# Patient Record
Sex: Female | Born: 1950 | Race: White | Hispanic: No | Marital: Married | Smoking: Never smoker
Health system: Southern US, Community
[De-identification: ages and names within clinical notes are randomized; demographics above are authoritative.]

## PROBLEM LIST (undated history)

## (undated) DIAGNOSIS — G4730 Sleep apnea, unspecified: Secondary | ICD-10-CM

## (undated) DIAGNOSIS — M1990 Unspecified osteoarthritis, unspecified site: Secondary | ICD-10-CM

## (undated) DIAGNOSIS — R51 Headache: Secondary | ICD-10-CM

## (undated) DIAGNOSIS — I10 Essential (primary) hypertension: Secondary | ICD-10-CM

## (undated) DIAGNOSIS — R519 Headache, unspecified: Secondary | ICD-10-CM

## (undated) DIAGNOSIS — Z8619 Personal history of other infectious and parasitic diseases: Secondary | ICD-10-CM

## (undated) HISTORY — PX: ESOPHAGOGASTRODUODENOSCOPY: SHX1529

## (undated) HISTORY — PX: OTHER SURGICAL HISTORY: SHX169

## (undated) HISTORY — PX: COLONOSCOPY: SHX174

---

## 2005-02-21 ENCOUNTER — Ambulatory Visit: Payer: Self-pay | Admitting: General Practice

## 2005-12-04 ENCOUNTER — Ambulatory Visit: Payer: Self-pay

## 2006-01-10 ENCOUNTER — Ambulatory Visit: Payer: Self-pay | Admitting: Unknown Physician Specialty

## 2006-05-07 ENCOUNTER — Ambulatory Visit: Payer: Self-pay | Admitting: Internal Medicine

## 2006-12-08 ENCOUNTER — Ambulatory Visit: Payer: Self-pay | Admitting: Internal Medicine

## 2006-12-22 ENCOUNTER — Ambulatory Visit: Payer: Self-pay | Admitting: Internal Medicine

## 2007-08-11 ENCOUNTER — Ambulatory Visit: Payer: Self-pay | Admitting: Internal Medicine

## 2007-08-18 ENCOUNTER — Ambulatory Visit: Payer: Self-pay | Admitting: Internal Medicine

## 2007-09-16 ENCOUNTER — Encounter: Payer: Self-pay | Admitting: Neurosurgery

## 2007-10-02 ENCOUNTER — Ambulatory Visit: Payer: Self-pay | Admitting: Unknown Physician Specialty

## 2007-10-11 ENCOUNTER — Encounter: Payer: Self-pay | Admitting: Neurosurgery

## 2007-12-24 ENCOUNTER — Ambulatory Visit: Payer: Self-pay | Admitting: Internal Medicine

## 2008-12-27 ENCOUNTER — Ambulatory Visit: Payer: Self-pay | Admitting: Internal Medicine

## 2009-06-27 ENCOUNTER — Ambulatory Visit: Payer: Self-pay | Admitting: Family Medicine

## 2009-11-01 ENCOUNTER — Ambulatory Visit: Payer: Self-pay

## 2009-12-08 ENCOUNTER — Ambulatory Visit: Payer: Self-pay | Admitting: General Practice

## 2009-12-22 ENCOUNTER — Ambulatory Visit: Payer: Self-pay | Admitting: General Practice

## 2010-02-13 ENCOUNTER — Ambulatory Visit: Payer: Self-pay | Admitting: Internal Medicine

## 2011-02-18 ENCOUNTER — Ambulatory Visit: Payer: Self-pay | Admitting: Internal Medicine

## 2012-02-19 ENCOUNTER — Ambulatory Visit: Payer: Self-pay | Admitting: Internal Medicine

## 2013-02-04 ENCOUNTER — Ambulatory Visit: Payer: Self-pay | Admitting: Orthopedic Surgery

## 2013-02-23 ENCOUNTER — Ambulatory Visit: Payer: Self-pay

## 2013-03-10 ENCOUNTER — Ambulatory Visit: Payer: Self-pay | Admitting: General Practice

## 2014-02-24 ENCOUNTER — Ambulatory Visit: Payer: Self-pay | Admitting: Internal Medicine

## 2014-06-21 ENCOUNTER — Ambulatory Visit (INDEPENDENT_AMBULATORY_CARE_PROVIDER_SITE_OTHER): Payer: BC Managed Care – PPO | Admitting: Podiatry

## 2014-06-21 ENCOUNTER — Encounter: Payer: Self-pay | Admitting: Podiatry

## 2014-06-21 VITALS — BP 137/81 | HR 80 | Resp 16 | Ht 63.0 in | Wt 199.0 lb

## 2014-06-21 DIAGNOSIS — L600 Ingrowing nail: Secondary | ICD-10-CM

## 2014-06-21 DIAGNOSIS — B351 Tinea unguium: Secondary | ICD-10-CM

## 2014-06-21 NOTE — Progress Notes (Signed)
   Subjective:    Patient ID: Ashley Mckay, female    DOB: [birth year], 63 y.o.   MRN: [id number]  HPI Comments: Ashley Mckay, 63 year old female, presents the office they with complaints of right great toenail pain on her left foot. She states it is his been present for many years. She states that she's been treated by her primary care physician for onychomycosis for which she is almost finished with her third month of Lamisil. She is no significance while on the Lamisil. She denies any redness or drainage from the nail site. She pain on the inside aspect of the toenail. No other complaints at this time. Denies any recent injury or trauma to the area.     Review of Systems  All other systems reviewed and are negative.      Objective:   Physical Exam AAO x3, NAD DP/PT pulses palpable bilaterally, CRT less than 3 seconds Protective sensation intact with Simms Weinstein monofilament, vibratory sensation intact, Achilles tendon reflex intact. Tenderness to palpation of the medial aspect of the left hallux toenail mostly on the distal aspect of the nail. There is mild incurvation of the medial nail border. There is no surrounding erythema or a sitting cellulitis. There is no drainage or purulence identified. There is no clinical signs of infection. Nails appear to be slightly hypertrophic, brittle and yellow discoloration along the distal aspect of the nail however there is clearing on the proximal aspect. No open lesions or pre-ulcerative lesions. No calf pain, swelling, warmth. MMT 5/5, ROM WNL       Assessment & Plan:  63 year old female with left medial border onychocryptosis and onychomycosis. -Treatment options discussed including alternatives, risks, complications. -At this time the patient elects to proceed with a slant back procedure to debride the ingrowing nail. The nail was then debrided to patient comfort without complications. Discussed the patient if this becomes symptomatic again  or if she notices any redness, drainage or other clinical signs of infection to call the office. We discussed doing a partial nail avulsion with or without chemical matricectomy. She we'll consider this at the symptoms continue. Monitor for any clinical signs or symptoms of infection and directed to call the office immediately if any are to occur or go directly to the emergency room. -Followup as needed. Call the office with any questions, concerns, change in symptoms.

## 2014-06-21 NOTE — Patient Instructions (Signed)
Ingrown Toenail An ingrown toenail occurs when the sharp edge of your toenail grows into the skin. Causes of ingrown toenails include toenails clipped too far back or poorly fitting shoes. Activities involving sudden stops (basketball, tennis) causing "toe jamming" may lead to an ingrown nail. HOME CARE INSTRUCTIONS   Soak the whole foot in warm soapy water for 20 minutes, 3 times per day.  You may lift the edge of the nail away from the sore skin by wedging a small piece of cotton under the corner of the nail. Be careful not to dig (traumatize) and cause more injury to the area.  Wear shoes that fit well. While the ingrown nail is causing problems, sandals may be beneficial.  Trim your toenails regularly and carefully. Cut your toenails straight across, not in a curve. This will prevent injury to the skin at the corners of the toenail.  Keep your feet clean and dry.  Crutches may be helpful early in treatment if walking is painful.  Antibiotics, if prescribed, should be taken as directed.  Return for a wound check in 2 days or as directed.  Only take over-the-counter or prescription medicines for pain, discomfort, or fever as directed by your caregiver. SEEK IMMEDIATE MEDICAL CARE IF:   You have a fever.  You have increasing pain, redness, swelling, or heat at the wound site.  Your toe is not better in 7 days. If conservative treatment is not successful, surgical removal of a portion or all of the nail may be necessary. MAKE SURE YOU:   Understand these instructions.  Will watch your condition.  Will get help right away if you are not doing well or get worse. Document Released: 08/23/2000 Document Revised: 11/18/2011 Document Reviewed: 08/17/2008 [employer] Patient Information 2015 [employer]. This information is not intended to replace advice given to you by your health care provider. Make sure you discuss any questions you have with your health care provider.  

## 2014-08-25 ENCOUNTER — Encounter: Payer: Self-pay | Admitting: Podiatry

## 2014-08-25 ENCOUNTER — Ambulatory Visit (INDEPENDENT_AMBULATORY_CARE_PROVIDER_SITE_OTHER): Payer: BC Managed Care – PPO | Admitting: Podiatry

## 2014-08-25 VITALS — BP 127/78 | HR 83 | Resp 16

## 2014-08-25 DIAGNOSIS — IMO0002 Reserved for concepts with insufficient information to code with codable children: Secondary | ICD-10-CM | POA: Insufficient documentation

## 2014-08-25 DIAGNOSIS — L03012 Cellulitis of left finger: Secondary | ICD-10-CM

## 2014-08-25 MED ORDER — CLINDAMYCIN HCL 300 MG PO CAPS: 300.0000 mg | ORAL_CAPSULE | Freq: Three times a day (TID) | ORAL | Status: DC

## 2014-08-25 NOTE — Patient Instructions (Signed)

## 2014-08-25 NOTE — Progress Notes (Signed)
Patient ID: Ashley Mckay, female   DOB: 12-28-1950, 63 y.o.   MRN: [id number]  Subjective: 63 year old female returns the office today for complaints of left big toe nail pain. She states that the couple days ago the area became red and she was having pain over the toenail. She denies any drainage from around the nail. She states that she has pain particularly with certain shoes and pressure over the area. The patient previously had finished a course of Lamisil and is inquiring if she should go back on it. She denies any systemic complaints as fevers, chills, nausea, vomiting. No acute changes his last appointment and no other complaints at this time.  Objective: AAO 3, NAD DP/PT pulses palpable bilaterally, CRT less than 3 seconds Protective sensation intact with Simms Weinstein monofilament, vibratory sensation intact, Achilles tendon reflex intact. There is tenderness around the medial aspect left hallux toenail with incurvation on the medial nail border. There is mild edema overlying the nail border. There is no surrounding erythema or ascending cellulitis. There is no drainage or purulence identified however see procedure note below. (Upon the procedure there was some purulence identified). Nail is hypertrophic, dystrophic, elongated, brittle distally however there some evidence of clearing along the proximal nail borders. There is slight discoloration the remaining toenails however there is no surrounding erythema or signs of infection or pain associated with the toenails.  No open lesions or pre-ulcerative lesions.  No areas of pinpoint bony tenderness or pain with vibratory sensation.  No pain with calf compression, swelling, warmth, erythema.   Assessment : 63 year old female with left medial hallux ingrown toenail/paronychia   Plan: -Treatment options were discussed with the patient including alternatives, risks, complications.  -At this time due to the discomfort recommended partial nail  avulsion. Verbal consent was obtained. Under sterile conditions a total of 2 mL of a one-to-one mixture of 2% lidocaine plain and 0.5% Marcaine plain was infiltrated in a hallux block fashion on the left hallux. Once anesthetized the skin was then prepped in sterile fashion. Next the medial border of the left hallux nail was then sharply excised inserts remove the entire offending nail border. There was found to be an extensive amount of ingrowing along the medial nail border. Also during the procedure there was purulence identified. Once the nail was removed and the area was debrided the underlying skin was intact and no further purulence was noted. Due to the pus, we'll hold off on chemical matricectomy at this time. The area was then irrigated. Silvadene was applied followed by dry sterile dressing. After application of the dressing the tourniquet was removed and there is found to be an immediate capillary refill time to the digit. Patient tolerated the procedure well without any complications. Post procedure instructions were discussed the patient for which she verbally understood. Prescribed clindamycin. Monitor for any signs or symptoms of worsening infection and directed to call the office immediately should any occur or go directly to the emergency room. Follow up in 1 week for nail check or sooner if any palms are to arise. In the meantime, call the office with any questions, concerns, change in symptoms.

## 2014-08-26 ENCOUNTER — Telehealth: Payer: Self-pay | Admitting: *Deleted

## 2014-08-26 ENCOUNTER — Encounter: Payer: Self-pay | Admitting: Podiatry

## 2014-08-26 NOTE — Telephone Encounter (Signed)
Pt states she needs a note for today.

## 2014-09-06 ENCOUNTER — Ambulatory Visit (INDEPENDENT_AMBULATORY_CARE_PROVIDER_SITE_OTHER): Payer: BC Managed Care – PPO | Admitting: Podiatry

## 2014-09-06 VITALS — BP 110/71 | HR 76 | Resp 16

## 2014-09-06 DIAGNOSIS — L03012 Cellulitis of left finger: Secondary | ICD-10-CM

## 2014-09-06 DIAGNOSIS — L03039 Cellulitis of unspecified toe: Secondary | ICD-10-CM

## 2014-09-06 NOTE — Progress Notes (Signed)
Patient ID: Ashley Mckay, female   DOB: 11/27/1950, 63 y.o.   MRN: [id number]  Subjective: 63 year old female returns the office status post left medial hallux partial nail avulsion secondary to paronychia. Patient states that the area is doing well and she has no complaints. The patient states that she has continue soaking the foot twice a day Epson salt covering with antibiotic ointment and a Band-Aid except for today. She denies any drainage or purulence. Denies any pain associated with the area. No streaking. Denies any systemic complaints as fevers, chills, nausea, vomiting. No other complaints at this time in no acute changes since last appointment.  Objective: AAO 3, NAD DP/PT pulses palpable, CRT less than 3 seconds Protective sensation intact with Simms Weinstein monofilament, vibratory sensation intact, Achilles tendon reflex intact Status post left medial hallux partial nail avulsion which is healing well for this timeframe. There is granulation tissue within the medial nail border. There is localized erythema directly over the medial nail border however there is no increase in edema, warmth or ascending cellulitis. There is no drainage or purulence identified. There is no tenderness over the site. No other open lesions or pre-ulcerative lesions. No pain with calf compression, swelling, warmth, erythema.  Assessment: 63 year old female status post left medial hallux partial nail avulsion secondary to paronychia, healing  Plan: -Treatment options discussed the patient including alternatives, risks, complications -Recommended continue soaking the foot twice a day Epson salts followed by antibiotic ointment and a Band-Aid. Can leave the area uncovered at night. Recommended he continue this until the area has completely healed. Continue to monitor for any clinical signs or symptoms of worsening infection and directed to call the office immediately should any occur or go to the emergency  room. -Follow-up in 2 weeks if the area has not healed or symptomatic or sooner should there be any change or worsening of symptoms. In the meantime, call the office with any questions, concerns, change in symptoms.

## 2014-09-06 NOTE — Patient Instructions (Signed)
Continue soaking in epsom salts twice a day followed by antibiotic ointment and a band-aid. Continue this until completely healed.  Follow up in 2 weeks if not completely healed, or sooner if needed. Monitor for any signs/symptoms of infection. Call the office immediately if any occur or go directly to the emergency room. Call with any questions/concerns.

## 2014-12-30 NOTE — Op Note (Signed)
PATIENT NAME:  Ashley Mckay, MARES MR#:  [id number] DATE OF BIRTH:  [birth year]  DATE OF PROCEDURE:  03/10/2013  PREOPERATIVE DIAGNOSIS: Internal derangement of the left knee.   POSTOPERATIVE DIAGNOSES: Tear of the posterior horn of medial meniscus, left knee.   PROCEDURE PERFORMED: Left knee arthroscopy and partial medial meniscectomy.   SURGEON: Laurice Record. Holley Bouche., M.D.   ANESTHESIA: General.   ESTIMATED BLOOD LOSS: Minimal.   FLUIDS REPLACED: 900 mL of crystalloid.   DRAINS: None.   INDICATIONS FOR SURGERY: The patient is a 64 year old female who has been seen for complaints of left knee pain. MRI demonstrated findings consistent with meniscal pathology. After discussion of the risks and benefits of surgical intervention, the patient expressed understanding of the risks, benefits and agreed with plans for surgical intervention.   PROCEDURE IN DETAIL: The patient was brought into the operating room and, after adequate general anesthesia was achieved, a tourniquet was placed on the patient's left thigh and the leg was placed in a leg holder. All bony prominences were well padded. The patient's left knee and leg were cleaned and prepped with alcohol and DuraPrep and draped in the usual sterile fashion. A "timeout" was performed as per usual protocol. The anticipated portal sites were injected with 0.25% Marcaine with epinephrine. An anterolateral portal was created, and a cannula was inserted. A small effusion was evacuated. The scope was inserted, and the knee was distended with fluid using the Stryker pump. The scope was advanced down the medial gutter and into the medial compartment of the knee. Under visualization with the scope, an anteromedial portal was created, and hook probe was inserted. Inspection of the medial compartment showed the articular surface to be in good condition. 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 The anterior horn of the medial  meniscus was visualized and probed and felt to be stable. There was a complex tear involving the posterior horn of the medial meniscus. The tear was debrided using meniscal punches and a 4.5 mm shaver. Final contouring was performed using a 50-degree ArthroCare wand. The remaining rim of the meniscus was visualized and probed and felt to be stable. The scope was then advanced into the intracondylar region. The anterior cruciate ligament was visualized and probed and felt to be stable. The scope was removed from the anterolateral portal and reinserted via the anteromedial portal so as to better visualize the lateral compartment. The articular surface was in excellent condition. The lateral meniscus was visualized and probed and felt to be stable. Finally, the scope was positioned so as to visualize the patellofemoral articulation. Good patellar tracking was noted. The articular surface was in good condition.   The knee was irrigated with copious amounts of fluid and then suctioned dry. The anterolateral portal was reapproximated using #3-0 nylon. A combination of 0.25% Marcaine with epinephrine and 4 mg of morphine was injected via the scope. The scope was removed, and the anteromedial portal was reapproximated using #3-0 nylon. A sterile dressing was applied followed by application of an ice wrap.   The patient tolerated the procedure well. She was transported to the recovery room in stable condition.   ____________________________ Laurice Record. Holley Bouche., MD jph:gb D: 03/10/2013 20:33:48 ET T: 03/11/2013 03:12:13 ET JOB#: [id number]  cc: Laurice Record. Holley Bouche., MD, <Dictator> JAMES P Holley Bouche MD ELECTRONICALLY SIGNED 03/12/2013 18:00

## 2015-05-09 ENCOUNTER — Encounter: Payer: Self-pay | Admitting: Podiatry

## 2015-05-09 ENCOUNTER — Ambulatory Visit (INDEPENDENT_AMBULATORY_CARE_PROVIDER_SITE_OTHER): Payer: BLUE CROSS/BLUE SHIELD | Admitting: Podiatry

## 2015-05-09 VITALS — BP 117/73 | HR 75 | Resp 17

## 2015-05-09 DIAGNOSIS — M79676 Pain in unspecified toe(s): Secondary | ICD-10-CM | POA: Diagnosis not present

## 2015-05-09 DIAGNOSIS — L600 Ingrowing nail: Secondary | ICD-10-CM

## 2015-05-09 DIAGNOSIS — B351 Tinea unguium: Secondary | ICD-10-CM | POA: Diagnosis not present

## 2015-05-09 NOTE — Patient Instructions (Signed)
Soak Instructions    THE DAY AFTER THE PROCEDURE  Place 1/4 cup of epsom salts in a quart of warm tap water.  Submerge your foot or feet with outer bandage intact for the initial soak; this will allow the bandage to become moist and wet for easy lift off.  Once you remove your bandage, continue to soak in the solution for 20 minutes.  This soak should be done twice a day.  Next, remove your foot or feet from solution, blot dry the affected area and cover.  You may use a band aid large enough to cover the area or use gauze and tape.  Apply other medications to the area as directed by the doctor such as polysporin neosporin.  IF YOUR SKIN BECOMES IRRITATED WHILE USING THESE INSTRUCTIONS, IT IS OKAY TO SWITCH TO  WHITE VINEGAR AND WATER. Or you may use antibacterial soap and water to keep the toe clean  Monitor for any signs/symptoms of infection. Call the office immediately if any occur or go directly to the emergency room. Call with any questions/concerns.  After one week you can start to leave the area uncovered at night and around the house.

## 2015-05-10 NOTE — Progress Notes (Signed)
Patient ID: Ashley Mckay, female   DOB: [birth year], 64 y.o.   MRN: [id number]  Subjective: 64 year old female presents the office today for pain to the lateral border of the left hallux toenail. She states that over the last couple weeks she has noticed some small amount of swelling and redness tripping on the nail border. Denies any red streaks. She denies any drainage or purulence in the nail border. Chest is states that she is running some recurrence of some pain along the medial nail border although it is mild. Denies any redness or drainage or swelling on the nail border. The right hallux toenail occasionally becomes uncomfortable however this trimmed during pedicures which seems to alleviate his symptoms. Denies any redness or drainage at around that site either. No other complaints at this time in no acute changes since last appointment.  Objective: AAO x3, NAD DP/PT pulses palpable bilaterally, CRT less than 3 seconds Protective sensation intact with Simms Weinstein monofilament, vibratory sensation intact On the left lateral hallux toenail border there is localized edema and erythema. There is evidence of incurvation along the nail border there is tenderness palpation around the area. There is no drainage. There is a small mallet ingrown along the medial nail border with some mild tenderness to the area. There is no drainage or purulence from the nail and there is no ascending cellulitis. No fluctuance or crepitus or malodor. There is mild incurvation along the right medial hallux toenail border with tenderness on the very distal aspect of the nail. There is no surrounding erythema or drainage. No otherareas of tenderness to bilateral lower extremities. MMT 5/5, ROM WNL.  No open lesions or pre-ulcerative lesions.  No overlying edema, erythema, increase in warmth to bilateral lower extremities.  No pain with calf compression, swelling, warmth, erythema bilaterally.   Assessment: 64 year old  female left hallux symptomatic ingrown toenail  Plan: -Treatment options discussed including all alternatives, risks, and complications -At this time, the patient is requesting partial nail removal with chemical matricectomy to the symptomatic portion of the nail on the left hallux. She has significant proceed with both the medial and lateral nail borders. Risks and complications were discussed with the patient for which they understand and  verbally consent to the procedure. Under sterile conditions a total of 3 mL of a mixture of 2% lidocaine plain and 0.5% Marcaine plain was infiltrated in a hallux block fashion. Once anesthetized, the hallux was prepped in sterile fashion. A tourniquet was then applied. Next the medial and lateral aspects of hallux nail border was then sharply excised making sure to remove the entire offending nail border. Once the nails were ensured to be removed area was debrided and the underlying skin was intact. There is no purulence identified in the procedure. Next phenol was then applied under standard conditions and copiously irrigated. Silvadene was applied. A dry sterile dressing was applied. After application of the dressing the tourniquet was removed and there is found to be an immediate capillary refill time to the digit. The patient tolerated the procedure well any complications. Post procedure instructions were discussed the patient for which he verbally understood. Follow-up in one week for nail check or sooner if any problems are to arise. Discussed signs/symptoms of infection and directed to call the office immediately should any occur or go directly to the emergency room. In the meantime, encouraged to call the office with any questions, concerns, changes symptoms. -Right hallux nail was debrided without complication/bleeding. -left hallux nail was sent  for biopsy to evaluate for onychomycosis. The left hallux nails hypertrophic, dystrophic, discolored.  Celesta Gentile, DPM

## 2015-05-23 ENCOUNTER — Encounter: Payer: Self-pay | Admitting: Podiatry

## 2015-05-23 ENCOUNTER — Ambulatory Visit (INDEPENDENT_AMBULATORY_CARE_PROVIDER_SITE_OTHER): Payer: BLUE CROSS/BLUE SHIELD | Admitting: Podiatry

## 2015-05-23 VITALS — BP 119/76 | HR 85 | Resp 16

## 2015-05-23 DIAGNOSIS — Z9889 Other specified postprocedural states: Secondary | ICD-10-CM

## 2015-05-23 DIAGNOSIS — L03031 Cellulitis of right toe: Secondary | ICD-10-CM

## 2015-05-23 DIAGNOSIS — M79673 Pain in unspecified foot: Secondary | ICD-10-CM

## 2015-05-23 DIAGNOSIS — L03012 Cellulitis of left finger: Secondary | ICD-10-CM

## 2015-05-23 NOTE — Progress Notes (Signed)
Patient ID: Ashley Mckay, female   DOB: Dec 12, 1950, 64 y.o.   MRN: [id number]  Subjective: 64 year old female presents the office they for followup evaluation status post left hallux partial nail avulsions with chemical matricectomy. She said overall she is doing well and she has no pain to the area. She is discontinue soaking her feet Epson salts of the areas healed. She does keep antibiotic ointment and a Band-Aid overlying the area. She denies redness any redness streaks. No drainage or purulence. She was also to purchase power steps today to help prevent foot problems. His foot pain particularly however she gets tired feeling to her feet the end of day after standing all day.  Objective: AAO X3, NAD Neurovascular status intact and unchanged. Status post left medial/lateral hallux partial nail avulsion which is healing well. Scabs from the nailbed. There is no tenderness to palpation over the area. There is no ascending erythema, a sitting cellulitis, fluctuance, crepitus, malodor, drainage. There is no area pinpoint bony tenderness or pain the vibratory sensation. There is no overlying edema, erythema, increase in warmth. No open lesions or pre-ulcer lesions otherwise. No pain with calf compression, swelling, warmth, erythema.  Assessment: 64 year old female of healing partial nail avulsion with chemical matricectomy  Plan: -Treatment options discussed including all alternatives, risks, and complications -Recommend continued antibiotic ointment and a Band-Aid over the area while wearing shoes and socks. Can leave uncovered at night. Monitor for any signs or symptoms of infection and directed to call the office should any occur. Monitor for any recurrence. -She purchase power subsidize to help her generalized foot fatigue. He does not seem to alleviate her symptoms to call the office. -Followup as needed. Call the office with any questions, concerns, change in symptoms in the  meantime.  Celesta Gentile, DPM

## 2015-06-01 ENCOUNTER — Ambulatory Visit (INDEPENDENT_AMBULATORY_CARE_PROVIDER_SITE_OTHER): Payer: BLUE CROSS/BLUE SHIELD | Admitting: Podiatry

## 2015-06-01 ENCOUNTER — Encounter: Payer: Self-pay | Admitting: Podiatry

## 2015-06-01 DIAGNOSIS — B351 Tinea unguium: Secondary | ICD-10-CM

## 2015-06-01 MED ORDER — UREA 47 % EX CREA: 1.0000 "application " | TOPICAL_CREAM | Freq: Every day | CUTANEOUS | Status: DC

## 2015-06-01 MED ORDER — TERBINAFINE HCL 250 MG PO TABS: 250.0000 mg | ORAL_TABLET | Freq: Every day | ORAL | Status: DC

## 2015-06-02 NOTE — Progress Notes (Signed)
Patient ID: Thermon Leyland, female   DOB: 1951/04/05, 64 y.o.   MRN: [id number]  Subjective: 64 year old female presents the office today to discuss nail biopsy results. She continues to the pain to the toenails and denies any surrounding redness or drainage. She's had no problems after the partial nail avulsion. She denies a drainage or pus. No other complaints this time in no acute changes.  Objective: AAO 3, NAD Neurovascular status intact and unchanged Nails slightly dystrophic, discolored, and mildly hypertrophic particular the hallux toenail on the left side. There is no tenderness palpation. The procedure site is well-healed. There is no swelling erythema or drainage. No areas of tenderness to bilateral lower chemise. No overlying edema, erythema, increase in warmth. No open lesions or pre-ulcerative lesions. No pain with calf compression, swelling, warmth, erythema.  Assessment: 64 year old female possible onychomycosis.  Nail biopsy results were discussed the patient which revealed chronic microtrauma and possible onychomycosis. She wishes to hold off on nail fungus treatment this time. Discussed there. Cream to the toenail. Should proceed with this. Urea 47% was prescribed for her. Application suction were discussed. Follow-up with the symptoms fail to resolve in the next 3 months or sooner if any problems are to arise. Call any question, concerns, change in symptoms in the meantime.  Celesta Gentile, DPM

## 2015-09-07 ENCOUNTER — Ambulatory Visit (INDEPENDENT_AMBULATORY_CARE_PROVIDER_SITE_OTHER): Payer: BLUE CROSS/BLUE SHIELD | Admitting: Podiatry

## 2015-09-07 ENCOUNTER — Ambulatory Visit (INDEPENDENT_AMBULATORY_CARE_PROVIDER_SITE_OTHER): Payer: BLUE CROSS/BLUE SHIELD

## 2015-09-07 ENCOUNTER — Encounter: Payer: Self-pay | Admitting: Podiatry

## 2015-09-07 VITALS — BP 139/76 | HR 88 | Resp 18

## 2015-09-07 DIAGNOSIS — M722 Plantar fascial fibromatosis: Secondary | ICD-10-CM | POA: Diagnosis not present

## 2015-09-07 MED ORDER — MELOXICAM 7.5 MG PO TABS: 7.5000 mg | ORAL_TABLET | Freq: Every day | ORAL | Status: DC

## 2015-09-07 NOTE — Patient Instructions (Signed)

## 2015-09-11 DIAGNOSIS — M722 Plantar fascial fibromatosis: Secondary | ICD-10-CM | POA: Insufficient documentation

## 2015-09-11 NOTE — Progress Notes (Signed)
Patient ID: Ashley Mckay, female   DOB: January 06, 1951, 65 y.o.   MRN: [id number]  Subjective: 65 year old female presents the office with concerns of right heel pain which has been ongoing for approximately 3 months. She said that she has pain in the morning when she first gets up or after periods of rest. Her pain is relieved with ambulation. She has been icing as well as using ibuprofen which seems to help. She denies any recent injury or trauma. No swelling or redness. No tingling or numbness. The pain does not wake her up at night. Denies any systemic complaints such as fevers, chills, nausea, vomiting. No acute changes since last appointment, and no other complaints at this time.   Objective: AAO x3, NAD DP/PT pulses palpable bilaterally, CRT less than 3 seconds Protective sensation intact with Simms Weinstein monofilament, vibratory sensation intact, Achilles tendon reflex intact Tenderness to palpation along the plantar medial tubercle of the calcaneus at the insertion of plantar fascia on the right foot. There is no pain along the course of the plantar fascia within the arch of the foot. Plantar fascia appears to be intact. There is no pain with lateral compression of the calcaneus or pain with vibratory sensation. There is no pain along the course or insertion of the achilles tendon. No other areas of tenderness to bilateral lower extremities.  MMT 5/5, ROM WNL. No edema, erythema, increase in warmth to bilateral lower extremities.  No open lesions or pre-ulcerative lesions.  No pain with calf compression, swelling, warmth, erythema  Assessment: Right heel pain, plantar fasciitis  Plan: -All treatment options discussed with the patient including all alternatives, risks, complications.  -Patient elects to proceed with steroid injection into the right heel. Under sterile skin preparation, a total of 2.5cc of kenalog 10, 0.5% Marcaine plain, and 2% lidocaine plain were infiltrated into the  symptomatic area without complication. A band-aid was applied. Patient tolerated the injection well without complication. Post-injection care with discussed with the patient. Discussed with the patient to ice the area over the next couple of days to help prevent a steroid flare.  -Plantar fascial brace -Prescribed mobic. Discussed side effects of the medication and directed to stop if any are to occur and call the office.  -Stretching and icing daily -Follow-up in 3 weeks or sooner if any problems arise. In the meantime, encouraged to call the office with any questions, concerns, change in symptoms.   Celesta Gentile, DPM

## 2015-10-10 ENCOUNTER — Ambulatory Visit (INDEPENDENT_AMBULATORY_CARE_PROVIDER_SITE_OTHER): Payer: BLUE CROSS/BLUE SHIELD | Admitting: Podiatry

## 2015-10-10 ENCOUNTER — Encounter: Payer: Self-pay | Admitting: Podiatry

## 2015-10-10 DIAGNOSIS — M722 Plantar fascial fibromatosis: Secondary | ICD-10-CM | POA: Diagnosis not present

## 2015-10-10 NOTE — Progress Notes (Signed)
Patient ID: Ashley Mckay, female   DOB: Mar 15, 1951, 65 y.o.   MRN: [id number]  Subjective: Ashley Mckay presents to the office today for follow-up evaluation of right heel pain. She states that she has noticed improvement in her symptoms although she does continue gets some pain to the right heel mostly after standing and working all day. She says the pain is localized to the bottom of the heel. She has been wearing  Supportive sandals around the house which seems to help. She is also change her shoes which helps. She is asking about various sneakers today. No recent injury or trauma.  They have been icing, stretching, try to wear supportive shoe as much as possible.  No other complaints at this time. No acute changes since last appointment. They deny any systemic complaints such as fevers, chills, nausea, vomiting.  Objective: General: AAO x3, NAD  Dermatological: Skin is warm, dry and supple bilateral. Nails x 10 are well manicured; remaining integument appears unremarkable at this time. There are no open sores, no preulcerative lesions, no rash or signs of infection present.  Vascular: Dorsalis Pedis artery and Posterior Tibial artery pedal pulses are 2/4 bilateral with immedate capillary fill time. Pedal hair growth present. There is no pain with calf compression, swelling, warmth, erythema.   Neruologic: Grossly intact via light touch bilateral. Vibratory intact via tuning fork bilateral. Protective threshold with Semmes Wienstein monofilament intact to all pedal sites bilateral.   Musculoskeletal: There is mild but continued tenderness palpation along the plantar medial tubercle of the calcaneus at the insertion of the plantar fascia on the right foot. There is no pain along the course of the plantar fascia within the arch of the foot. Plantar fascia appears to be intact bilaterally. There is no pain with lateral compression of the calcaneus and there is no pain with vibratory sensation. There is  no pain along the course or insertion of the Achilles tendon. There are no other areas of tenderness to bilateral lower extremities. No gross boney pedal deformities bilateral. No pain, crepitus, or limitation noted with foot and ankle range of motion bilateral. Muscular strength 5/5 in all groups tested bilateral. Equinus is present.   Gait: Unassisted, Nonantalgic.   Assessment: Presents for follow-up evaluation for heel pain, likely plantar fasciitis   Plan: -Treatment options discussed including all alternatives, risks, and complications -Discussed steroid injection however she wishes to hold off.  -Continue anti-inflammatories as needed  -Discussed shoe brand changes. She has tried multiple orthotics and the seem to hurt her foot more and she wishes to hold off on custom.  -Dispensed night splint.  -Continue plantar fascial brace.  -Ice and stretching exercises on a daily basis. -Follow-up in 4 weeks or sooner if any problems arise. In the meantime, encouraged to call the office with any questions, concerns, change in symptoms.   Celesta Gentile, DPM

## 2015-11-07 ENCOUNTER — Ambulatory Visit: Payer: BLUE CROSS/BLUE SHIELD | Admitting: Podiatry

## 2016-01-30 ENCOUNTER — Encounter: Payer: Self-pay | Admitting: *Deleted

## 2016-01-31 ENCOUNTER — Encounter: Payer: Self-pay | Admitting: *Deleted

## 2016-01-31 ENCOUNTER — Encounter
Admission: RE | Disposition: A | Discharge status: Home / Self Care | Payer: Self-pay | Source: Ambulatory Visit | Attending: Unknown Physician Specialty

## 2016-01-31 ENCOUNTER — Ambulatory Visit
Admission: RE | Admit: 2016-01-31 | Discharge: 2016-01-31 | Disposition: A | Discharge status: Home / Self Care | Payer: Commercial Managed Care - HMO | Source: Ambulatory Visit | Attending: Unknown Physician Specialty | Admitting: Unknown Physician Specialty

## 2016-01-31 ENCOUNTER — Ambulatory Visit: Payer: Commercial Managed Care - HMO | Admitting: Certified Registered Nurse Anesthetist

## 2016-01-31 DIAGNOSIS — K640 First degree hemorrhoids: Secondary | ICD-10-CM | POA: Diagnosis not present

## 2016-01-31 DIAGNOSIS — Z1211 Encounter for screening for malignant neoplasm of colon: Secondary | ICD-10-CM | POA: Diagnosis not present

## 2016-01-31 DIAGNOSIS — Z79899 Other long term (current) drug therapy: Secondary | ICD-10-CM | POA: Diagnosis not present

## 2016-01-31 DIAGNOSIS — Z9989 Dependence on other enabling machines and devices: Secondary | ICD-10-CM | POA: Insufficient documentation

## 2016-01-31 DIAGNOSIS — I10 Essential (primary) hypertension: Secondary | ICD-10-CM | POA: Insufficient documentation

## 2016-01-31 DIAGNOSIS — M1990 Unspecified osteoarthritis, unspecified site: Secondary | ICD-10-CM | POA: Insufficient documentation

## 2016-01-31 DIAGNOSIS — G4730 Sleep apnea, unspecified: Secondary | ICD-10-CM | POA: Insufficient documentation

## 2016-01-31 DIAGNOSIS — Z791 Long term (current) use of non-steroidal anti-inflammatories (NSAID): Secondary | ICD-10-CM | POA: Insufficient documentation

## 2016-01-31 DIAGNOSIS — K5730 Diverticulosis of large intestine without perforation or abscess without bleeding: Secondary | ICD-10-CM | POA: Diagnosis not present

## 2016-01-31 HISTORY — DX: Personal history of other infectious and parasitic diseases: Z86.19

## 2016-01-31 HISTORY — DX: Headache, unspecified: R51.9

## 2016-01-31 HISTORY — DX: Unspecified osteoarthritis, unspecified site: M19.90

## 2016-01-31 HISTORY — DX: Headache: R51

## 2016-01-31 HISTORY — PX: COLONOSCOPY WITH PROPOFOL: SHX5780

## 2016-01-31 HISTORY — DX: Essential (primary) hypertension: I10

## 2016-01-31 HISTORY — DX: Sleep apnea, unspecified: G47.30

## 2016-01-31 SURGERY — COLONOSCOPY WITH PROPOFOL
Anesthesia: General

## 2016-01-31 MED ORDER — PROPOFOL 10 MG/ML IV BOLUS
INTRAVENOUS | Status: DC | PRN
  Administered 2016-01-31: 20 mg via INTRAVENOUS
  Administered 2016-01-31: 70 mg via INTRAVENOUS
  Administered 2016-01-31: 10 mg via INTRAVENOUS

## 2016-01-31 MED ORDER — LIDOCAINE HCL (PF) 1 % IJ SOLN
INTRAMUSCULAR | Status: AC
  Administered 2016-01-31: 09:00:00
  Filled 2016-01-31: qty 2

## 2016-01-31 MED ORDER — PROPOFOL 500 MG/50ML IV EMUL
INTRAVENOUS | Status: DC | PRN
  Administered 2016-01-31: 140 ug/kg/min via INTRAVENOUS

## 2016-01-31 MED ORDER — SODIUM CHLORIDE 0.9 % IV SOLN: INTRAVENOUS | Status: DC

## 2016-01-31 MED ORDER — SODIUM CHLORIDE 0.9 % IV SOLN
INTRAVENOUS | Status: DC
  Administered 2016-01-31: 09:00:00 via INTRAVENOUS

## 2016-01-31 MED ORDER — LIDOCAINE HCL (CARDIAC) 20 MG/ML IV SOLN
INTRAVENOUS | Status: DC | PRN
  Administered 2016-01-31: 40 mg via INTRAVENOUS

## 2016-01-31 NOTE — Transfer of Care (Signed)
Immediate Anesthesia Transfer of Care Note  Patient: Ashley Mckay  Procedure(s) Performed: Procedure(s): COLONOSCOPY WITH PROPOFOL (N/A)  Patient Location: PACU  Anesthesia Type:General  Level of Consciousness: awake, alert  and oriented  Airway & Oxygen Therapy: Patient Spontanous Breathing and Patient connected to nasal cannula oxygen  Post-op Assessment: Report given to RN and Post -op Vital signs reviewed and stable  Post vital signs: Reviewed and stable  Last Vitals:  Filed Vitals:   01/31/16 0830 01/31/16 1020  BP: 145/91   Pulse: 87 78  Temp: 36.1 C 36.3 C  Resp: 18 16    Last Pain: There were no vitals filed for this visit.       Complications: No apparent anesthesia complications

## 2016-01-31 NOTE — Anesthesia Postprocedure Evaluation (Signed)
Anesthesia Post Note  Patient: Ashley Mckay  Procedure(s) Performed: Procedure(s) (LRB): COLONOSCOPY WITH PROPOFOL (N/A)  Patient location during evaluation: Endoscopy Anesthesia Type: General Level of consciousness: awake and alert Pain management: pain level controlled Vital Signs Assessment: post-procedure vital signs reviewed and stable Respiratory status: spontaneous breathing, nonlabored ventilation, respiratory function stable and patient connected to nasal cannula oxygen Cardiovascular status: blood pressure returned to baseline and stable Postop Assessment: no signs of nausea or vomiting Anesthetic complications: no    Last Vitals:  Filed Vitals:   01/31/16 1040 01/31/16 1050  BP: 106/69 106/61  Pulse: 66 70  Temp:    Resp: 15 17    Last Pain: There were no vitals filed for this visit.               Zanovia Rotz S

## 2016-01-31 NOTE — Op Note (Signed)
[facility] Patient Name: Ashley Mckay Procedure Date: 01/31/2016 9:56 AM MRN: [id number] Account #: [id number] Date of Birth: 09-Oct-1950 Admit Type: Outpatient Age: 65 Room: [facility] ENDO ROOM 4 Gender: Female Note Status: Finalized Procedure:            Colonoscopy Indications:          Screening for colorectal malignant neoplasm Providers:            Manya Silvas, MD Referring MD:         Lavera Guise, MD (Referring MD) Medicines:            Propofol per Anesthesia Complications:        No immediate complications. Procedure:            Pre-Anesthesia Assessment:                       - After reviewing the risks and benefits, the patient                        was deemed in satisfactory condition to undergo the                        procedure.                       After obtaining informed consent, the colonoscope was                        passed under direct vision. Throughout the procedure,                        the patient's blood pressure, pulse, and oxygen                        saturations were monitored continuously. The                        Colonoscope was introduced through the anus and                        advanced to the the cecum, identified by appendiceal                        orifice and ileocecal valve. The colonoscopy was                        performed without difficulty. The patient tolerated the                        procedure well. The quality of the bowel preparation                        was excellent. Findings:      A few small-mouthed diverticula were found in the sigmoid colon.      Internal hemorrhoids were found during endoscopy. The hemorrhoids were       small and Grade I (internal hemorrhoids that do not prolapse).      The exam was otherwise without abnormality. Impression:           - Diverticulosis in the sigmoid colon.                       -  Internal hemorrhoids.                       - The  examination was otherwise normal.                       - No specimens collected. Recommendation:       - Repeat colonoscopy in 10 years for screening purposes. Manya Silvas, MD 01/31/2016 10:16:50 AM This report has been signed electronically. Number of Addenda: 0 Note Initiated On: 01/31/2016 9:56 AM Scope Withdrawal Time: 0 hours 7 minutes 53 seconds  Total Procedure Duration: 0 hours 13 minutes 18 seconds       [facility]

## 2016-01-31 NOTE — Anesthesia Procedure Notes (Signed)
Performed by: Amandine Covino Pre-anesthesia Checklist: Patient identified, Emergency Drugs available, Suction available, Patient being monitored and Timeout performed Patient Re-evaluated:Patient Re-evaluated prior to inductionOxygen Delivery Method: Nasal cannula Intubation Type: IV induction       

## 2016-01-31 NOTE — Anesthesia Preprocedure Evaluation (Addendum)
Anesthesia Evaluation  Patient identified by MRN, date of birth, ID band Patient awake    Reviewed: Allergy & Precautions, NPO status , Patient's Chart, lab work & pertinent test results, reviewed documented beta blocker date and time   Airway Mallampati: III  TM Distance: >3 FB     Dental  (+) Chipped   Pulmonary sleep apnea and Continuous Positive Airway Pressure Ventilation ,           Cardiovascular hypertension, Pt. on medications and Pt. on home beta blockers      Neuro/Psych  Headaches,    GI/Hepatic   Endo/Other    Renal/GU      Musculoskeletal  (+) Arthritis ,   Abdominal   Peds  Hematology   Anesthesia Other Findings Obese. Will use CPAP tonite.  Reproductive/Obstetrics                            Anesthesia Physical Anesthesia Plan  ASA: III  Anesthesia Plan: General   Post-op Pain Management:    Induction: Intravenous  Airway Management Planned: Nasal Cannula  Additional Equipment:   Intra-op Plan:   Post-operative Plan:   Informed Consent: I have reviewed the patients History and Physical, chart, labs and discussed the procedure including the risks, benefits and alternatives for the proposed anesthesia with the patient or authorized representative who has indicated his/her understanding and acceptance.     Plan Discussed with: CRNA  Anesthesia Plan Comments:         Anesthesia Quick Evaluation

## 2016-01-31 NOTE — H&P (Signed)
   Primary Care Physician:  Lavera Guise, MD Primary Gastroenterologist:  Dr. Vira Agar  Pre-Procedure History & Physical: HPI:  Ashley Mckay is a 65 y.o. female is here for an colonoscopy.   Past Medical History  Diagnosis Date  . Sleep apnea   . History of chickenpox   . Hypertension   . Headache   . Arthritis     Past Surgical History  Procedure Laterality Date  . Esophagogastroduodenoscopy    . Colonoscopy      Prior to Admission medications   Medication Sig Start Date End Date Taking? Authorizing Provider  Calcium Carb-Cholecalciferol (CALCIUM 600 + D PO) Take by mouth daily.   Yes Historical Provider, MD  ezetimibe-simvastatin (VYTORIN) 10-40 MG per tablet Take 1 tablet by mouth daily.   Yes Historical Provider, MD  ibuprofen (ADVIL,MOTRIN) 800 MG tablet  06/14/14  Yes Historical Provider, MD  levocetirizine (XYZAL) 5 MG tablet  04/23/14  Yes Historical Provider, MD  meloxicam (MOBIC) 7.5 MG tablet Take 1 tablet (7.5 mg total) by mouth daily. 09/07/15  Yes Trula Slade, DPM  Multiple Vitamin (MULTIVITAMIN) tablet Take 1 tablet by mouth daily.   Yes Historical Provider, MD  Multiple Vitamins-Minerals (VISION FORMULA PO) Take by mouth daily.   Yes Historical Provider, MD  propranolol ER (INDERAL LA) 80 MG 24 hr capsule  04/23/14  Yes Historical Provider, MD  PRESCRIPTION MEDICATION videlle dot patch .0375 once weekly    Historical Provider, MD  Urea 47 % CREA Apply 1 application topically daily. 06/01/15   Trula Slade, DPM    Allergies as of 01/22/2016 - Review Complete 10/10/2015  Allergen Reaction Noted  . Codeine Nausea And Vomiting and Nausea Only 06/21/2014  . Tetanus toxoid adsorbed Swelling 05/09/2015  . Tetanus toxoids Swelling 06/21/2014  . Amoxicillin Other (See Comments) and Rash 06/21/2014    History reviewed. No pertinent family history.  Social History   Social History  . Marital Status: Married    Spouse Name: N/A  . Number of Children: N/A   . Years of Education: N/A   Occupational History  . Not on file.   Social History Main Topics  . Smoking status: Never Smoker   . Smokeless tobacco: Never Used  . Alcohol Use: No  . Drug Use: No  . Sexual Activity: Not on file   Other Topics Concern  . Not on file   Social History Narrative    Review of Systems: See HPI sleep apnea present, otherwise negative ROS  Physical Exam: BP 145/91 mmHg  Pulse 87  Temp(Src) 97 F (36.1 C) (Tympanic)  Resp 18  Ht 5\' 3"  (1.6 m)  Wt 88.905 kg (196 lb)  BMI 34.73 kg/m2  SpO2 97%  LMP  (LMP Unknown) General:   Alert,  pleasant and cooperative in NAD Head:  Normocephalic and atraumatic. Neck:  Supple; no masses or thyromegaly. Lungs:  Clear throughout to auscultation.    Heart:  Regular rate and rhythm. Abdomen:  Soft, nontender and nondistended. Normal bowel sounds, without guarding, and without rebound.   Neurologic:  Alert and  oriented x4;  grossly normal neurologically.  Impression/Plan: Ashley Mckay is here for an colonoscopy to be performed for screening  Risks, benefits, limitations, and alternatives regarding  colonoscopy have been reviewed with the patient.  Questions have been answered.  All parties agreeable.   Gaylyn Cheers, MD  01/31/2016, 9:54 AM

## 2016-02-02 ENCOUNTER — Encounter: Payer: Self-pay | Admitting: Unknown Physician Specialty

## 2016-12-24 ENCOUNTER — Other Ambulatory Visit: Payer: Self-pay | Admitting: Nurse Practitioner

## 2016-12-24 DIAGNOSIS — Z1231 Encounter for screening mammogram for malignant neoplasm of breast: Secondary | ICD-10-CM

## 2017-01-10 ENCOUNTER — Ambulatory Visit: Payer: 59

## 2017-03-05 ENCOUNTER — Ambulatory Visit
Admission: RE | Admit: 2017-03-05 | Discharge: 2017-03-05 | Disposition: A | Discharge status: Home / Self Care | Payer: 59 | Source: Ambulatory Visit | Attending: Nurse Practitioner | Admitting: Nurse Practitioner

## 2017-03-05 DIAGNOSIS — Z1231 Encounter for screening mammogram for malignant neoplasm of breast: Secondary | ICD-10-CM | POA: Diagnosis not present

## 2017-10-14 ENCOUNTER — Other Ambulatory Visit: Payer: Self-pay | Admitting: Nurse Practitioner

## 2017-10-14 ENCOUNTER — Other Ambulatory Visit: Payer: Self-pay

## 2017-10-14 DIAGNOSIS — N390 Urinary tract infection, site not specified: Secondary | ICD-10-CM

## 2017-10-14 MED ORDER — CIPROFLOXACIN HCL 500 MG PO TABS: 500.0000 mg | ORAL_TABLET | Freq: Two times a day (BID) | ORAL | 0 refills | Status: DC

## 2017-10-14 MED ORDER — LEVOFLOXACIN 500 MG PO TABS: 500.0000 mg | ORAL_TABLET | Freq: Every day | ORAL | 0 refills | Status: DC

## 2017-10-14 MED ORDER — PHENAZOPYRIDINE HCL 200 MG PO TABS: 200.0000 mg | ORAL_TABLET | Freq: Three times a day (TID) | ORAL | 0 refills | Status: DC | PRN

## 2017-10-14 NOTE — Progress Notes (Signed)
Sent in ciprofloxacin 500mg  bid for 10 days and pyridium 200mg  which can be taken 3 times daily when needed for bladder pain/spasms. Both sent to [facility]

## 2017-10-15 ENCOUNTER — Other Ambulatory Visit: Payer: Self-pay

## 2017-10-15 DIAGNOSIS — N390 Urinary tract infection, site not specified: Secondary | ICD-10-CM

## 2017-10-16 ENCOUNTER — Other Ambulatory Visit: Payer: Self-pay

## 2017-11-12 ENCOUNTER — Ambulatory Visit: Payer: Self-pay

## 2017-12-10 ENCOUNTER — Ambulatory Visit: Payer: BLUE CROSS/BLUE SHIELD

## 2017-12-10 DIAGNOSIS — G4733 Obstructive sleep apnea (adult) (pediatric): Secondary | ICD-10-CM

## 2017-12-10 NOTE — Progress Notes (Signed)
95 percentile pressure 6 cwp   95th percentile leak 12.8   apnea index 3.0 /hr  apnea-hypopnea index  4.2 /hr   total days used  >4 hr 95 days  total days used <4 hr 63 days  Total compliance 53 percent  Patient doing fine. She got sick in Jan. And could not wear mask. She is back using and doing fine.

## 2017-12-29 ENCOUNTER — Ambulatory Visit (INDEPENDENT_AMBULATORY_CARE_PROVIDER_SITE_OTHER): Payer: BLUE CROSS/BLUE SHIELD | Admitting: Nurse Practitioner

## 2017-12-29 ENCOUNTER — Encounter: Payer: Self-pay | Admitting: Nurse Practitioner

## 2017-12-29 VITALS — BP 137/79 | HR 76 | Resp 16 | Ht 63.0 in | Wt 211.2 lb

## 2017-12-29 DIAGNOSIS — J301 Allergic rhinitis due to pollen: Secondary | ICD-10-CM | POA: Diagnosis not present

## 2017-12-29 DIAGNOSIS — I10 Essential (primary) hypertension: Secondary | ICD-10-CM | POA: Diagnosis not present

## 2017-12-29 DIAGNOSIS — R300 Dysuria: Secondary | ICD-10-CM | POA: Diagnosis not present

## 2017-12-29 DIAGNOSIS — D485 Neoplasm of uncertain behavior of skin: Secondary | ICD-10-CM | POA: Diagnosis not present

## 2017-12-29 DIAGNOSIS — Z0001 Encounter for general adult medical examination with abnormal findings: Secondary | ICD-10-CM | POA: Insufficient documentation

## 2017-12-29 DIAGNOSIS — Z1239 Encounter for other screening for malignant neoplasm of breast: Secondary | ICD-10-CM

## 2017-12-29 DIAGNOSIS — Z1231 Encounter for screening mammogram for malignant neoplasm of breast: Secondary | ICD-10-CM

## 2017-12-29 DIAGNOSIS — N390 Urinary tract infection, site not specified: Secondary | ICD-10-CM | POA: Diagnosis not present

## 2017-12-29 DIAGNOSIS — N959 Unspecified menopausal and perimenopausal disorder: Secondary | ICD-10-CM | POA: Insufficient documentation

## 2017-12-29 DIAGNOSIS — E782 Mixed hyperlipidemia: Secondary | ICD-10-CM | POA: Insufficient documentation

## 2017-12-29 DIAGNOSIS — R319 Hematuria, unspecified: Secondary | ICD-10-CM

## 2017-12-29 LAB — POCT URINALYSIS DIPSTICK
Glucose, UA: NEGATIVE
KETONES UA: NEGATIVE
Leukocytes, UA: NEGATIVE
NITRITE UA: NEGATIVE
PH UA: 6 (ref 5.0–8.0)
PROTEIN UA: NEGATIVE
Spec Grav, UA: 1.015 (ref 1.010–1.025)
UROBILINOGEN UA: 0.2 U/dL

## 2017-12-29 MED ORDER — LEVOCETIRIZINE DIHYDROCHLORIDE 5 MG PO TABS: 5.0000 mg | ORAL_TABLET | Freq: Every evening | ORAL | 4 refills | Status: DC

## 2017-12-29 MED ORDER — ROSUVASTATIN CALCIUM 10 MG PO TABS: 10.0000 mg | ORAL_TABLET | Freq: Every day | ORAL | 4 refills | Status: DC

## 2017-12-29 MED ORDER — ESTROGENS, CONJUGATED 0.625 MG/GM VA CREA
TOPICAL_CREAM | VAGINAL | 11 refills | Status: DC
Start: 2017-12-29 — End: 2018-04-13

## 2017-12-29 MED ORDER — PROPRANOLOL HCL ER 80 MG PO CP24: 80.0000 mg | ORAL_CAPSULE | Freq: Every day | ORAL | 4 refills | Status: DC

## 2017-12-29 NOTE — Progress Notes (Signed)
[facility] [place], [place] [address]  Internal MEDICINE  Office Visit Note  Patient Name: Ashley Mckay  [id number]  [id number]  Date of Service: 12/29/2017  Chief Complaint  Patient presents with  . Urinary Tract Infection  . Annual Exam     Urinary Tract Infection   This is a recurrent problem. The current episode started in the past 7 days. The problem occurs intermittently. The problem has been unchanged. The quality of the pain is described as burning. There has been no fever. She is sexually active. There is no history of pyelonephritis. Associated symptoms include urgency. Pertinent negatives include no chills, frequency, nausea or vomiting. She has tried antibiotics for the symptoms. The treatment provided mild relief. Her past medical history is significant for recurrent UTIs.   Pt is here for routine health maintenance examination  Current Medication: Outpatient Encounter Medications as of 12/29/2017  Medication Sig Note  . ibuprofen (ADVIL,MOTRIN) 800 MG tablet  06/21/2014: Received from: External Pharmacy  . levocetirizine (XYZAL) 5 MG tablet Take 1 tablet (5 mg total) by mouth every evening.   . meloxicam (MOBIC) 7.5 MG tablet Take 1 tablet (7.5 mg total) by mouth daily.   . Multiple Vitamin (MULTIVITAMIN) tablet Take 1 tablet by mouth daily.   . Omega-3 Fatty Acids (FISH OIL) 500 MG CAPS Take by mouth.   . propranolol ER (INDERAL LA) 80 MG 24 hr capsule Take 1 capsule (80 mg total) by mouth daily.   . rosuvastatin (CRESTOR) 10 MG tablet Take 1 tablet (10 mg total) by mouth daily.   . [DISCONTINUED] levocetirizine (XYZAL) 5 MG tablet  06/21/2014: Received from: External Pharmacy  . [DISCONTINUED] PRESCRIPTION MEDICATION videlle dot patch .0375 once weekly   . [DISCONTINUED] propranolol ER (INDERAL LA) 80 MG 24 hr capsule  06/21/2014: Received from: External Pharmacy  . [DISCONTINUED] rosuvastatin (CRESTOR) 10 MG tablet Take 10 mg by mouth  daily.   Marland Kitchen conjugated estrogens (PREMARIN) vaginal cream Use I applicatorful externally twice weekly   . [DISCONTINUED] Calcium Carb-Cholecalciferol (CALCIUM 600 + D PO) Take by mouth daily.   . [DISCONTINUED] ezetimibe-simvastatin (VYTORIN) 10-40 MG per tablet Take 1 tablet by mouth daily.   . [DISCONTINUED] levofloxacin (LEVAQUIN) 500 MG tablet Take 1 tablet (500 mg total) by mouth daily.   . [DISCONTINUED] Multiple Vitamins-Minerals (VISION FORMULA PO) Take by mouth daily.   . [DISCONTINUED] phenazopyridine (PYRIDIUM) 200 MG tablet Take 1 tablet (200 mg total) by mouth 3 (three) times daily as needed for pain.   . [DISCONTINUED] Urea 47 % CREA Apply 1 application topically daily.    No facility-administered encounter medications on file as of 12/29/2017.     Surgical History: Past Surgical History:  Procedure Laterality Date  . COLONOSCOPY    . COLONOSCOPY WITH PROPOFOL N/A 01/31/2016   Procedure: COLONOSCOPY WITH PROPOFOL;  Surgeon: Manya Silvas, MD;  Location: [facility] ENDOSCOPY;  Service: Endoscopy;  Laterality: N/A;  . ESOPHAGOGASTRODUODENOSCOPY      Medical History: Past Medical History:  Diagnosis Date  . Arthritis   . Headache   . History of chickenpox   . Hypertension   . Sleep apnea     Family History: Family History  Problem Relation Age of Onset  . Breast cancer Mother 99      Review of Systems  Constitutional: Negative for activity change, chills, fatigue and unexpected weight change.  HENT: Negative for congestion, postnasal drip, rhinorrhea, sneezing, sore throat and voice change.  Eyes: Negative.  Negative for redness.  Respiratory: Positive for apnea. Negative for cough, chest tightness, shortness of breath and wheezing.   Cardiovascular: Negative for chest pain and palpitations.  Gastrointestinal: Negative for abdominal pain, constipation, diarrhea, nausea and vomiting.  Endocrine: Negative for cold intolerance, heat intolerance, polydipsia, polyphagia  and polyuria.  Genitourinary: Positive for urgency. Negative for dysuria and frequency.  Musculoskeletal: Negative for arthralgias, back pain, joint swelling and neck pain.  Skin: Negative for rash.       Skin tag on inner left thigh which is getting larger.   Allergic/Immunologic: Positive for environmental allergies.  Neurological: Negative for dizziness, tremors, numbness and headaches.  Hematological: Negative for adenopathy. Does not bruise/bleed easily.  Psychiatric/Behavioral: Negative for behavioral problems (Depression), sleep disturbance and suicidal ideas. The patient is not nervous/anxious.      Today's Vitals   12/29/17 0837  BP: 137/79  Pulse: 76  Resp: 16  SpO2: 95%  Weight: 211 lb 3.2 oz (95.8 kg)  Height: 5\' 3"  (1.6 m)    Physical Exam  Constitutional: She is oriented to person, place, and time. She appears well-developed and well-nourished. No distress.  HENT:  Head: Normocephalic and atraumatic.  Mouth/Throat: Oropharynx is clear and moist. No oropharyngeal exudate.  Eyes: Pupils are equal, round, and reactive to light. EOM are normal.  Neck: Normal range of motion. Neck supple. No JVD present. Carotid bruit is not present. No tracheal deviation present. No thyromegaly present.  Cardiovascular: Normal rate, regular rhythm and normal heart sounds. Exam reveals no gallop and no friction rub.  No murmur heard. Pulmonary/Chest: Effort normal. No respiratory distress. She has no wheezes. She has no rales. She exhibits no tenderness.  Abdominal: Soft. Bowel sounds are normal.  Genitourinary:  Genitourinary Comments: Trace hematuria   Musculoskeletal: Normal range of motion.  Lymphadenopathy:    She has no cervical adenopathy.  Neurological: She is alert and oriented to person, place, and time. No cranial nerve deficit.  Skin: Skin is warm and dry. She is not diaphoretic.  Psychiatric: She has a normal mood and affect. Her behavior is normal. Judgment and thought  content normal.  Nursing note and vitals reviewed.   Assessment/Plan: 1. Encounter for general adult medical examination with abnormal findings Annual wellness visit today.   2. Essential hypertension - propranolol ER (INDERAL LA) 80 MG 24 hr capsule; Take 1 capsule (80 mg total) by mouth daily.  Dispense: 90 capsule; Refill: 4  3. Mixed hyperlipidemia - rosuvastatin (CRESTOR) 10 MG tablet; Take 1 tablet (10 mg total) by mouth daily.  Dispense: 90 tablet; Refill: 4  4. Screening for breast cancer - MM DIGITAL SCREENING BILATERAL; Future  5. Dysuria - POCT Urinalysis Dipstick - trace blood only.   6. Non-seasonal allergic rhinitis due to pollen - levocetirizine (XYZAL) 5 MG tablet; Take 1 tablet (5 mg total) by mouth every evening.  Dispense: 90 tablet; Refill: 4  7. Unspecified menopausal and perimenopausal disorder - conjugated estrogens (PREMARIN) vaginal cream; Use I applicatorful externally twice weekly  Dispense: 30 g; Refill: 11  8. Urinary tract infection with hematuria, site unspecified U/a positive for trace blood only. Will send for culture and sensitivity for further evaluation. Will treat with antibiotics a indicated.  - CULTURE, URINE COMPREHENSIVE  9. 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 I have discussed any further diagnostic  evaluation that may be needed or ordered today. We also reviewed her medications today. she has been encouraged to call the office with any questions or concerns that should arise related to todays visit.   This patient was seen by Leretha Pol, FNP- C in Collaboration with Dr Lavera Guise as a part of collaborative care agreement  Orders Placed This Encounter  Procedures  . CULTURE, URINE COMPREHENSIVE  . MM DIGITAL SCREENING BILATERAL  . Ambulatory referral to  Dermatology  . POCT Urinalysis Dipstick    Meds ordered this encounter  Medications  . conjugated estrogens (PREMARIN) vaginal cream    Sig: Use I applicatorful externally twice weekly    Dispense:  30 g    Refill:  11    Order Specific Question:   Supervising Provider    Answer:   Lavera Guise [[id number]]  . levocetirizine (XYZAL) 5 MG tablet    Sig: Take 1 tablet (5 mg total) by mouth every evening.    Dispense:  90 tablet    Refill:  4    Order Specific Question:   Supervising Provider    Answer:   Lavera Guise [[id number]]  . propranolol ER (INDERAL LA) 80 MG 24 hr capsule    Sig: Take 1 capsule (80 mg total) by mouth daily.    Dispense:  90 capsule    Refill:  4    Order Specific Question:   Supervising Provider    Answer:   Lavera Guise [[id number]]  . rosuvastatin (CRESTOR) 10 MG tablet    Sig: Take 1 tablet (10 mg total) by mouth daily.    Dispense:  90 tablet    Refill:  4    Order Specific Question:   Supervising Provider    Answer:   Lavera Guise [[id number]]    Time spent: Elba, MD  Internal Medicine

## 2018-01-01 LAB — CULTURE, URINE COMPREHENSIVE

## 2018-01-05 ENCOUNTER — Other Ambulatory Visit: Payer: Self-pay | Admitting: Nurse Practitioner

## 2018-01-05 ENCOUNTER — Telehealth: Payer: Self-pay | Admitting: Nurse Practitioner

## 2018-01-05 DIAGNOSIS — R319 Hematuria, unspecified: Principal | ICD-10-CM

## 2018-01-05 DIAGNOSIS — N390 Urinary tract infection, site not specified: Secondary | ICD-10-CM

## 2018-01-05 MED ORDER — SULFAMETHOXAZOLE-TRIMETHOPRIM 800-160 MG PO TABS: 1.0000 | ORAL_TABLET | Freq: Two times a day (BID) | ORAL | 0 refills | Status: DC

## 2018-01-05 NOTE — Progress Notes (Signed)
started bactrim DS bid for 10 days due to resence of uti on urine sample taken at her physical. Sent to [facility].

## 2018-01-05 NOTE — Telephone Encounter (Signed)
-----   Message from Ronnell Freshwater, NP sent at 01/05/2018  8:09 AM EDT ----- Please let the patient know I started bactrim DS bid for 10 days due to resence of uti on urine sample taken at her physical. Sent to [facility].

## 2018-01-05 NOTE — Progress Notes (Signed)
Please let the patient know I started bactrim DS bid for 10 days due to resence of uti on urine sample taken at her physical. Sent to [facility].

## 2018-01-05 NOTE — Telephone Encounter (Signed)
Pt was advised on results and medication at [facility]

## 2018-01-05 NOTE — Progress Notes (Signed)
Patient was informed of results and advised of bactrim at the pharmacy

## 2018-01-08 ENCOUNTER — Ambulatory Visit: Payer: BLUE CROSS/BLUE SHIELD | Admitting: Internal Medicine

## 2018-01-08 ENCOUNTER — Encounter: Payer: Self-pay | Admitting: Internal Medicine

## 2018-01-08 VITALS — BP 124/72 | HR 74 | Resp 16 | Ht 63.0 in | Wt 210.8 lb

## 2018-01-08 DIAGNOSIS — J449 Chronic obstructive pulmonary disease, unspecified: Secondary | ICD-10-CM | POA: Diagnosis not present

## 2018-01-08 DIAGNOSIS — Z9989 Dependence on other enabling machines and devices: Secondary | ICD-10-CM

## 2018-01-08 DIAGNOSIS — G4733 Obstructive sleep apnea (adult) (pediatric): Secondary | ICD-10-CM

## 2018-01-08 NOTE — Progress Notes (Signed)
[facility] [place], [place] [address]  Pulmonary Sleep Medicine   Office Visit Note  Patient Name: Ashley Mckay DOB: 02-08-1951 MRN [id number]  Date of Service: 01/08/2018  Complaints/HPI: She is doing very well.  Has been using her CPAP as prescribed with good results.  She experiences occasional shortness of breath for ambulation but overall has done well.  No chest pain no palpitations.  She has no cough or congestion at this time.  Denies having any fevers no nausea or vomiting.  ROS  General: (-) fever, (-) chills, (-) night sweats, (-) weakness Skin: (-) rashes, (-) itching,. Eyes: (-) visual changes, (-) redness, (-) itching. Nose and Sinuses: (-) nasal stuffiness or itchiness, (-) postnasal drip, (-) nosebleeds, (-) sinus trouble. Mouth and Throat: (-) sore throat, (-) hoarseness. Neck: (-) swollen glands, (-) enlarged thyroid, (-) neck pain. Respiratory: - cough, (-) bloody sputum, - shortness of breath, - wheezing. Cardiovascular: - ankle swelling, (-) chest pain. Lymphatic: (-) lymph node enlargement. Neurologic: (-) numbness, (-) tingling. Psychiatric: (-) anxiety, (-) depression   Current Medication: Outpatient Encounter Medications as of 01/08/2018  Medication Sig Note  . conjugated estrogens (PREMARIN) vaginal cream Use I applicatorful externally twice weekly   . ibuprofen (ADVIL,MOTRIN) 800 MG tablet  06/21/2014: Received from: External Pharmacy  . levocetirizine (XYZAL) 5 MG tablet Take 1 tablet (5 mg total) by mouth every evening.   . meloxicam (MOBIC) 7.5 MG tablet Take 1 tablet (7.5 mg total) by mouth daily.   . Multiple Vitamin (MULTIVITAMIN) tablet Take 1 tablet by mouth daily.   . Omega-3 Fatty Acids (FISH OIL) 500 MG CAPS Take by mouth.   . propranolol ER (INDERAL LA) 80 MG 24 hr capsule Take 1 capsule (80 mg total) by mouth daily.   . rosuvastatin (CRESTOR) 10 MG tablet Take 1 tablet (10 mg total) by mouth daily.   Marland Kitchen  sulfamethoxazole-trimethoprim (BACTRIM DS,SEPTRA DS) 800-160 MG tablet Take 1 tablet by mouth 2 (two) times daily.    No facility-administered encounter medications on file as of 01/08/2018.     Surgical History: Past Surgical History:  Procedure Laterality Date  . COLONOSCOPY    . COLONOSCOPY WITH PROPOFOL N/A 01/31/2016   Procedure: COLONOSCOPY WITH PROPOFOL;  Surgeon: Manya Silvas, MD;  Location: [facility] ENDOSCOPY;  Service: Endoscopy;  Laterality: N/A;  . ESOPHAGOGASTRODUODENOSCOPY      Medical History: Past Medical History:  Diagnosis Date  . Arthritis   . Headache   . History of chickenpox   . Hypertension   . Sleep apnea     Family History: Family History  Problem Relation Age of Onset  . Breast cancer Mother 71    Social History: Social History   Socioeconomic History  . Marital status: Married    Spouse name: Not on file  . Number of children: Not on file  . Years of education: Not on file  . Highest education level: Not on file  Occupational History  . Not on file  Social Needs  . Financial resource strain: Not on file  . Food insecurity:    Worry: Not on file    Inability: Not on file  . Transportation needs:    Medical: Not on file    Non-medical: Not on file  Tobacco Use  . Smoking status: Never Smoker  . Smokeless tobacco: Never Used  Substance and Sexual Activity  . Alcohol use: No  . Drug use: No  . Sexual activity: Not  on file  Lifestyle  . Physical activity:    Days per week: Not on file    Minutes per session: Not on file  . Stress: Not on file  Relationships  . Social connections:    Talks on phone: Not on file    Gets together: Not on file    Attends religious service: Not on file    Active member of club or organization: Not on file    Attends meetings of clubs or organizations: Not on file    Relationship status: Not on file  . Intimate partner violence:    Fear of current or ex partner: Not on file    Emotionally abused: Not  on file    Physically abused: Not on file    Forced sexual activity: Not on file  Other Topics Concern  . Not on file  Social History Narrative  . Not on file    Vital Signs: Blood pressure 124/72, pulse 74, resp. rate 16, height 5\' 3"  (1.6 m), weight 210 lb 12.8 oz (95.6 kg), SpO2 92 %.  Examination: General Appearance: The patient is well-developed, well-nourished, and in no distress. Skin: Gross inspection of skin unremarkable. Head: normocephalic, no gross deformities. Eyes: no gross deformities noted. ENT: ears appear grossly normal no exudates. Neck: Supple. No thyromegaly. No LAD. Respiratory: no rhonchi. Cardiovascular: Normal S1 and S2 without murmur or rub. Extremities: No cyanosis. pulses are equal. Neurologic: Alert and oriented. No involuntary movements.  LABS: Recent Results (from the past 2160 hour(s))  CULTURE, URINE COMPREHENSIVE     Status: Abnormal   Collection Time: 12/29/17  8:40 AM  Result Value Ref Range   Urine Culture, Comprehensive Final report (A)    Organism ID, Bacteria Klebsiella pneumoniae (A)     Comment: 10,000-25,000 colony forming units per mL Cefazolin <=4 ug/mL Cefazolin with an MIC <=16 predicts susceptibility to the oral agents cefaclor, cefdinir, cefpodoxime, cefprozil, cefuroxime, cephalexin, and loracarbef when used for therapy of uncomplicated urinary tract infections due to E. coli, Klebsiella pneumoniae, and Proteus mirabilis.    ANTIMICROBIAL SUSCEPTIBILITY Comment     Comment:       ** S = Susceptible; I = Intermediate; R = Resistant **                    P = Positive; N = Negative             MICS are expressed in micrograms per mL    Antibiotic                 RSLT#1    RSLT#2    RSLT#3    RSLT#4 Amoxicillin/Clavulanic Acid    S Ampicillin                     R Cefepime                       S Ceftriaxone                    S Cefuroxime                     S Ciprofloxacin                  S Ertapenem                       S Gentamicin  S Imipenem                       S Levofloxacin                   S Meropenem                      S Nitrofurantoin                 I Piperacillin/Tazobactam        S Tetracycline                   S Tobramycin                     S Trimethoprim/Sulfa             S   POCT Urinalysis Dipstick     Status: None   Collection Time: 12/29/17  8:54 AM  Result Value Ref Range   Color, UA     Clarity, UA     Glucose, UA negative    Bilirubin, UA small    Ketones, UA negative    Spec Grav, UA 1.015 1.010 - 1.025   Blood, UA trace    pH, UA 6.0 5.0 - 8.0   Protein, UA negative    Urobilinogen, UA 0.2 0.2 or 1.0 E.U./dL   Nitrite, UA negative    Leukocytes, UA Negative Negative   Appearance     Odor      Radiology: Mm Digital Screening Bilateral  Result Date: 03/06/2017 CLINICAL DATA:  Screening. EXAM: DIGITAL SCREENING BILATERAL MAMMOGRAM WITH CAD COMPARISON:  Previous exam(s). ACR Breast Density Category c: The breast tissue is heterogeneously dense, which may obscure small masses. FINDINGS: There are no findings suspicious for malignancy. Images were processed with CAD. IMPRESSION: No mammographic evidence of malignancy. A result letter of this screening mammogram will be mailed directly to the patient. RECOMMENDATION: Screening mammogram in one year. (Code:SM-B-01Y) BI-RADS CATEGORY  1: Negative. Electronically Signed   By: Ammie Ferrier M.D.   On: 03/06/2017 09:39    No results found.  No results found.    Assessment and Plan: Patient Active Problem List   Diagnosis Date Noted  . Screening for breast cancer 12/29/2017  . Essential hypertension 12/29/2017  . Mixed hyperlipidemia 12/29/2017  . Dysuria 12/29/2017  . Non-seasonal allergic rhinitis due to pollen 12/29/2017  . Unspecified menopausal and perimenopausal disorder 12/29/2017  . Urinary tract infection with hematuria 12/29/2017  . Neoplasm of uncertain behavior of skin of lower  extremity 12/29/2017  . Plantar fasciitis 09/11/2015  . Paronychia 08/25/2014    1. OSA on CPAP  Continue with CPAP on the current pressures.  Patient is tolerating well.  No major issues have been noted. 2. COPD  Continue with present management follow up pulmonary functions patient is at baseline 3. Morbid obesity again discussed with her importance of losing weight she is going to work this she states that her husband has also suggested that she on the gym and she is going to look into that  General Counseling: I have discussed the findings of the evaluation and examination with Mackenzy.  I have also discussed any further diagnostic evaluation thatmay be needed or ordered today. Aimy verbalizes understanding of the findings of todays visit. We also reviewed her medications today and discussed drug interactions and side effects including but not limited excessive drowsiness and altered  mental states. We also discussed that there is always a risk not just to her but also people around her. she has been encouraged to call the office with any questions or concerns that should arise related to todays visit.    Time spent: 15  I have personally obtained a history, examined the patient, evaluated laboratory and imaging results, formulated the assessment and plan and placed orders.    Allyne Gee, MD [facility] Pulmonary and Critical Care Sleep medicine

## 2018-01-08 NOTE — Patient Instructions (Signed)

## 2018-02-04 ENCOUNTER — Ambulatory Visit: Payer: BLUE CROSS/BLUE SHIELD | Admitting: Internal Medicine

## 2018-02-04 DIAGNOSIS — R0602 Shortness of breath: Secondary | ICD-10-CM | POA: Diagnosis not present

## 2018-02-04 LAB — PULMONARY FUNCTION TEST

## 2018-02-05 NOTE — Procedures (Signed)
[place], [address]  DATE OF SERVICE: Feb 04, 2018  Complete Pulmonary Function Testing Interpretation:  FINDINGS:  Forced vital capacity is normal.  The FEV1 is normal.  FEV1 FVC ratio is normal.  Postbronchodilator there is no significant improvement in the FEV1 however clinical improvement may occur in the absence of spirometric improvement and does not preclude the use of bronchodilators.  Total lung capacity is normal residual volume is decreased residual volume total lung capacity ratio is decreased FRC is mildly decreased.  The DLCO is within normal limits  IMPRESSION:  This pulmonary function study is within normal limits. No improvement postbronchodilator therapy clinical correlation recommended. DLCO is within normal limits  Allyne Gee, MD [facility] Pulmonary Critical Care Medicine Sleep Medicine

## 2018-02-16 ENCOUNTER — Telehealth: Payer: Self-pay

## 2018-02-16 ENCOUNTER — Encounter: Payer: Self-pay | Admitting: Nurse Practitioner

## 2018-02-16 ENCOUNTER — Encounter: Payer: Self-pay | Admitting: Internal Medicine

## 2018-02-16 NOTE — Telephone Encounter (Signed)
This is bacteria and I put her on antibiotics back in April to get rid of infection.

## 2018-02-16 NOTE — Telephone Encounter (Signed)
Pt called through [facility] for urine  Culture result from 4/49 we already treat her for UTI I explained her  Just bacteria antibiotic will cover that the cream heather gave her hormones

## 2018-02-19 ENCOUNTER — Telehealth: Payer: Self-pay | Admitting: Internal Medicine

## 2018-02-19 NOTE — Telephone Encounter (Signed)
Patient has been advised her pulmonary function test was within normal limits per Dr Humphrey Rolls. Ashley Mckay

## 2018-03-09 ENCOUNTER — Ambulatory Visit
Admission: RE | Admit: 2018-03-09 | Discharge: 2018-03-09 | Disposition: A | Discharge status: Home / Self Care | Payer: BLUE CROSS/BLUE SHIELD | Source: Ambulatory Visit | Attending: Nurse Practitioner | Admitting: Nurse Practitioner

## 2018-03-09 DIAGNOSIS — Z1231 Encounter for screening mammogram for malignant neoplasm of breast: Secondary | ICD-10-CM | POA: Insufficient documentation

## 2018-03-09 DIAGNOSIS — Z1239 Encounter for other screening for malignant neoplasm of breast: Secondary | ICD-10-CM

## 2018-04-13 ENCOUNTER — Other Ambulatory Visit: Payer: Self-pay | Admitting: Nurse Practitioner

## 2018-04-13 ENCOUNTER — Telehealth: Payer: Self-pay

## 2018-04-13 ENCOUNTER — Ambulatory Visit (INDEPENDENT_AMBULATORY_CARE_PROVIDER_SITE_OTHER): Payer: BLUE CROSS/BLUE SHIELD | Admitting: Nurse Practitioner

## 2018-04-13 ENCOUNTER — Encounter: Payer: Self-pay | Admitting: Nurse Practitioner

## 2018-04-13 VITALS — BP 138/78 | HR 77 | Resp 16 | Ht 63.0 in | Wt 217.6 lb

## 2018-04-13 DIAGNOSIS — R21 Rash and other nonspecific skin eruption: Secondary | ICD-10-CM

## 2018-04-13 DIAGNOSIS — B373 Candidiasis of vulva and vagina: Secondary | ICD-10-CM

## 2018-04-13 DIAGNOSIS — B3731 Acute candidiasis of vulva and vagina: Secondary | ICD-10-CM

## 2018-04-13 DIAGNOSIS — Z0001 Encounter for general adult medical examination with abnormal findings: Secondary | ICD-10-CM

## 2018-04-13 DIAGNOSIS — R110 Nausea: Secondary | ICD-10-CM

## 2018-04-13 DIAGNOSIS — K121 Other forms of stomatitis: Secondary | ICD-10-CM | POA: Diagnosis not present

## 2018-04-13 DIAGNOSIS — L2089 Other atopic dermatitis: Secondary | ICD-10-CM

## 2018-04-13 DIAGNOSIS — E559 Vitamin D deficiency, unspecified: Secondary | ICD-10-CM

## 2018-04-13 DIAGNOSIS — K1230 Oral mucositis (ulcerative), unspecified: Secondary | ICD-10-CM

## 2018-04-13 MED ORDER — FLUCONAZOLE 150 MG PO TABS: ORAL_TABLET | ORAL | 0 refills | Status: DC

## 2018-04-13 MED ORDER — ONDANSETRON HCL 4 MG PO TABS: 4.0000 mg | ORAL_TABLET | Freq: Three times a day (TID) | ORAL | 1 refills | Status: DC | PRN

## 2018-04-13 MED ORDER — NYSTATIN 100000 UNIT/GM EX OINT: 1.0000 "application " | TOPICAL_OINTMENT | Freq: Two times a day (BID) | CUTANEOUS | 0 refills | Status: DC

## 2018-04-13 MED ORDER — FIRST-DUKES MOUTHWASH MT SUSP: OROMUCOSAL | 1 refills | Status: DC

## 2018-04-13 NOTE — Progress Notes (Signed)
[place], [place] [address]  Internal MEDICINE  Office Visit Note  Patient Name: Ashley Mckay  [id number]  [id number]  Date of Service: 04/14/2018   Pt is here for a sick visit.  Chief Complaint  Patient presents with  . Rash    dermatitis  . Other    pt seen dermatologist about spots on skin was given a cream and it seems to not be working. seen by dermaologist in june. spots are no entire body, and itches, pt also concerned about mouth being sore as well since this berakout has been going on. she is using magic mouthwash     The patient went to dermatology in early June. Had skin tag removed. Also discussed a rash she had noted. Rash is on forearms and around her back and down both of her legs. Diagnosed with lichen planus. Started on triamcinlone cream which does help the itching some.   Also has rash and itchiness in vaginal area. Initially started after being on antibiotics for UTI. Has not resolved despite treatments with over the counter medications.  Was asked to start premarin cream twice weekly. She is concerned that rash and other symptoms she is having are a result of starting this cream. The last use was this past Saturday.        Current Medication:  Outpatient Encounter Medications as of 04/13/2018  Medication Sig Note  . ibuprofen (ADVIL,MOTRIN) 800 MG tablet  06/21/2014: Received from: External Pharmacy  . levocetirizine (XYZAL) 5 MG tablet Take 1 tablet (5 mg total) by mouth every evening.   . meloxicam (MOBIC) 7.5 MG tablet Take 1 tablet (7.5 mg total) by mouth daily.   . Multiple Vitamin (MULTIVITAMIN) tablet Take 1 tablet by mouth daily.   . Omega-3 Fatty Acids (FISH OIL) 500 MG CAPS Take by mouth.   . propranolol ER (INDERAL LA) 80 MG 24 hr capsule Take 1 capsule (80 mg total) by mouth daily.   . rosuvastatin (CRESTOR) 10 MG tablet Take 1 tablet (10 mg total) by mouth daily.   Marland Kitchen sulfamethoxazole-trimethoprim (BACTRIM  DS,SEPTRA DS) 800-160 MG tablet Take 1 tablet by mouth 2 (two) times daily.   Marland Kitchen triamcinolone cream (KENALOG) 0.1 % Apply 1 application topically 2 (two) times daily.   . [DISCONTINUED] conjugated estrogens (PREMARIN) vaginal cream Use I applicatorful externally twice weekly   . Diphenhyd-Hydrocort-Nystatin (FIRST-DUKES MOUTHWASH) SUSP Swish and swallow 47mls QID prn mouth and throat pain.   . fluconazole (DIFLUCAN) 150 MG tablet Take 1 tablet po once. May repeat dose in 3 days as needed for persistent symptoms.   Marland Kitchen nystatin ointment (MYCOSTATIN) Apply 1 application topically 2 (two) times daily.   . ondansetron (ZOFRAN) 4 MG tablet Take 1 tablet (4 mg total) by mouth every 8 (eight) hours as needed for nausea or vomiting.    No facility-administered encounter medications on file as of 04/13/2018.       Medical History: Past Medical History:  Diagnosis Date  . Arthritis   . Headache   . History of chickenpox   . Hypertension   . Sleep apnea      Today's Vitals   04/13/18 1016  BP: 138/78  Pulse: 77  Resp: 16  SpO2: 94%  Weight: 217 lb 9.6 oz (98.7 kg)  Height: 5\' 3"  (1.6 m)    Review of Systems  Constitutional: Negative for activity change, chills, fatigue and unexpected weight change.  HENT: Positive for mouth sores. Negative for  congestion, postnasal drip, rhinorrhea, sneezing, sore throat and voice change.   Eyes: Negative.  Negative for redness.  Respiratory: Positive for apnea. Negative for cough, chest tightness, shortness of breath and wheezing.   Cardiovascular: Negative for chest pain and palpitations.  Gastrointestinal: Negative for abdominal pain, constipation, diarrhea, nausea and vomiting.  Endocrine: Negative for cold intolerance, heat intolerance, polydipsia, polyphagia and polyuria.  Genitourinary: Negative for dysuria, frequency and urgency.       Vaginal itching and irritation with rash formation along the groin.   Musculoskeletal: Negative for arthralgias,  back pain, joint swelling and neck pain.  Skin: Negative for rash.       Red rash on forearms, back, and legs. itchyand irritated.   Allergic/Immunologic: Positive for environmental allergies.  Neurological: Negative for dizziness, tremors, numbness and headaches.  Hematological: Negative for adenopathy. Does not bruise/bleed easily.  Psychiatric/Behavioral: Negative for behavioral problems (Depression), sleep disturbance and suicidal ideas. The patient is not nervous/anxious.     Physical Exam  Constitutional: She is oriented to person, place, and time. She appears well-developed and well-nourished. No distress.  HENT:  Head: Normocephalic and atraumatic.  Nose: Nose normal.  Mouth/Throat: Oropharynx is clear and moist. Oral lesions present. No oropharyngeal exudate.  Red, ulcerative lesions along the gums and on mucus membranes of cheeks.   Eyes: Pupils are equal, round, and reactive to light. Conjunctivae and EOM are normal.  Neck: Normal range of motion. Neck supple. No JVD present. Carotid bruit is not present. No tracheal deviation present. No thyromegaly present.  Cardiovascular: Normal rate, regular rhythm and normal heart sounds. Exam reveals no gallop and no friction rub.  No murmur heard. Pulmonary/Chest: Effort normal and breath sounds normal. No respiratory distress. She has no wheezes. She has no rales. She exhibits no tenderness.  Abdominal: Soft. Bowel sounds are normal. There is no tenderness.  Genitourinary:  Genitourinary Comments: Trace hematuria   Musculoskeletal: Normal range of motion.  Lymphadenopathy:    She has no cervical adenopathy.  Neurological: She is alert and oriented to person, place, and time. No cranial nerve deficit.  Skin: Skin is warm and dry. Rash noted. She is not diaphoretic.  Red, erythematous, lesions.dispersed along the forearms, the lower back, and on the anterior aspect of both legs. Lesions are dark red and itchy. There is local  inflammation without evidence of infection.   Psychiatric: She has a normal mood and affect. Her behavior is normal. Judgment and thought content normal.  Nursing note and vitals reviewed.  Assessment/Plan: 1. Rash Unknown etiology. Consider adverse drug reaction and RMSF. Discontinue premarin cream. Will send for lab work, testing CBC, RMSF and lymd disease profile. Will treat as indicated. Advised she use previously prescribed triamcinolone cream twice daily as needed.   2. Vaginal candidiasis - fluconazole (DIFLUCAN) 150 MG tablet; Take 1 tablet po once. May repeat dose in 3 days as needed for persistent symptoms.  Dispense: 3 tablet; Refill: 0 - nystatin ointment (MYCOSTATIN); Apply 1 application topically 2 (two) times daily.  Dispense: 30 g; Refill: 0  3. Stomatitis and mucositis - Diphenhyd-Hydrocort-Nystatin (FIRST-DUKES MOUTHWASH) SUSP; Swish and swallow 88mls QID prn mouth and throat pain.  Dispense: 200 mL; Refill: 1  4. Other atopic dermatitis Use prescribed triamcinolone cream as needed and as prescribed to relieve itching and irritation.   5. Nausea - ondansetron (ZOFRAN) 4 MG tablet; Take 1 tablet (4 mg total) by mouth every 8 (eight) hours as needed for nausea or vomiting.  Dispense: 30 tablet;  Refill: 1  General Counseling: Ayanna verbalizes understanding of the findings of todays visit and agrees with plan of treatment. I have discussed any further diagnostic evaluation that may be needed or ordered today. We also reviewed her medications today. she has been encouraged to call the office with any questions or concerns that should arise related to todays visit.    Counseling:  The 'BRAT' diet is suggested, then progress to diet as tolerated as symptoms abate. Call if bloody stools, persistent diarrhea, vomiting, fever or abdominal pain.  This patient was seen by [place] with Dr Lavera Guise as a part of collaborative care agreement  Meds ordered  this encounter  Medications  . fluconazole (DIFLUCAN) 150 MG tablet    Sig: Take 1 tablet po once. May repeat dose in 3 days as needed for persistent symptoms.    Dispense:  3 tablet    Refill:  0    Order Specific Question:   Supervising Provider    Answer:   Lavera Guise [[id number]]  . Diphenhyd-Hydrocort-Nystatin (FIRST-DUKES MOUTHWASH) SUSP    Sig: Swish and swallow 69mls QID prn mouth and throat pain.    Dispense:  200 mL    Refill:  1    Order Specific Question:   Supervising Provider    Answer:   Lavera Guise [[id number]]  . nystatin ointment (MYCOSTATIN)    Sig: Apply 1 application topically 2 (two) times daily.    Dispense:  30 g    Refill:  0    Order Specific Question:   Supervising Provider    Answer:   Lavera Guise [[id number]]  . ondansetron (ZOFRAN) 4 MG tablet    Sig: Take 1 tablet (4 mg total) by mouth every 8 (eight) hours as needed for nausea or vomiting.    Dispense:  30 tablet    Refill:  1    Order Specific Question:   Supervising Provider    Answer:   Lavera Guise [[id number]]    Time spent: 25 Minutes

## 2018-04-13 NOTE — Telephone Encounter (Signed)
PHARMACY REQUESTED ALTERNATIVE FOR [employer] MOUTHWASH (SWISH AND SWALLOW 5 ML 4 TIMES A DAY AS NEEDED FOR MOUTH AND THROAT PAIN) BECAUSE INSURANCE DOES NOT COVER THIS.  FAXED SHEET WITH CHANGE TO : [employer] [place] WITH SAME INSTRUCTIONS PER HEATHER BOSICA.

## 2018-04-14 DIAGNOSIS — R21 Rash and other nonspecific skin eruption: Secondary | ICD-10-CM | POA: Insufficient documentation

## 2018-04-14 DIAGNOSIS — K1230 Oral mucositis (ulcerative), unspecified: Secondary | ICD-10-CM

## 2018-04-14 DIAGNOSIS — R110 Nausea: Secondary | ICD-10-CM | POA: Insufficient documentation

## 2018-04-14 DIAGNOSIS — L2089 Other atopic dermatitis: Secondary | ICD-10-CM | POA: Insufficient documentation

## 2018-04-14 DIAGNOSIS — B373 Candidiasis of vulva and vagina: Secondary | ICD-10-CM | POA: Insufficient documentation

## 2018-04-14 DIAGNOSIS — K121 Other forms of stomatitis: Secondary | ICD-10-CM | POA: Insufficient documentation

## 2018-04-14 DIAGNOSIS — B3731 Acute candidiasis of vulva and vagina: Secondary | ICD-10-CM | POA: Insufficient documentation

## 2018-04-15 ENCOUNTER — Telehealth: Payer: Self-pay

## 2018-04-15 ENCOUNTER — Other Ambulatory Visit: Payer: Self-pay | Admitting: Nurse Practitioner

## 2018-04-15 DIAGNOSIS — R21 Rash and other nonspecific skin eruption: Secondary | ICD-10-CM

## 2018-04-15 LAB — COMPREHENSIVE METABOLIC PANEL
ALBUMIN: 4.5 g/dL (ref 3.6–4.8)
ALT: 13 IU/L (ref 0–32)
AST: 16 IU/L (ref 0–40)
Albumin/Globulin Ratio: 1.6 (ref 1.2–2.2)
Alkaline Phosphatase: 103 IU/L (ref 39–117)
BUN / CREAT RATIO: 18 (ref 12–28)
BUN: 10 mg/dL (ref 8–27)
Bilirubin Total: 0.4 mg/dL (ref 0.0–1.2)
CALCIUM: 9.7 mg/dL (ref 8.7–10.3)
CO2: 27 mmol/L (ref 20–29)
CREATININE: 0.57 mg/dL (ref 0.57–1.00)
Chloride: 102 mmol/L (ref 96–106)
GFR calc Af Amer: 113 mL/min/{1.73_m2} (ref >59)
GFR, EST NON AFRICAN AMERICAN: 98 mL/min/{1.73_m2} (ref >59)
GLOBULIN, TOTAL: 2.8 g/dL (ref 1.5–4.5)
GLUCOSE: 93 mg/dL (ref 65–99)
Potassium: 4.7 mmol/L (ref 3.5–5.2)
Sodium: 142 mmol/L (ref 134–144)
Total Protein: 7.3 g/dL (ref 6.0–8.5)

## 2018-04-15 LAB — CBC
HEMATOCRIT: 43.4 % (ref 34.0–46.6)
Hemoglobin: 14.4 g/dL (ref 11.1–15.9)
MCH: 30.9 pg (ref 26.6–33.0)
MCHC: 33.2 g/dL (ref 31.5–35.7)
MCV: 93 fL (ref 79–97)
Platelets: 279 10*3/uL (ref 150–450)
RBC: 4.66 x10E6/uL (ref 3.77–5.28)
RDW: 13.3 % (ref 12.3–15.4)
WBC: 6.6 10*3/uL (ref 3.4–10.8)

## 2018-04-15 LAB — ROCKY MTN SPOTTED FVR AB, IGM-BLOOD: RMSF IGM: 0.24 {index} (ref 0.00–0.89)

## 2018-04-15 LAB — LIPID PANEL W/O CHOL/HDL RATIO
Cholesterol, Total: 175 mg/dL (ref 100–199)
HDL: 63 mg/dL (ref >39)
LDL Calculated: 78 mg/dL (ref 0–99)
Triglycerides: 169 mg/dL — ABNORMAL HIGH (ref 0–149)
VLDL CHOLESTEROL CAL: 34 mg/dL (ref 5–40)

## 2018-04-15 LAB — TSH: TSH: 2.74 u[IU]/mL (ref 0.450–4.500)

## 2018-04-15 LAB — B. BURGDORFI ANTIBODIES

## 2018-04-15 LAB — VITAMIN D 25 HYDROXY (VIT D DEFICIENCY, FRACTURES): Vit D, 25-Hydroxy: 24.6 ng/mL — ABNORMAL LOW (ref 30.0–100.0)

## 2018-04-15 LAB — T4, FREE: FREE T4: 1.23 ng/dL (ref 0.82–1.77)

## 2018-04-15 MED ORDER — DOXYCYCLINE HYCLATE 100 MG PO TABS: 100.0000 mg | ORAL_TABLET | Freq: Two times a day (BID) | ORAL | 0 refills | Status: DC

## 2018-04-15 NOTE — Telephone Encounter (Signed)
PT WAS NOTIFIED. 

## 2018-04-15 NOTE — Progress Notes (Signed)
Labs look good. Negative for lyme or rocky mountain spotted fever. Will treat with doxycycline 100mg  bid for 10 days to cover bacterial causes of rash. She should continue other meds as prescribed. If no improvement, I recommend she see her dermatologist again.

## 2018-04-15 NOTE — Progress Notes (Signed)
PT WAS NOTIFIED. 

## 2018-04-15 NOTE — Telephone Encounter (Signed)
-----   Message from Ronnell Freshwater, NP sent at 04/15/2018 11:27 AM EDT ----- Please let her know that Labs look good. Negative for lyme or rocky mountain spotted fever. Will treat with doxycycline 100mg  bid for 10 days to cover bacterial causes of rash. She should continue other meds as prescribed. If no improvement, I recommend she see her dermatologist again. Thanks.

## 2018-06-30 ENCOUNTER — Ambulatory Visit (INDEPENDENT_AMBULATORY_CARE_PROVIDER_SITE_OTHER): Payer: PPO | Admitting: Nurse Practitioner

## 2018-06-30 ENCOUNTER — Encounter: Payer: Self-pay | Admitting: Nurse Practitioner

## 2018-06-30 VITALS — BP 142/85 | HR 78 | Resp 16 | Ht 63.0 in | Wt 218.0 lb

## 2018-06-30 DIAGNOSIS — I10 Essential (primary) hypertension: Secondary | ICD-10-CM | POA: Diagnosis not present

## 2018-06-30 DIAGNOSIS — E782 Mixed hyperlipidemia: Secondary | ICD-10-CM | POA: Diagnosis not present

## 2018-06-30 DIAGNOSIS — Z23 Encounter for immunization: Secondary | ICD-10-CM | POA: Diagnosis not present

## 2018-06-30 DIAGNOSIS — L2089 Other atopic dermatitis: Secondary | ICD-10-CM | POA: Diagnosis not present

## 2018-06-30 NOTE — Progress Notes (Signed)
[facility] [place], [place] [address]  Internal MEDICINE  Office Visit Note  Patient Name: Ashley Mckay  [id number]  [id number]  Date of Service: 06/30/2018  Chief Complaint  Patient presents with  . Medical Management of Chronic Issues    6 month follow up  . Hypertension  . Headache    The patient states that she continues to have intermittent rash on the forearms and hands. Did see dermatology and diagnosed with lichen planus. Was getting better, but is starting to flare up again. May go back to see dermatology for further evaluation.   Hypertension  This is a chronic problem. The current episode started more than 1 year ago. The problem is unchanged. The problem is controlled. Pertinent negatives include no chest pain, neck pain, palpitations or shortness of breath. Agents associated with hypertension include NSAIDs. Risk factors for coronary artery disease include obesity, post-menopausal state and stress. Past treatments include beta blockers. The current treatment provides moderate improvement. Compliance problems include exercise.        Current Medication: Outpatient Encounter Medications as of 06/30/2018  Medication Sig Note  . Diphenhyd-Hydrocort-Nystatin (FIRST-DUKES MOUTHWASH) SUSP Swish and swallow 24mls QID prn mouth and throat pain.   Marland Kitchen doxycycline (VIBRA-TABS) 100 MG tablet Take 1 tablet (100 mg total) by mouth 2 (two) times daily.   . fluconazole (DIFLUCAN) 150 MG tablet Take 1 tablet po once. May repeat dose in 3 days as needed for persistent symptoms.   Marland Kitchen ibuprofen (ADVIL,MOTRIN) 800 MG tablet  06/21/2014: Received from: External Pharmacy  . levocetirizine (XYZAL) 5 MG tablet Take 1 tablet (5 mg total) by mouth every evening.   . meloxicam (MOBIC) 7.5 MG tablet Take 1 tablet (7.5 mg total) by mouth daily.   . Multiple Vitamin (MULTIVITAMIN) tablet Take 1 tablet by mouth daily.   Marland Kitchen nystatin ointment (MYCOSTATIN) Apply 1 application  topically 2 (two) times daily.   . Omega-3 Fatty Acids (FISH OIL) 500 MG CAPS Take by mouth.   . ondansetron (ZOFRAN) 4 MG tablet Take 1 tablet (4 mg total) by mouth every 8 (eight) hours as needed for nausea or vomiting.   . propranolol ER (INDERAL LA) 80 MG 24 hr capsule Take 1 capsule (80 mg total) by mouth daily.   . rosuvastatin (CRESTOR) 10 MG tablet Take 1 tablet (10 mg total) by mouth daily.   Marland Kitchen sulfamethoxazole-trimethoprim (BACTRIM DS,SEPTRA DS) 800-160 MG tablet Take 1 tablet by mouth 2 (two) times daily.   Marland Kitchen triamcinolone cream (KENALOG) 0.1 % Apply 1 application topically 2 (two) times daily.    No facility-administered encounter medications on file as of 06/30/2018.     Surgical History: Past Surgical History:  Procedure Laterality Date  . COLONOSCOPY    . COLONOSCOPY WITH PROPOFOL N/A 01/31/2016   Procedure: COLONOSCOPY WITH PROPOFOL;  Surgeon: Manya Silvas, MD;  Location: [facility] ENDOSCOPY;  Service: Endoscopy;  Laterality: N/A;  . ESOPHAGOGASTRODUODENOSCOPY      Medical History: Past Medical History:  Diagnosis Date  . Arthritis   . Headache   . History of chickenpox   . Hypertension   . Sleep apnea     Family History: Family History  Problem Relation Age of Onset  . Breast cancer Mother 96    Social History   Socioeconomic History  . Marital status: Married    Spouse name: Not on file  . Number of children: Not on file  . Years of education: Not on file  .  Highest education level: Not on file  Occupational History  . Not on file  Social Needs  . Financial resource strain: Not on file  . Food insecurity:    Worry: Not on file    Inability: Not on file  . Transportation needs:    Medical: Not on file    Non-medical: Not on file  Tobacco Use  . Smoking status: Never Smoker  . Smokeless tobacco: Never Used  Substance and Sexual Activity  . Alcohol use: No  . Drug use: No  . Sexual activity: Not on file  Lifestyle  . Physical activity:     Days per week: Not on file    Minutes per session: Not on file  . Stress: Not on file  Relationships  . Social connections:    Talks on phone: Not on file    Gets together: Not on file    Attends religious service: Not on file    Active member of club or organization: Not on file    Attends meetings of clubs or organizations: Not on file    Relationship status: Not on file  . Intimate partner violence:    Fear of current or ex partner: Not on file    Emotionally abused: Not on file    Physically abused: Not on file    Forced sexual activity: Not on file  Other Topics Concern  . Not on file  Social History Narrative  . Not on file      Review of Systems  Constitutional: Negative for chills, fatigue and unexpected weight change.  HENT: Negative for congestion, postnasal drip, rhinorrhea, sneezing and sore throat.   Eyes: Negative.  Negative for redness.  Respiratory: Negative for cough, chest tightness, shortness of breath and wheezing.   Cardiovascular: Negative for chest pain and palpitations.  Gastrointestinal: Negative for abdominal pain, constipation, diarrhea, nausea and vomiting.  Endocrine: Negative for cold intolerance, heat intolerance, polydipsia, polyphagia and polyuria.  Genitourinary: Negative for dysuria and frequency.  Musculoskeletal: Positive for arthralgias. Negative for back pain, joint swelling and neck pain.       Knee pain. Does see orthopedics for this.   Skin: Positive for rash.       Intermittent rash on the forearms and hands. Diagnosed with lichen planus.   Allergic/Immunologic: Positive for environmental allergies.  Neurological: Negative.  Negative for tremors and numbness.  Hematological: Negative for adenopathy. Does not bruise/bleed easily.  Psychiatric/Behavioral: Positive for dysphoric mood. Negative for behavioral problems (Depression), sleep disturbance and suicidal ideas. The patient is not nervous/anxious.     Today's Vitals   06/30/18  0839  BP: (!) 142/85  Pulse: 78  Resp: 16  SpO2: 94%  Weight: 218 lb (98.9 kg)  Height: 5\' 3"  (1.6 m)    Physical Exam  Constitutional: She is oriented to person, place, and time. She appears well-developed and well-nourished. No distress.  HENT:  Head: Normocephalic and atraumatic.  Nose: Nose normal.  Mouth/Throat: Oropharynx is clear and moist. Oral lesions present. No oropharyngeal exudate.  Red, ulcerative lesions along the gums and on mucus membranes of cheeks.   Eyes: Pupils are equal, round, and reactive to light. Conjunctivae and EOM are normal.  Neck: Normal range of motion. Neck supple. No JVD present. Carotid bruit is not present. No tracheal deviation present. No thyromegaly present.  Cardiovascular: Normal rate, regular rhythm and normal heart sounds. Exam reveals no gallop and no friction rub.  No murmur heard. Soft systolic murmur auscultated  Pulmonary/Chest:  Effort normal and breath sounds normal. No respiratory distress. She has no wheezes. She has no rales. She exhibits no tenderness.  Abdominal: Soft. Bowel sounds are normal. There is no tenderness.  Genitourinary:  Genitourinary Comments: Trace hematuria   Musculoskeletal: Normal range of motion.  Lymphadenopathy:    She has no cervical adenopathy.  Neurological: She is alert and oriented to person, place, and time. No cranial nerve deficit.  Skin: Skin is warm and dry. Capillary refill takes less than 2 seconds. Rash noted. She is not diaphoretic.  Mild, erythematous rash on bilateral forearms. Much improved since her last visit.  Psychiatric: She has a normal mood and affect. Her behavior is normal. Judgment and thought content normal.  Nursing note and vitals reviewed.  Assessment/Plan: 1. Essential hypertension Stable. Continue bp medication as prescribed  2. Mixed hyperlipidemia Recent fasting lipid panel reviewed. Stable. Continue statin as prescribed   3. Other atopic dermatitis Recommend regular  visits with dermatology as indicated.   4. Need for vaccination against Streptococcus pneumoniae using pneumococcal conjugate vaccine 13 - Pneumococcal conjugate vaccine 13-valent IM  General Counseling: Kayli verbalizes understanding of the findings of todays visit and agrees with plan of treatment. I have discussed any further diagnostic evaluation that may be needed or ordered today. We also reviewed her medications today. she has been encouraged to call the office with any questions or concerns that should arise related to todays visit.    Orders Placed This Encounter  Procedures  . Pneumococcal conjugate vaccine 13-valent IM    Hypertension Counseling:   The following hypertensive lifestyle modification were recommended and discussed:  1. Limiting alcohol intake to less than 1 oz/day of ethanol:(24 oz of beer or 8 oz of wine or 2 oz of 100-proof whiskey). 2. Take baby ASA 81 mg daily. 3. Importance of regular aerobic exercise and losing weight. 4. Reduce dietary saturated fat and cholesterol intake for overall cardiovascular health. 5. Maintaining adequate dietary potassium, calcium, and magnesium intake. 6. Regular monitoring of the blood pressure. 7. Reduce sodium intake to less than 100 mmol/day (less than 2.3 gm of sodium or less than 6 gm of sodium choride)    This patient was seen by [place] with Dr Lavera Guise as a part of collaborative care agreement   Time spent: 9 Minutes      Dr Lavera Guise Internal medicine

## 2018-08-04 DIAGNOSIS — G4733 Obstructive sleep apnea (adult) (pediatric): Secondary | ICD-10-CM | POA: Diagnosis not present

## 2018-08-19 ENCOUNTER — Ambulatory Visit (INDEPENDENT_AMBULATORY_CARE_PROVIDER_SITE_OTHER): Payer: PPO

## 2018-08-19 DIAGNOSIS — G4733 Obstructive sleep apnea (adult) (pediatric): Secondary | ICD-10-CM | POA: Diagnosis not present

## 2018-08-19 NOTE — Progress Notes (Signed)
95 percentile pressure 6   95th percentile leak 4.9   apnea index 2.4 /hr  apnea-hypopnea index  3.8 /hr   total days used  >4 hr 25 days  total days used <4 hr 4 days  Total compliance 83 percent  Ashley Mckay had trouble with supplies but has gotten straight made sure she had good number for kelly at [facility]. She has went medicare and will need appointment for cpap compliance in January.

## 2018-09-08 DIAGNOSIS — G4733 Obstructive sleep apnea (adult) (pediatric): Secondary | ICD-10-CM | POA: Diagnosis not present

## 2018-09-21 ENCOUNTER — Ambulatory Visit: Payer: Medicare HMO | Admitting: Internal Medicine

## 2018-09-21 ENCOUNTER — Encounter: Payer: Self-pay | Admitting: Internal Medicine

## 2018-09-21 VITALS — BP 130/80 | HR 77 | Resp 16 | Ht 63.0 in | Wt 218.0 lb

## 2018-09-21 DIAGNOSIS — J449 Chronic obstructive pulmonary disease, unspecified: Secondary | ICD-10-CM | POA: Diagnosis not present

## 2018-09-21 DIAGNOSIS — Z9989 Dependence on other enabling machines and devices: Secondary | ICD-10-CM

## 2018-09-21 DIAGNOSIS — G4733 Obstructive sleep apnea (adult) (pediatric): Secondary | ICD-10-CM

## 2018-09-21 NOTE — Progress Notes (Signed)
[facility] [address] [place], [place] [address]  Pulmonary Sleep Medicine   Office Visit Note  Patient Name: Ashley Mckay DOB: 12/19/1950 MRN [id number]  Date of Service: 09/21/2018  Complaints/HPI: face to face for CPAP.  Patient is here for a face-to-face meeting for CPAP usage.  She has been doing well with the CPAP her compliance has been greater than 83% her index has been 3.8/h currently her 95th percentile pressure is 6 and she has been doing overall much better with the CPAP device.  She states that she is not as tired as she used to be has more energy during the daytime  ROS  General: (-) fever, (-) chills, (-) night sweats, (-) weakness Skin: (-) rashes, (-) itching,. Eyes: (-) visual changes, (-) redness, (-) itching. Nose and Sinuses: (-) nasal stuffiness or itchiness, (-) postnasal drip, (-) nosebleeds, (-) sinus trouble. Mouth and Throat: (-) sore throat, (-) hoarseness. Neck: (-) swollen glands, (-) enlarged thyroid, (-) neck pain. Respiratory: - cough, (-) bloody sputum, - shortness of breath, - wheezing. Cardiovascular: - ankle swelling, (-) chest pain. Lymphatic: (-) lymph node enlargement. Neurologic: (-) numbness, (-) tingling. Psychiatric: (-) anxiety, (-) depression   Current Medication: Outpatient Encounter Medications as of 09/21/2018  Medication Sig Note  . Diphenhyd-Hydrocort-Nystatin (FIRST-DUKES MOUTHWASH) SUSP Swish and swallow 48mls QID prn mouth and throat pain.   Marland Kitchen doxycycline (VIBRA-TABS) 100 MG tablet Take 1 tablet (100 mg total) by mouth 2 (two) times daily.   . fluconazole (DIFLUCAN) 150 MG tablet Take 1 tablet po once. May repeat dose in 3 days as needed for persistent symptoms.   Marland Kitchen ibuprofen (ADVIL,MOTRIN) 800 MG tablet  06/21/2014: Received from: External Pharmacy  . levocetirizine (XYZAL) 5 MG tablet Take 1 tablet (5 mg total) by mouth every evening.   . meloxicam (MOBIC) 7.5 MG tablet Take 1 tablet (7.5 mg total) by mouth  daily.   . Multiple Vitamin (MULTIVITAMIN) tablet Take 1 tablet by mouth daily.   Marland Kitchen nystatin ointment (MYCOSTATIN) Apply 1 application topically 2 (two) times daily.   . Omega-3 Fatty Acids (FISH OIL) 500 MG CAPS Take by mouth.   . ondansetron (ZOFRAN) 4 MG tablet Take 1 tablet (4 mg total) by mouth every 8 (eight) hours as needed for nausea or vomiting.   . propranolol ER (INDERAL LA) 80 MG 24 hr capsule Take 1 capsule (80 mg total) by mouth daily.   . rosuvastatin (CRESTOR) 10 MG tablet Take 1 tablet (10 mg total) by mouth daily.   Marland Kitchen sulfamethoxazole-trimethoprim (BACTRIM DS,SEPTRA DS) 800-160 MG tablet Take 1 tablet by mouth 2 (two) times daily.   Marland Kitchen triamcinolone cream (KENALOG) 0.1 % Apply 1 application topically 2 (two) times daily.    No facility-administered encounter medications on file as of 09/21/2018.     Surgical History: Past Surgical History:  Procedure Laterality Date  . COLONOSCOPY    . COLONOSCOPY WITH PROPOFOL N/A 01/31/2016   Procedure: COLONOSCOPY WITH PROPOFOL;  Surgeon: Manya Silvas, MD;  Location: [facility] ENDOSCOPY;  Service: Endoscopy;  Laterality: N/A;  . ESOPHAGOGASTRODUODENOSCOPY      Medical History: Past Medical History:  Diagnosis Date  . Arthritis   . Headache   . History of chickenpox   . Hypertension   . Sleep apnea     Family History: Family History  Problem Relation Age of Onset  . Breast cancer Mother 60    Social History: Social History   Socioeconomic History  . Marital  status: Married    Spouse name: Not on file  . Number of children: Not on file  . Years of education: Not on file  . Highest education level: Not on file  Occupational History  . Not on file  Social Needs  . Financial resource strain: Not on file  . Food insecurity:    Worry: Not on file    Inability: Not on file  . Transportation needs:    Medical: Not on file    Non-medical: Not on file  Tobacco Use  . Smoking status: Never Smoker  . Smokeless tobacco:  Never Used  Substance and Sexual Activity  . Alcohol use: No  . Drug use: No  . Sexual activity: Not on file  Lifestyle  . Physical activity:    Days per week: Not on file    Minutes per session: Not on file  . Stress: Not on file  Relationships  . Social connections:    Talks on phone: Not on file    Gets together: Not on file    Attends religious service: Not on file    Active member of club or organization: Not on file    Attends meetings of clubs or organizations: Not on file    Relationship status: Not on file  . Intimate partner violence:    Fear of current or ex partner: Not on file    Emotionally abused: Not on file    Physically abused: Not on file    Forced sexual activity: Not on file  Other Topics Concern  . Not on file  Social History Narrative  . Not on file    Vital Signs: Blood pressure 130/80, pulse 77, resp. rate 16, height 5\' 3"  (1.6 m), weight 218 lb (98.9 kg), SpO2 96 %.  Examination: General Appearance: The patient is well-developed, well-nourished, and in no distress. Skin: Gross inspection of skin unremarkable. Head: normocephalic, no gross deformities. Eyes: no gross deformities noted. ENT: ears appear grossly normal no exudates. Neck: Supple. No thyromegaly. No LAD. Respiratory: no rhonchi noted at this time. Cardiovascular: Normal S1 and S2 without murmur or rub. Extremities: No cyanosis. pulses are equal. Neurologic: Alert and oriented. No involuntary movements.  LABS: No results found for this or any previous visit (from the past 2160 hour(s)).  Radiology: Mm 3d Screen Breast Bilateral  Result Date: 03/09/2018 CLINICAL DATA:  Screening. EXAM: DIGITAL SCREENING BILATERAL MAMMOGRAM WITH TOMO AND CAD COMPARISON:  Previous exam(s). ACR Breast Density Category c: The breast tissue is heterogeneously dense, which may obscure small masses. FINDINGS: There are no findings suspicious for malignancy. Images were processed with CAD. IMPRESSION: No  mammographic evidence of malignancy. A result letter of this screening mammogram will be mailed directly to the patient. RECOMMENDATION: Screening mammogram in one year. (Code:SM-B-01Y) BI-RADS CATEGORY  1: Negative. Electronically Signed   By: Franki Cabot M.D.   On: 03/09/2018 16:23    No results found.  No results found.    Assessment and Plan: Patient Active Problem List   Diagnosis Date Noted  . Need for vaccination against Streptococcus pneumoniae using pneumococcal conjugate vaccine 13 06/30/2018  . Rash 04/14/2018  . Vaginal candidiasis 04/14/2018  . Stomatitis and mucositis 04/14/2018  . Other atopic dermatitis 04/14/2018  . Nausea 04/14/2018  . Screening for breast cancer 12/29/2017  . Essential hypertension 12/29/2017  . Mixed hyperlipidemia 12/29/2017  . Dysuria 12/29/2017  . Non-seasonal allergic rhinitis due to pollen 12/29/2017  . Unspecified menopausal and perimenopausal disorder 12/29/2017  .  Urinary tract infection with hematuria 12/29/2017  . Neoplasm of uncertain behavior of skin of lower extremity 12/29/2017  . Plantar fasciitis 09/11/2015  . Paronychia 08/25/2014    1. OSA on CPAP. Last download shows that her compliance is 83 percent we will continue with current pressure settings she is on an auto titrating machine and currently her optimal CPAP pressure appears to be around 6 2. COPD continue with present management breathing has been under control 3. Morbid obesity she does realize that she has to work on losing weight discussed diet and exercise  General Counseling: I have discussed the findings of the evaluation and examination with Aeron.  I have also discussed any further diagnostic evaluation thatmay be needed or ordered today. Kerissa verbalizes understanding of the findings of todays visit. We also reviewed her medications today and discussed drug interactions and side effects including but not limited excessive drowsiness and altered mental states.  We also discussed that there is always a risk not just to her but also people around her. she has been encouraged to call the office with any questions or concerns that should arise related to todays visit.    Time spent: 15 minutes  I have personally obtained a history, examined the patient, evaluated laboratory and imaging results, formulated the assessment and plan and placed orders.    Allyne Gee, MD [facility] Pulmonary and Critical Care Sleep medicine

## 2018-09-21 NOTE — Patient Instructions (Signed)
CPAP and BPAP Information  CPAP and BPAP are methods of helping a person breathe with the use of air pressure. CPAP stands for "continuous positive airway pressure." BPAP stands for "bi-level positive airway pressure." In both methods, air is blown through your nose or mouth and into your air passages to help you breathe well.  CPAP and BPAP use different amounts of pressure to blow air. With CPAP, the amount of pressure stays the same while you breathe in and out. With BPAP, the amount of pressure is increased when you breathe in (inhale) so that you can take larger breaths. Your health care provider will recommend whether CPAP or BPAP would be more helpful for you.  Why are CPAP and BPAP treatments used?  CPAP or BPAP can be helpful if you have:  · Sleep apnea.  · Chronic obstructive pulmonary disease (COPD).  · Heart failure.  · Medical conditions that weaken the muscles of the chest including muscular dystrophy, or neurological diseases such as amyotrophic lateral sclerosis (ALS).  · Other problems that cause breathing to be weak, abnormal, or difficult.  CPAP is most commonly used for obstructive sleep apnea (OSA) to keep the airways from collapsing when the muscles relax during sleep.  How is CPAP or BPAP administered?  Both CPAP and BPAP are provided by a small machine with a flexible plastic tube that attaches to a plastic mask. You wear the mask. Air is blown through the mask into your nose or mouth. The amount of pressure that is used to blow the air can be adjusted on the machine. Your health care provider will determine the pressure setting that should be used based on your individual needs.  When should CPAP or BPAP be used?  In most cases, the mask only needs to be worn during sleep. Generally, the mask needs to be worn throughout the night and during any daytime naps. People with certain medical conditions may also need to wear the mask at other times when they are awake. Follow instructions from your  health care provider about when to use the machine.  What are some tips for using the mask?    · Because the mask needs to be snug, some people feel trapped or closed-in (claustrophobic) when first using the mask. If you feel this way, you may need to get used to the mask. One way to do this is by holding the mask loosely over your nose or mouth and then gradually applying the mask more snugly. You can also gradually increase the amount of time that you use the mask.  · Masks are available in various types and sizes. Some fit over your mouth and nose while others fit over just your nose. If your mask does not fit well, talk with your health care provider about getting a different one.  · If you are using a mask that fits over your nose and you tend to breathe through your mouth, a chin strap may be applied to help keep your mouth closed.  · The CPAP and BPAP machines have alarms that may sound if the mask comes off or develops a leak.  · If you have trouble with the mask, it is very important that you talk with your health care provider about finding a way to make the mask easier to tolerate. Do not stop using the mask. Stopping the use of the mask could have a negative impact on your health.  What are some tips for using the machine?  ·   Place your CPAP or BPAP machine on a secure table or stand near an electrical outlet.  · Know where the on/off switch is located on the machine.  · Follow instructions from your health care provider about how to set the pressure on your machine and when you should use it.  · Do not eat or drink while the CPAP or BPAP machine is on. Food or fluids could get pushed into your lungs by the pressure of the CPAP or BPAP.  · Do not smoke. Tobacco smoke residue can damage the machine.  · For home use, CPAP and BPAP machines can be rented or purchased through home health care companies. Many different brands of machines are available. Renting a machine before purchasing may help you find out  which particular machine works well for you.  · Keep the CPAP or BPAP machine and attachments clean. Ask your health care provider for specific instructions.  Get help right away if:  · You have redness or open areas around your nose or mouth where the mask fits.  · You have trouble using the CPAP or BPAP machine.  · You cannot tolerate wearing the CPAP or BPAP mask.  · You have pain, discomfort, and bloating in your abdomen.  Summary  · CPAP and BPAP are methods of helping a person breathe with the use of air pressure.  · Both CPAP and BPAP are provided by a small machine with a flexible plastic tube that attaches to a plastic mask.  · If you have trouble with the mask, it is very important that you talk with your health care provider about finding a way to make the mask easier to tolerate.  This information is not intended to replace advice given to you by your health care provider. Make sure you discuss any questions you have with your health care provider.  Document Released: 05/24/2004 Document Revised: 04/28/2018 Document Reviewed: 07/15/2016  Elsevier Interactive Patient Education © 2019 [employer]

## 2018-10-22 ENCOUNTER — Encounter (INDEPENDENT_AMBULATORY_CARE_PROVIDER_SITE_OTHER): Payer: Self-pay

## 2018-11-06 DIAGNOSIS — G4733 Obstructive sleep apnea (adult) (pediatric): Secondary | ICD-10-CM | POA: Diagnosis not present

## 2019-01-04 ENCOUNTER — Ambulatory Visit: Payer: Self-pay | Admitting: Nurse Practitioner

## 2019-01-07 DIAGNOSIS — L409 Psoriasis, unspecified: Secondary | ICD-10-CM | POA: Diagnosis not present

## 2019-01-07 DIAGNOSIS — M1990 Unspecified osteoarthritis, unspecified site: Secondary | ICD-10-CM | POA: Diagnosis not present

## 2019-01-07 DIAGNOSIS — G4733 Obstructive sleep apnea (adult) (pediatric): Secondary | ICD-10-CM | POA: Diagnosis not present

## 2019-01-07 DIAGNOSIS — J309 Allergic rhinitis, unspecified: Secondary | ICD-10-CM | POA: Diagnosis not present

## 2019-01-07 DIAGNOSIS — G8929 Other chronic pain: Secondary | ICD-10-CM | POA: Diagnosis not present

## 2019-01-07 DIAGNOSIS — R32 Unspecified urinary incontinence: Secondary | ICD-10-CM | POA: Diagnosis not present

## 2019-01-07 DIAGNOSIS — G43909 Migraine, unspecified, not intractable, without status migrainosus: Secondary | ICD-10-CM | POA: Diagnosis not present

## 2019-01-07 DIAGNOSIS — K5900 Constipation, unspecified: Secondary | ICD-10-CM | POA: Diagnosis not present

## 2019-01-07 DIAGNOSIS — E785 Hyperlipidemia, unspecified: Secondary | ICD-10-CM | POA: Diagnosis not present

## 2019-01-08 ENCOUNTER — Other Ambulatory Visit: Payer: Self-pay

## 2019-01-08 DIAGNOSIS — I10 Essential (primary) hypertension: Secondary | ICD-10-CM

## 2019-01-08 MED ORDER — PROPRANOLOL HCL ER 80 MG PO CP24: 80.0000 mg | ORAL_CAPSULE | Freq: Every day | ORAL | 0 refills | Status: DC

## 2019-01-11 ENCOUNTER — Ambulatory Visit: Payer: Self-pay | Admitting: Internal Medicine

## 2019-02-12 ENCOUNTER — Ambulatory Visit: Payer: Self-pay | Admitting: Nurse Practitioner

## 2019-02-17 ENCOUNTER — Ambulatory Visit: Payer: Self-pay

## 2019-04-05 ENCOUNTER — Ambulatory Visit: Payer: Self-pay | Admitting: Internal Medicine

## 2019-04-05 ENCOUNTER — Other Ambulatory Visit: Payer: Self-pay

## 2019-04-05 DIAGNOSIS — I10 Essential (primary) hypertension: Secondary | ICD-10-CM

## 2019-04-05 MED ORDER — PROPRANOLOL HCL ER 80 MG PO CP24: 80.0000 mg | ORAL_CAPSULE | Freq: Every day | ORAL | 0 refills | Status: DC

## 2019-04-05 NOTE — Telephone Encounter (Signed)
Pt advised to keep her follow up appt otherwise not able to do future refills, she understood. Sent 30 day refill in.lm

## 2019-04-13 ENCOUNTER — Ambulatory Visit: Payer: Self-pay | Admitting: Nurse Practitioner

## 2019-04-20 DIAGNOSIS — I781 Nevus, non-neoplastic: Secondary | ICD-10-CM | POA: Diagnosis not present

## 2019-04-20 DIAGNOSIS — Z1283 Encounter for screening for malignant neoplasm of skin: Secondary | ICD-10-CM | POA: Diagnosis not present

## 2019-04-20 DIAGNOSIS — L821 Other seborrheic keratosis: Secondary | ICD-10-CM | POA: Diagnosis not present

## 2019-04-20 DIAGNOSIS — D2261 Melanocytic nevi of right upper limb, including shoulder: Secondary | ICD-10-CM | POA: Diagnosis not present

## 2019-04-20 DIAGNOSIS — D2262 Melanocytic nevi of left upper limb, including shoulder: Secondary | ICD-10-CM | POA: Diagnosis not present

## 2019-04-20 DIAGNOSIS — D225 Melanocytic nevi of trunk: Secondary | ICD-10-CM | POA: Diagnosis not present

## 2019-04-20 DIAGNOSIS — L814 Other melanin hyperpigmentation: Secondary | ICD-10-CM | POA: Diagnosis not present

## 2019-04-20 DIAGNOSIS — D1800 Hemangioma unspecified site: Secondary | ICD-10-CM | POA: Diagnosis not present

## 2019-04-20 DIAGNOSIS — L719 Rosacea, unspecified: Secondary | ICD-10-CM | POA: Diagnosis not present

## 2019-04-20 DIAGNOSIS — D2361 Other benign neoplasm of skin of right upper limb, including shoulder: Secondary | ICD-10-CM | POA: Diagnosis not present

## 2019-04-21 ENCOUNTER — Other Ambulatory Visit: Payer: Self-pay | Admitting: Nurse Practitioner

## 2019-04-21 DIAGNOSIS — J301 Allergic rhinitis due to pollen: Secondary | ICD-10-CM

## 2019-04-21 DIAGNOSIS — G4733 Obstructive sleep apnea (adult) (pediatric): Secondary | ICD-10-CM | POA: Diagnosis not present

## 2019-04-21 MED ORDER — LEVOCETIRIZINE DIHYDROCHLORIDE 5 MG PO TABS: 5.0000 mg | ORAL_TABLET | Freq: Every evening | ORAL | 3 refills | Status: DC

## 2019-04-22 ENCOUNTER — Ambulatory Visit (INDEPENDENT_AMBULATORY_CARE_PROVIDER_SITE_OTHER): Payer: Medicare HMO | Admitting: Nurse Practitioner

## 2019-04-22 ENCOUNTER — Other Ambulatory Visit: Payer: Self-pay

## 2019-04-22 ENCOUNTER — Encounter: Payer: Self-pay | Admitting: Nurse Practitioner

## 2019-04-22 VITALS — BP 124/65 | HR 74 | Resp 16 | Ht 63.0 in | Wt 217.0 lb

## 2019-04-22 DIAGNOSIS — Z0001 Encounter for general adult medical examination with abnormal findings: Secondary | ICD-10-CM

## 2019-04-22 DIAGNOSIS — R300 Dysuria: Secondary | ICD-10-CM

## 2019-04-22 DIAGNOSIS — Z1239 Encounter for other screening for malignant neoplasm of breast: Secondary | ICD-10-CM | POA: Diagnosis not present

## 2019-04-22 DIAGNOSIS — E782 Mixed hyperlipidemia: Secondary | ICD-10-CM

## 2019-04-22 DIAGNOSIS — I10 Essential (primary) hypertension: Secondary | ICD-10-CM

## 2019-04-22 DIAGNOSIS — R32 Unspecified urinary incontinence: Secondary | ICD-10-CM

## 2019-04-22 MED ORDER — ROSUVASTATIN CALCIUM 10 MG PO TABS: 10.0000 mg | ORAL_TABLET | Freq: Every day | ORAL | 3 refills | Status: DC

## 2019-04-22 MED ORDER — OXYBUTYNIN CHLORIDE ER 10 MG PO TB24: 10.0000 mg | ORAL_TABLET | Freq: Every day | ORAL | 1 refills | Status: DC

## 2019-04-22 MED ORDER — PROPRANOLOL HCL ER 80 MG PO CP24: 80.0000 mg | ORAL_CAPSULE | Freq: Every day | ORAL | 3 refills | Status: DC

## 2019-04-22 NOTE — Progress Notes (Signed)
[facility] [place], [place] [address]  Internal MEDICINE  Office Visit Note  Patient Name: Ashley Mckay  [id number]  [id number]  Date of Service: 04/25/2019   Pt is here for routine health maintenance examination   Chief Complaint  Patient presents with  . Annual Exam  . Arthritis  . Hypertension  . Urine Output    problems with leaking urine     The patient is here for health maintenance exam. She is having episodes of urinary frequency and incontinence. Has to run to the bathroom when she has to urinate, otherwise, she will have accident. Has to wear pantiliner or pad at all times. She is due to have routine, fasting labs and screening mammogram. She also needs to have refills for all routine medications.    Current Medication: Outpatient Encounter Medications as of 04/22/2019  Medication Sig Note  . Docusate Calcium (STOOL SOFTENER PO) Take by mouth.   Marland Kitchen ibuprofen (ADVIL,MOTRIN) 800 MG tablet 800 mg every 8 (eight) hours as needed.  06/21/2014: Received from: External Pharmacy  . levocetirizine (XYZAL) 5 MG tablet Take 1 tablet (5 mg total) by mouth every evening.   . meloxicam (MOBIC) 7.5 MG tablet Take 1 tablet (7.5 mg total) by mouth daily. (Patient taking differently: Take 7.5 mg by mouth as needed. )   . ondansetron (ZOFRAN) 4 MG tablet Take 1 tablet (4 mg total) by mouth every 8 (eight) hours as needed for nausea or vomiting.   . propranolol ER (INDERAL LA) 80 MG 24 hr capsule Take 1 capsule (80 mg total) by mouth daily.   . rosuvastatin (CRESTOR) 10 MG tablet Take 1 tablet (10 mg total) by mouth daily.   Marland Kitchen triamcinolone cream (KENALOG) 0.1 % Apply 1 application topically 2 (two) times daily.   Marland Kitchen VITAMIN D, CHOLECALCIFEROL, PO Take by mouth daily.   . [DISCONTINUED] propranolol ER (INDERAL LA) 80 MG 24 hr capsule Take 1 capsule (80 mg total) by mouth daily.   . [DISCONTINUED] rosuvastatin (CRESTOR) 10 MG tablet Take 1 tablet (10 mg total) by  mouth daily.   Marland Kitchen oxybutynin (DITROPAN-XL) 10 MG 24 hr tablet Take 1 tablet (10 mg total) by mouth at bedtime.   . [DISCONTINUED] Diphenhyd-Hydrocort-Nystatin (FIRST-DUKES MOUTHWASH) SUSP Swish and swallow 70mls QID prn mouth and throat pain. (Patient not taking: Reported on 04/22/2019)   . [DISCONTINUED] doxycycline (VIBRA-TABS) 100 MG tablet Take 1 tablet (100 mg total) by mouth 2 (two) times daily. (Patient not taking: Reported on 04/22/2019)   . [DISCONTINUED] fluconazole (DIFLUCAN) 150 MG tablet Take 1 tablet po once. May repeat dose in 3 days as needed for persistent symptoms. (Patient not taking: Reported on 04/22/2019)   . [DISCONTINUED] Multiple Vitamin (MULTIVITAMIN) tablet Take 1 tablet by mouth daily.   . [DISCONTINUED] nystatin ointment (MYCOSTATIN) Apply 1 application topically 2 (two) times daily. (Patient not taking: Reported on 04/22/2019)   . [DISCONTINUED] Omega-3 Fatty Acids (FISH OIL) 500 MG CAPS Take by mouth.   . [DISCONTINUED] sulfamethoxazole-trimethoprim (BACTRIM DS,SEPTRA DS) 800-160 MG tablet Take 1 tablet by mouth 2 (two) times daily. (Patient not taking: Reported on 04/22/2019)    No facility-administered encounter medications on file as of 04/22/2019.     Surgical History: Past Surgical History:  Procedure Laterality Date  . COLONOSCOPY    . COLONOSCOPY WITH PROPOFOL N/A 01/31/2016   Procedure: COLONOSCOPY WITH PROPOFOL;  Surgeon: Manya Silvas, MD;  Location: [facility] ENDOSCOPY;  Service: Endoscopy;  Laterality: N/A;  .  ESOPHAGOGASTRODUODENOSCOPY      Medical History: Past Medical History:  Diagnosis Date  . Arthritis   . Headache   . History of chickenpox   . Hypertension   . Sleep apnea     Family History: Family History  Problem Relation Age of Onset  . Breast cancer Mother 52      Review of Systems  Constitutional: Negative for chills, fatigue and unexpected weight change.  HENT: Negative for congestion, postnasal drip, rhinorrhea, sneezing and sore  throat.   Eyes: Negative.   Respiratory: Negative for cough, chest tightness, shortness of breath and wheezing.   Cardiovascular: Negative for chest pain and palpitations.  Gastrointestinal: Negative.  Negative for abdominal pain, constipation, diarrhea, nausea and vomiting.  Endocrine: Negative for cold intolerance, heat intolerance, polydipsia and polyuria.  Genitourinary: Negative for dysuria and frequency.       Intermittent yeast infections.   Musculoskeletal: Negative for arthralgias, back pain, joint swelling and neck pain.  Skin: Negative for rash.  Allergic/Immunologic: Positive for environmental allergies.  Neurological: Negative for dizziness, tremors, numbness and headaches.  Hematological: Negative for adenopathy. Does not bruise/bleed easily.  Psychiatric/Behavioral: Positive for dysphoric mood. Negative for behavioral problems (Depression), sleep disturbance and suicidal ideas. The patient is not nervous/anxious.    Today's Vitals   04/22/19 1535  BP: 124/65  Pulse: 74  Resp: 16  SpO2: 95%  Weight: 217 lb (98.4 kg)  Height: 5\' 3"  (1.6 m)   Body mass index is 38.44 kg/m.  Physical Exam Vitals signs and nursing note reviewed.  Constitutional:      General: She is not in acute distress.    Appearance: Normal appearance. She is well-developed. She is obese. She is not diaphoretic.  HENT:     Head: Normocephalic and atraumatic.     Nose: Nose normal.     Mouth/Throat:     Mouth: Oral lesions present.     Pharynx: No oropharyngeal exudate.  Eyes:     Conjunctiva/sclera: Conjunctivae normal.     Pupils: Pupils are equal, round, and reactive to light.  Neck:     Musculoskeletal: Normal range of motion and neck supple.     Thyroid: No thyromegaly.     Vascular: No carotid bruit or JVD.     Trachea: No tracheal deviation.  Cardiovascular:     Rate and Rhythm: Normal rate and regular rhythm.     Pulses: Normal pulses.     Heart sounds: Normal heart sounds. No  murmur. No friction rub. No gallop.   Pulmonary:     Effort: Pulmonary effort is normal. No respiratory distress.     Breath sounds: Normal breath sounds. No wheezing or rales.  Chest:     Chest wall: No tenderness.     Breasts:        Right: Normal. No swelling, bleeding, inverted nipple, mass, nipple discharge, skin change or tenderness.        Left: Normal. No swelling, bleeding, inverted nipple, mass, nipple discharge, skin change or tenderness.  Abdominal:     General: Bowel sounds are normal.     Palpations: Abdomen is soft.     Tenderness: There is no abdominal tenderness.  Genitourinary:    Comments: Trace hematuria  Musculoskeletal: Normal range of motion.  Lymphadenopathy:     Cervical: No cervical adenopathy.  Skin:    General: Skin is warm and dry.     Comments: Red, erythematous, lesions.dispersed along the forearms, the lower back, and on the anterior  aspect of both legs. Lesions are dark red and itchy. There is local inflammation without evidence of infection.   Neurological:     Mental Status: She is alert and oriented to person, place, and time.     Cranial Nerves: No cranial nerve deficit.  Psychiatric:        Behavior: Behavior normal.        Thought Content: Thought content normal.        Judgment: Judgment normal.    Depression screen [facility] 2/9 04/22/2019 09/21/2018 04/13/2018 12/29/2017  Decreased Interest 0 0 0 0  Down, Depressed, Hopeless 0 0 0 0  PHQ - 2 Score 0 0 0 0    Functional Status Survey: Is the patient deaf or have difficulty hearing?: No Does the patient have difficulty seeing, even when wearing glasses/contacts?: No Does the patient have difficulty concentrating, remembering, or making decisions?: No Does the patient have difficulty walking or climbing stairs?: No Does the patient have difficulty dressing or bathing?: No Does the patient have difficulty doing errands alone such as visiting a doctor's office or shopping?: No  MMSE - Mini Mental  State Exam 04/22/2019  Orientation to time 5  Orientation to Place 5  Registration 3  Attention/ Calculation 5  Recall 3  Language- name 2 objects 2  Language- repeat 1  Language- follow 3 step command 3  Language- read & follow direction 1  Write a sentence 1  Copy design 1  Total score 30    Fall Risk  04/22/2019 09/21/2018 04/13/2018 12/29/2017  Falls in the past year? 0 0 No No  Number falls in past yr: - 0 - -  Injury with Fall? - 0 - -      LABS: Recent Results (from the past 2160 hour(s))  Urinalysis, Routine w reflex microscopic     Status: Abnormal   Collection Time: 04/22/19  3:33 AM  Result Value Ref Range   Specific Gravity, UA 1.017 1.005 - 1.030   pH, UA 7.0 5.0 - 7.5   Color, UA Yellow Yellow   Appearance Ur Clear Clear   Leukocytes,UA Trace (A) Negative   Protein,UA Negative Negative/Trace   Glucose, UA Negative Negative   Ketones, UA Negative Negative   RBC, UA Negative Negative   Bilirubin, UA Negative Negative   Urobilinogen, Ur 1.0 0.2 - 1.0 mg/dL   Nitrite, UA Negative Negative   Microscopic Examination See below:     Comment: Microscopic was indicated and was performed.  Microscopic Examination     Status: None   Collection Time: 04/22/19  3:33 AM   URINE  Result Value Ref Range   WBC, UA 0-5 0 - 5 /hpf   RBC 0-2 0 - 2 /hpf   Epithelial Cells (non renal) 0-10 0 - 10 /hpf   Casts None seen None seen /lpf   Mucus, UA Present Not Estab.   Bacteria, UA None seen None seen/Few   Assessment/Plan: 1. Encounter for general adult medical examination with abnormal findings Annual health maintenance exam today.   2. Essential hypertension Stable. Continue propranolol LA as prescribed  - propranolol ER (INDERAL LA) 80 MG 24 hr capsule; Take 1 capsule (80 mg total) by mouth daily.  Dispense: 90 capsule; Refill: 3  3. Mixed hyperlipidemia Continue crestor as prescribed  - rosuvastatin (CRESTOR) 10 MG tablet; Take 1 tablet (10 mg total) by mouth daily.   Dispense: 90 tablet; Refill: 3  4. Urinary incontinence, unspecified type Will start oxybutynin XL 10mg   every evening. Reassess at next visit.  - oxybutynin (DITROPAN-XL) 10 MG 24 hr tablet; Take 1 tablet (10 mg total) by mouth at bedtime.  Dispense: 90 tablet; Refill: 1  5. Screening for breast cancer - MM DIGITAL SCREENING BILATERAL; Future  6. Dysuria - Urinalysis, Routine w reflex microscopic  General Counseling: Stevee verbalizes understanding of the findings of todays visit and agrees with plan of treatment. I have discussed any further diagnostic evaluation that may be needed or ordered today. We also reviewed her medications today. she has been encouraged to call the office with any questions or concerns that should arise related to todays visit.    Counseling:  Hypertension Counseling:   The following hypertensive lifestyle modification were recommended and discussed:  1. Limiting alcohol intake to less than 1 oz/day of ethanol:(24 oz of beer or 8 oz of wine or 2 oz of 100-proof whiskey). 2. Take baby ASA 81 mg daily. 3. Importance of regular aerobic exercise and losing weight. 4. Reduce dietary saturated fat and cholesterol intake for overall cardiovascular health. 5. Maintaining adequate dietary potassium, calcium, and magnesium intake. 6. Regular monitoring of the blood pressure. 7. Reduce sodium intake to less than 100 mmol/day (less than 2.3 gm of sodium or less than 6 gm of sodium choride)   This patient was seen by [place] with Dr Lavera Guise as a part of collaborative care agreement  Orders Placed This Encounter  Procedures  . Microscopic Examination  . MM DIGITAL SCREENING BILATERAL  . Urinalysis, Routine w reflex microscopic    Meds ordered this encounter  Medications  . propranolol ER (INDERAL LA) 80 MG 24 hr capsule    Sig: Take 1 capsule (80 mg total) by mouth daily.    Dispense:  90 capsule    Refill:  3    Order Specific  Question:   Supervising Provider    Answer:   Lavera Guise [[id number]]  . rosuvastatin (CRESTOR) 10 MG tablet    Sig: Take 1 tablet (10 mg total) by mouth daily.    Dispense:  90 tablet    Refill:  3    Order Specific Question:   Supervising Provider    Answer:   Lavera Guise [[id number]]  . oxybutynin (DITROPAN-XL) 10 MG 24 hr tablet    Sig: Take 1 tablet (10 mg total) by mouth at bedtime.    Dispense:  90 tablet    Refill:  1    Order Specific Question:   Supervising Provider    Answer:   Lavera Guise [[id number]]    Time spent: [place], MD  Internal Medicine

## 2019-04-23 LAB — URINALYSIS, ROUTINE W REFLEX MICROSCOPIC
Bilirubin, UA: NEGATIVE
Glucose, UA: NEGATIVE
Ketones, UA: NEGATIVE
Nitrite, UA: NEGATIVE
Protein,UA: NEGATIVE
RBC, UA: NEGATIVE
Specific Gravity, UA: 1.017 (ref 1.005–1.030)
Urobilinogen, Ur: 1 mg/dL (ref 0.2–1.0)
pH, UA: 7 (ref 5.0–7.5)

## 2019-04-23 LAB — MICROSCOPIC EXAMINATION
Bacteria, UA: NONE SEEN
Casts: NONE SEEN /lpf

## 2019-04-25 DIAGNOSIS — R32 Unspecified urinary incontinence: Secondary | ICD-10-CM | POA: Insufficient documentation

## 2019-05-03 ENCOUNTER — Ambulatory Visit
Admission: RE | Admit: 2019-05-03 | Discharge: 2019-05-03 | Disposition: A | Discharge status: Home / Self Care | Payer: Medicare HMO | Source: Ambulatory Visit | Attending: Nurse Practitioner | Admitting: Nurse Practitioner

## 2019-05-03 DIAGNOSIS — Z1231 Encounter for screening mammogram for malignant neoplasm of breast: Secondary | ICD-10-CM | POA: Insufficient documentation

## 2019-05-03 DIAGNOSIS — Z1239 Encounter for other screening for malignant neoplasm of breast: Secondary | ICD-10-CM | POA: Diagnosis present

## 2019-05-05 NOTE — Progress Notes (Signed)
Negative mammogram

## 2019-05-25 DIAGNOSIS — R69 Illness, unspecified: Secondary | ICD-10-CM | POA: Diagnosis not present

## 2019-05-27 DIAGNOSIS — R69 Illness, unspecified: Secondary | ICD-10-CM | POA: Diagnosis not present

## 2019-06-07 ENCOUNTER — Encounter: Payer: Self-pay | Admitting: Internal Medicine

## 2019-06-07 ENCOUNTER — Ambulatory Visit: Payer: Medicare HMO | Admitting: Internal Medicine

## 2019-06-07 ENCOUNTER — Other Ambulatory Visit: Payer: Self-pay

## 2019-06-07 VITALS — BP 140/78 | HR 67 | Resp 16 | Ht 63.0 in | Wt 213.0 lb

## 2019-06-07 DIAGNOSIS — Z9989 Dependence on other enabling machines and devices: Secondary | ICD-10-CM

## 2019-06-07 DIAGNOSIS — J449 Chronic obstructive pulmonary disease, unspecified: Secondary | ICD-10-CM

## 2019-06-07 DIAGNOSIS — I10 Essential (primary) hypertension: Secondary | ICD-10-CM

## 2019-06-07 DIAGNOSIS — G4733 Obstructive sleep apnea (adult) (pediatric): Secondary | ICD-10-CM | POA: Diagnosis not present

## 2019-06-07 NOTE — Progress Notes (Signed)
[facility] [place], [place] [address]  Pulmonary Sleep Medicine   Office Visit Note  Patient Name: Ashley Mckay DOB: [birth year] MRN [id number]  Date of Service: 06/07/2019  Complaints/HPI: Pt is here for possible sleep study. She reports she is wearing her cpap without difficulty.  She is in need of supplies, and needs a new RX. She was told she may need a new sleep study in order to get more supplies.  She denies any mask leak on her machine, and feels like she continues to have relief of symptoms.  She denies snoring through her machine.   ROS  General: (-) fever, (-) chills, (-) night sweats, (-) weakness Skin: (-) rashes, (-) itching,. Eyes: (-) visual changes, (-) redness, (-) itching. Nose and Sinuses: (-) nasal stuffiness or itchiness, (-) postnasal drip, (-) nosebleeds, (-) sinus trouble. Mouth and Throat: (-) sore throat, (-) hoarseness. Neck: (-) swollen glands, (-) enlarged thyroid, (-) neck pain. Respiratory: - cough, (-) bloody sputum, - shortness of breath, - wheezing. Cardiovascular: - ankle swelling, (-) chest pain. Lymphatic: (-) lymph node enlargement. Neurologic: (-) numbness, (-) tingling. Psychiatric: (-) anxiety, (-) depression   Current Medication: Outpatient Encounter Medications as of 06/07/2019  Medication Sig Note  . Docusate Calcium (STOOL SOFTENER PO) Take by mouth.   Marland Kitchen ibuprofen (ADVIL,MOTRIN) 800 MG tablet 800 mg every 8 (eight) hours as needed.  06/21/2014: Received from: External Pharmacy  . levocetirizine (XYZAL) 5 MG tablet Take 1 tablet (5 mg total) by mouth every evening.   . meloxicam (MOBIC) 7.5 MG tablet Take 1 tablet (7.5 mg total) by mouth daily. (Patient taking differently: Take 7.5 mg by mouth as needed. )   . ondansetron (ZOFRAN) 4 MG tablet Take 1 tablet (4 mg total) by mouth every 8 (eight) hours as needed for nausea or vomiting.   Marland Kitchen oxybutynin (DITROPAN-XL) 10 MG 24 hr tablet Take 1 tablet (10 mg total) by  mouth at bedtime.   . propranolol ER (INDERAL LA) 80 MG 24 hr capsule Take 1 capsule (80 mg total) by mouth daily.   . rosuvastatin (CRESTOR) 10 MG tablet Take 1 tablet (10 mg total) by mouth daily.   Marland Kitchen triamcinolone cream (KENALOG) 0.1 % Apply 1 application topically 2 (two) times daily.   Marland Kitchen VITAMIN D, CHOLECALCIFEROL, PO Take by mouth daily.    No facility-administered encounter medications on file as of 06/07/2019.     Surgical History: Past Surgical History:  Procedure Laterality Date  . COLONOSCOPY    . COLONOSCOPY WITH PROPOFOL N/A 01/31/2016   Procedure: COLONOSCOPY WITH PROPOFOL;  Surgeon: Manya Silvas, MD;  Location: [facility] ENDOSCOPY;  Service: Endoscopy;  Laterality: N/A;  . ESOPHAGOGASTRODUODENOSCOPY      Medical History: Past Medical History:  Diagnosis Date  . Arthritis   . Headache   . History of chickenpox   . Hypertension   . Sleep apnea     Family History: Family History  Problem Relation Age of Onset  . Breast cancer Mother 64    Social History: Social History   Socioeconomic History  . Marital status: Married    Spouse name: Not on file  . Number of children: Not on file  . Years of education: Not on file  . Highest education level: Not on file  Occupational History  . Not on file  Social Needs  . Financial resource strain: Not on file  . Food insecurity    Worry: Not on file  Inability: Not on file  . Transportation needs    Medical: Not on file    Non-medical: Not on file  Tobacco Use  . Smoking status: Never Smoker  . Smokeless tobacco: Never Used  Substance and Sexual Activity  . Alcohol use: Yes    Comment: ocassionally  . Drug use: No  . Sexual activity: Not on file  Lifestyle  . Physical activity    Days per week: Not on file    Minutes per session: Not on file  . Stress: Not on file  Relationships  . Social Herbalist on phone: Not on file    Gets together: Not on file    Attends religious service: Not on  file    Active member of club or organization: Not on file    Attends meetings of clubs or organizations: Not on file    Relationship status: Not on file  . Intimate partner violence    Fear of current or ex partner: Not on file    Emotionally abused: Not on file    Physically abused: Not on file    Forced sexual activity: Not on file  Other Topics Concern  . Not on file  Social History Narrative  . Not on file    Vital Signs: Blood pressure 140/78, pulse 67, resp. rate 16, height 5\' 3"  (1.6 m), weight 213 lb (96.6 kg), SpO2 96 %.  Examination: General Appearance: The patient is well-developed, well-nourished, and in no distress. Skin: Gross inspection of skin unremarkable. Head: normocephalic, no gross deformities. Eyes: no gross deformities noted. ENT: ears appear grossly normal no exudates. Neck: Supple. No thyromegaly. No LAD. Respiratory: clear bilaterally. Cardiovascular: Normal S1 and S2 without murmur or rub. Extremities: No cyanosis. pulses are equal. Neurologic: Alert and oriented. No involuntary movements.  LABS: Recent Results (from the past 2160 hour(s))  Urinalysis, Routine w reflex microscopic     Status: Abnormal   Collection Time: 04/22/19  3:33 AM  Result Value Ref Range   Specific Gravity, UA 1.017 1.005 - 1.030   pH, UA 7.0 5.0 - 7.5   Color, UA Yellow Yellow   Appearance Ur Clear Clear   Leukocytes,UA Trace (A) Negative   Protein,UA Negative Negative/Trace   Glucose, UA Negative Negative   Ketones, UA Negative Negative   RBC, UA Negative Negative   Bilirubin, UA Negative Negative   Urobilinogen, Ur 1.0 0.2 - 1.0 mg/dL   Nitrite, UA Negative Negative   Microscopic Examination See below:     Comment: Microscopic was indicated and was performed.  Microscopic Examination     Status: None   Collection Time: 04/22/19  3:33 AM   URINE  Result Value Ref Range   WBC, UA 0-5 0 - 5 /hpf   RBC 0-2 0 - 2 /hpf   Epithelial Cells (non renal) 0-10 0 - 10  /hpf   Casts None seen None seen /lpf   Mucus, UA Present Not Estab.   Bacteria, UA None seen None seen/Few    Radiology: Mm 3d Screen Breast Bilateral  Result Date: 05/04/2019 CLINICAL DATA:  Screening. EXAM: DIGITAL SCREENING BILATERAL MAMMOGRAM WITH TOMO AND CAD COMPARISON:  Previous exam(s). ACR Breast Density Category c: The breast tissue is heterogeneously dense, which may obscure small masses. FINDINGS: There are no findings suspicious for malignancy. Images were processed with CAD. IMPRESSION: No mammographic evidence of malignancy. A result letter of this screening mammogram will be mailed directly to the patient. RECOMMENDATION: Screening  mammogram in one year. (Code:SM-B-01Y) BI-RADS CATEGORY  1: Negative. Electronically Signed   By: Curlene Dolphin M.D.   On: 05/04/2019 10:55    No results found.  No results found.    Assessment and Plan: Patient Active Problem List   Diagnosis Date Noted  . Urinary incontinence 04/25/2019  . Need for vaccination against Streptococcus pneumoniae using pneumococcal conjugate vaccine 13 06/30/2018  . Rash 04/14/2018  . Vaginal candidiasis 04/14/2018  . Stomatitis and mucositis 04/14/2018  . Other atopic dermatitis 04/14/2018  . Nausea 04/14/2018  . Encounter for general adult medical examination with abnormal findings 12/29/2017  . Essential hypertension 12/29/2017  . Mixed hyperlipidemia 12/29/2017  . Dysuria 12/29/2017  . Non-seasonal allergic rhinitis due to pollen 12/29/2017  . Unspecified menopausal and perimenopausal disorder 12/29/2017  . Urinary tract infection with hematuria 12/29/2017  . Neoplasm of uncertain behavior of skin of lower extremity 12/29/2017  . Plantar fasciitis 09/11/2015  . Paronychia 08/25/2014    1. OSA on CPAP New Rx for supplies sent.  Continue to wear cpap as discussed.   2. Obstructive chronic bronchitis without exacerbation (HCC) Stable, continue to monitor.  3. Essential hypertension Stable,  controlled.  Continue current therapy.  4. Morbid obesity ([place]) Obesity Counseling: Risk Assessment: An assessment of behavioral risk factors was made today and includes lack of exercise sedentary lifestyle, lack of portion control and poor dietary habits.  Risk Modification Advice: She was counseled on portion control guidelines. Restricting daily caloric intake to. . The detrimental long term effects of obesity on her health and ongoing poor compliance was also discussed with the patient.    General Counseling: I have discussed the findings of the evaluation and examination with Ashley Mckay.  I have also discussed any further diagnostic evaluation thatmay be needed or ordered today. Ashley Mckay verbalizes understanding of the findings of todays visit. We also reviewed her medications today and discussed drug interactions and side effects including but not limited excessive drowsiness and altered mental states. We also discussed that there is always a risk not just to her but also people around her. she has been encouraged to call the office with any questions or concerns that should arise related to todays visit.    Time spent: 15 This patient was seen by Orson Gear AGNP-C in Collaboration with Dr. Devona Konig as a part of collaborative care agreement.   I have personally obtained a history, examined the patient, evaluated laboratory and imaging results, formulated the assessment and plan and placed orders.    Allyne Gee, MD [facility] Pulmonary and Critical Care Sleep medicine

## 2019-06-08 DIAGNOSIS — R69 Illness, unspecified: Secondary | ICD-10-CM | POA: Diagnosis not present

## 2019-06-15 ENCOUNTER — Other Ambulatory Visit: Payer: Self-pay | Admitting: Nurse Practitioner

## 2019-06-15 DIAGNOSIS — R69 Illness, unspecified: Secondary | ICD-10-CM | POA: Diagnosis not present

## 2019-06-15 DIAGNOSIS — E559 Vitamin D deficiency, unspecified: Secondary | ICD-10-CM | POA: Diagnosis not present

## 2019-06-15 DIAGNOSIS — I10 Essential (primary) hypertension: Secondary | ICD-10-CM | POA: Diagnosis not present

## 2019-06-15 DIAGNOSIS — Z0001 Encounter for general adult medical examination with abnormal findings: Secondary | ICD-10-CM | POA: Diagnosis not present

## 2019-06-15 DIAGNOSIS — E782 Mixed hyperlipidemia: Secondary | ICD-10-CM | POA: Diagnosis not present

## 2019-06-16 ENCOUNTER — Ambulatory Visit: Payer: Medicare HMO | Admitting: Internal Medicine

## 2019-06-16 ENCOUNTER — Other Ambulatory Visit: Payer: Self-pay

## 2019-06-16 DIAGNOSIS — R0602 Shortness of breath: Secondary | ICD-10-CM

## 2019-06-16 DIAGNOSIS — J449 Chronic obstructive pulmonary disease, unspecified: Secondary | ICD-10-CM

## 2019-06-16 LAB — COMPREHENSIVE METABOLIC PANEL
ALT: 10 IU/L (ref 0–32)
AST: 17 IU/L (ref 0–40)
Albumin/Globulin Ratio: 1.8 (ref 1.2–2.2)
Albumin: 4.4 g/dL (ref 3.8–4.8)
Alkaline Phosphatase: 107 IU/L (ref 39–117)
BUN/Creatinine Ratio: 24 (ref 12–28)
BUN: 16 mg/dL (ref 8–27)
Bilirubin Total: 0.4 mg/dL (ref 0.0–1.2)
CO2: 26 mmol/L (ref 20–29)
Calcium: 9.6 mg/dL (ref 8.7–10.3)
Chloride: 103 mmol/L (ref 96–106)
Creatinine, Ser: 0.68 mg/dL (ref 0.57–1.00)
GFR calc Af Amer: 105 mL/min/{1.73_m2} (ref >59)
GFR calc non Af Amer: 91 mL/min/{1.73_m2} (ref >59)
Globulin, Total: 2.5 g/dL (ref 1.5–4.5)
Glucose: 98 mg/dL (ref 65–99)
Potassium: 4.7 mmol/L (ref 3.5–5.2)
Sodium: 143 mmol/L (ref 134–144)
Total Protein: 6.9 g/dL (ref 6.0–8.5)

## 2019-06-16 LAB — CBC
Hematocrit: 43.1 % (ref 34.0–46.6)
Hemoglobin: 14.2 g/dL (ref 11.1–15.9)
MCH: 30.9 pg (ref 26.6–33.0)
MCHC: 32.9 g/dL (ref 31.5–35.7)
MCV: 94 fL (ref 79–97)
Platelets: 259 10*3/uL (ref 150–450)
RBC: 4.59 x10E6/uL (ref 3.77–5.28)
RDW: 12.4 % (ref 11.7–15.4)
WBC: 8 10*3/uL (ref 3.4–10.8)

## 2019-06-16 LAB — LIPID PANEL W/O CHOL/HDL RATIO
Cholesterol, Total: 161 mg/dL (ref 100–199)
HDL: 52 mg/dL (ref >39)
LDL Chol Calc (NIH): 81 mg/dL (ref 0–99)
Triglycerides: 164 mg/dL — ABNORMAL HIGH (ref 0–149)
VLDL Cholesterol Cal: 28 mg/dL (ref 5–40)

## 2019-06-16 LAB — HCV AB W REFLEX TO QUANT PCR: HCV Ab: <0.1 s/co ratio (ref 0.0–0.9)

## 2019-06-16 LAB — T4, FREE: Free T4: 1.16 ng/dL (ref 0.82–1.77)

## 2019-06-16 LAB — TSH: TSH: 2.14 u[IU]/mL (ref 0.450–4.500)

## 2019-06-16 LAB — VITAMIN D 25 HYDROXY (VIT D DEFICIENCY, FRACTURES): Vit D, 25-Hydroxy: 24.9 ng/mL — ABNORMAL LOW (ref 30.0–100.0)

## 2019-06-16 LAB — HCV INTERPRETATION

## 2019-06-17 NOTE — Progress Notes (Signed)
Mild vitamin d deficiency. Review at next visit 06/25/2019

## 2019-06-25 ENCOUNTER — Other Ambulatory Visit: Payer: Self-pay

## 2019-06-25 ENCOUNTER — Ambulatory Visit (INDEPENDENT_AMBULATORY_CARE_PROVIDER_SITE_OTHER): Payer: Medicare HMO | Admitting: Nurse Practitioner

## 2019-06-25 ENCOUNTER — Encounter: Payer: Self-pay | Admitting: Nurse Practitioner

## 2019-06-25 VITALS — BP 119/66 | HR 74 | Temp 97.0°F | Resp 16 | Ht 63.0 in | Wt 213.0 lb

## 2019-06-25 DIAGNOSIS — R32 Unspecified urinary incontinence: Secondary | ICD-10-CM

## 2019-06-25 DIAGNOSIS — I10 Essential (primary) hypertension: Secondary | ICD-10-CM

## 2019-06-25 DIAGNOSIS — E782 Mixed hyperlipidemia: Secondary | ICD-10-CM

## 2019-06-25 NOTE — Progress Notes (Signed)
[facility] [place], [place] [address]  Internal MEDICINE  Office Visit Note  Patient Name: Ashley Mckay  [id number]  [id number]  Date of Service: 06/25/2019  Chief Complaint  Patient presents with  . Follow-up    review labs and added ditropan, wake up with dry mouth and gets sore throat sometimes, pt does drink a lot fo water    The patient is here for follow up visit. She did have labs done. Overall, labs wre good. Triglycerides were mildly elevated, however, LDL and total cholesterol are normal. She also had mild vitamin d deficiency. Today, the patient has no new concerns or complaints. Was started on ditropan XL to help with overactive bladder. She states that she is doing well on this medication. She takes it in the evenings. Only issue she has with it is dry mouth when she first gets up in the mornings       Current Medication: Outpatient Encounter Medications as of 06/25/2019  Medication Sig Note  . Docusate Calcium (STOOL SOFTENER PO) Take by mouth.   Marland Kitchen ibuprofen (ADVIL,MOTRIN) 800 MG tablet 800 mg every 8 (eight) hours as needed.  06/21/2014: Received from: External Pharmacy  . levocetirizine (XYZAL) 5 MG tablet Take 1 tablet (5 mg total) by mouth every evening.   . meloxicam (MOBIC) 7.5 MG tablet Take 1 tablet (7.5 mg total) by mouth daily. (Patient taking differently: Take 7.5 mg by mouth as needed. )   . ondansetron (ZOFRAN) 4 MG tablet Take 1 tablet (4 mg total) by mouth every 8 (eight) hours as needed for nausea or vomiting.   Marland Kitchen oxybutynin (DITROPAN-XL) 10 MG 24 hr tablet Take 1 tablet (10 mg total) by mouth at bedtime.   . propranolol ER (INDERAL LA) 80 MG 24 hr capsule Take 1 capsule (80 mg total) by mouth daily.   . rosuvastatin (CRESTOR) 10 MG tablet Take 1 tablet (10 mg total) by mouth daily.   Marland Kitchen triamcinolone cream (KENALOG) 0.1 % Apply 1 application topically 2 (two) times daily.   Marland Kitchen VITAMIN D, CHOLECALCIFEROL, PO Take by mouth daily.     No facility-administered encounter medications on file as of 06/25/2019.     Surgical History: Past Surgical History:  Procedure Laterality Date  . COLONOSCOPY    . COLONOSCOPY WITH PROPOFOL N/A 01/31/2016   Procedure: COLONOSCOPY WITH PROPOFOL;  Surgeon: Manya Silvas, MD;  Location: [facility] ENDOSCOPY;  Service: Endoscopy;  Laterality: N/A;  . ESOPHAGOGASTRODUODENOSCOPY      Medical History: Past Medical History:  Diagnosis Date  . Arthritis   . Headache   . History of chickenpox   . Hypertension   . Sleep apnea     Family History: Family History  Problem Relation Age of Onset  . Breast cancer Mother 32    Social History   Socioeconomic History  . Marital status: Married    Spouse name: Not on file  . Number of children: Not on file  . Years of education: Not on file  . Highest education level: Not on file  Occupational History  . Not on file  Social Needs  . Financial resource strain: Not on file  . Food insecurity    Worry: Not on file    Inability: Not on file  . Transportation needs    Medical: Not on file    Non-medical: Not on file  Tobacco Use  . Smoking status: Never Smoker  . Smokeless tobacco: Never Used  Substance and  Sexual Activity  . Alcohol use: Yes    Comment: ocassionally  . Drug use: No  . Sexual activity: Not on file  Lifestyle  . Physical activity    Days per week: Not on file    Minutes per session: Not on file  . Stress: Not on file  Relationships  . Social Herbalist on phone: Not on file    Gets together: Not on file    Attends religious service: Not on file    Active member of club or organization: Not on file    Attends meetings of clubs or organizations: Not on file    Relationship status: Not on file  . Intimate partner violence    Fear of current or ex partner: Not on file    Emotionally abused: Not on file    Physically abused: Not on file    Forced sexual activity: Not on file  Other Topics Concern   . Not on file  Social History Narrative  . Not on file      Review of Systems  Constitutional: Negative for chills, fatigue and unexpected weight change.  HENT: Negative for congestion, postnasal drip, rhinorrhea, sneezing and sore throat.   Eyes: Negative.   Respiratory: Negative for cough, chest tightness, shortness of breath and wheezing.   Cardiovascular: Negative for chest pain and palpitations.  Gastrointestinal: Negative for abdominal pain, constipation, diarrhea, nausea and vomiting.  Endocrine: Negative for cold intolerance, heat intolerance, polydipsia and polyuria.  Genitourinary: Positive for frequency.       Nocturia and urinary frequency has improved some since she started ditropan.   Musculoskeletal: Negative for arthralgias, back pain, joint swelling and neck pain.  Skin: Negative for rash.  Allergic/Immunologic: Positive for environmental allergies.  Neurological: Negative for dizziness, tremors, numbness and headaches.  Hematological: Negative for adenopathy. Does not bruise/bleed easily.  Psychiatric/Behavioral: Negative for behavioral problems (Depression), dysphoric mood, sleep disturbance and suicidal ideas. The patient is not nervous/anxious.     Today's Vitals   06/25/19 1035  BP: 119/66  Pulse: 74  Resp: 16  Temp: (!) 97 F (36.1 C)  SpO2: 96%  Weight: 213 lb (96.6 kg)  Height: 5\' 3"  (1.6 m)   Body mass index is 37.73 kg/m.  Physical Exam Vitals signs and nursing note reviewed.  Constitutional:      General: She is not in acute distress.    Appearance: Normal appearance. She is well-developed. She is not diaphoretic.  HENT:     Head: Normocephalic and atraumatic.     Mouth/Throat:     Pharynx: No oropharyngeal exudate.  Eyes:     Pupils: Pupils are equal, round, and reactive to light.  Neck:     Musculoskeletal: Normal range of motion and neck supple.     Thyroid: No thyromegaly.     Vascular: No JVD.     Trachea: No tracheal deviation.   Cardiovascular:     Rate and Rhythm: Normal rate and regular rhythm.     Heart sounds: Normal heart sounds. No murmur. No friction rub. No gallop.   Pulmonary:     Effort: Pulmonary effort is normal. No respiratory distress.     Breath sounds: Normal breath sounds. No wheezing or rales.  Chest:     Chest wall: No tenderness.  Abdominal:     Palpations: Abdomen is soft.  Musculoskeletal: Normal range of motion.  Lymphadenopathy:     Cervical: No cervical adenopathy.  Skin:    General:  Skin is warm and dry.  Neurological:     Mental Status: She is alert and oriented to person, place, and time.     Cranial Nerves: No cranial nerve deficit.  Psychiatric:        Behavior: Behavior normal.        Thought Content: Thought content normal.        Judgment: Judgment normal.    Assessment/Plan: 1. Essential hypertension Well controlled. Continue propranolol as prescribed.   2. Urinary incontinence, unspecified type Improved. Recommended she try taking ditropan during the day to prevent waking up with dry mouth. If no improvement, will g back t taking at night.   3. Mixed hyperlipidemia Lipid panel stable. Will continue to monitor.   General Counseling: Deanza verbalizes understanding of the findings of todays visit and agrees with plan of treatment. I have discussed any further diagnostic evaluation that may be needed or ordered today. We also reviewed her medications today. she has been encouraged to call the office with any questions or concerns that should arise related to todays visit.   Hypertension Counseling:   The following hypertensive lifestyle modification were recommended and discussed:  1. Limiting alcohol intake to less than 1 oz/day of ethanol:(24 oz of beer or 8 oz of wine or 2 oz of 100-proof whiskey). 2. Take baby ASA 81 mg daily. 3. Importance of regular aerobic exercise and losing weight. 4. Reduce dietary saturated fat and cholesterol intake for overall  cardiovascular health. 5. Maintaining adequate dietary potassium, calcium, and magnesium intake. 6. Regular monitoring of the blood pressure. 7. Reduce sodium intake to less than 100 mmol/day (less than 2.3 gm of sodium or less than 6 gm of sodium choride)   This patient was seen by [place] with Dr Lavera Guise as a part of collaborative care agreement   Time spent: 42 Minutes      Dr Lavera Guise Internal medicine

## 2019-07-06 ENCOUNTER — Other Ambulatory Visit: Payer: Self-pay | Admitting: Internal Medicine

## 2019-07-06 DIAGNOSIS — J449 Chronic obstructive pulmonary disease, unspecified: Secondary | ICD-10-CM

## 2019-07-06 NOTE — Progress Notes (Signed)
pf

## 2019-07-07 LAB — PULMONARY FUNCTION TEST

## 2019-07-19 ENCOUNTER — Ambulatory Visit: Payer: Medicare HMO | Admitting: Internal Medicine

## 2019-07-22 ENCOUNTER — Telehealth: Payer: Self-pay

## 2019-07-22 NOTE — Telephone Encounter (Signed)
Confirmed appointment with patient. klh °

## 2019-07-26 ENCOUNTER — Encounter: Payer: Self-pay | Admitting: Internal Medicine

## 2019-07-26 ENCOUNTER — Ambulatory Visit: Payer: Medicare HMO | Admitting: Internal Medicine

## 2019-07-26 ENCOUNTER — Other Ambulatory Visit: Payer: Self-pay

## 2019-07-26 VITALS — BP 141/76 | HR 70 | Temp 97.4°F | Resp 16 | Ht 63.0 in | Wt 211.0 lb

## 2019-07-26 DIAGNOSIS — Z9989 Dependence on other enabling machines and devices: Secondary | ICD-10-CM | POA: Diagnosis not present

## 2019-07-26 DIAGNOSIS — J4520 Mild intermittent asthma, uncomplicated: Secondary | ICD-10-CM

## 2019-07-26 DIAGNOSIS — G4733 Obstructive sleep apnea (adult) (pediatric): Secondary | ICD-10-CM | POA: Diagnosis not present

## 2019-07-26 DIAGNOSIS — J301 Allergic rhinitis due to pollen: Secondary | ICD-10-CM

## 2019-07-26 NOTE — Progress Notes (Signed)
[facility] [place],  [address]  Pulmonary Sleep Medicine   Office Visit Note  Patient Name: Ashley Mckay DOB: 1951-04-28 MRN [id number]  Date of Service: 07/26/2019  Complaints/HPI: She is doing well overall. She had a PFT done which looks normal. She did not do well with the post bronchodilator portion which was likely due to effort. She states she is losing weight. She has been walking and also monitoring her diet. She states she has lost 9-10 pounds and plans on continuing to do so. Patient has no cough noted and denies having any CP. No admissions to the hospital. No covid exposure and states tha tshe is using her mask as suggested  ROS  General: (-) fever, (-) chills, (-) night sweats, (-) weakness Skin: (-) rashes, (-) itching,. Eyes: (-) visual changes, (-) redness, (-) itching. Nose and Sinuses: (-) nasal stuffiness or itchiness, (-) postnasal drip, (-) nosebleeds, (-) sinus trouble. Mouth and Throat: (-) sore throat, (-) hoarseness. Neck: (-) swollen glands, (-) enlarged thyroid, (-) neck pain. Respiratory: - cough, (-) bloody sputum, - shortness of breath, - wheezing. Cardiovascular: - ankle swelling, (-) chest pain. Lymphatic: (-) lymph node enlargement. Neurologic: (-) numbness, (-) tingling. Psychiatric: (-) anxiety, (-) depression   Current Medication: Outpatient Encounter Medications as of 07/26/2019  Medication Sig Note  . Docusate Calcium (STOOL SOFTENER PO) Take by mouth.   Marland Kitchen ibuprofen (ADVIL,MOTRIN) 800 MG tablet 800 mg every 8 (eight) hours as needed.  06/21/2014: Received from: External Pharmacy  . levocetirizine (XYZAL) 5 MG tablet Take 1 tablet (5 mg total) by mouth every evening.   . meloxicam (MOBIC) 7.5 MG tablet Take 1 tablet (7.5 mg total) by mouth daily. (Patient taking differently: Take 7.5 mg by mouth as needed. )   . ondansetron (ZOFRAN) 4 MG tablet Take 1 tablet (4 mg total) by mouth every 8 (eight) hours as  needed for nausea or vomiting.   Marland Kitchen oxybutynin (DITROPAN-XL) 10 MG 24 hr tablet Take 1 tablet (10 mg total) by mouth at bedtime.   . propranolol ER (INDERAL LA) 80 MG 24 hr capsule Take 1 capsule (80 mg total) by mouth daily.   . rosuvastatin (CRESTOR) 10 MG tablet Take 1 tablet (10 mg total) by mouth daily.   Marland Kitchen triamcinolone cream (KENALOG) 0.1 % Apply 1 application topically 2 (two) times daily.   Marland Kitchen VITAMIN D, CHOLECALCIFEROL, PO Take by mouth daily.    No facility-administered encounter medications on file as of 07/26/2019.     Surgical History: Past Surgical History:  Procedure Laterality Date  . COLONOSCOPY    . COLONOSCOPY WITH PROPOFOL N/A 01/31/2016   Procedure: COLONOSCOPY WITH PROPOFOL;  Surgeon: Manya Silvas, MD;  Location: [facility] ENDOSCOPY;  Service: Endoscopy;  Laterality: N/A;  . ESOPHAGOGASTRODUODENOSCOPY      Medical History: Past Medical History:  Diagnosis Date  . Arthritis   . Headache   . History of chickenpox   . Hypertension   . Sleep apnea     Family History: Family History  Problem Relation Age of Onset  . Breast cancer Mother 65    Social History: Social History   Socioeconomic History  . Marital status: Married    Spouse name: Not on file  . Number of children: Not on file  . Years of education: Not on file  . Highest education level: Not on file  Occupational History  . Not on file  Social Needs  . Financial resource strain:  Not on file  . Food insecurity    Worry: Not on file    Inability: Not on file  . Transportation needs    Medical: Not on file    Non-medical: Not on file  Tobacco Use  . Smoking status: Never Smoker  . Smokeless tobacco: Never Used  Substance and Sexual Activity  . Alcohol use: Yes    Comment: ocassionally  . Drug use: No  . Sexual activity: Not on file  Lifestyle  . Physical activity    Days per week: Not on file    Minutes per session: Not on file  . Stress: Not on file  Relationships  . Social  Herbalist on phone: Not on file    Gets together: Not on file    Attends religious service: Not on file    Active member of club or organization: Not on file    Attends meetings of clubs or organizations: Not on file    Relationship status: Not on file  . Intimate partner violence    Fear of current or ex partner: Not on file    Emotionally abused: Not on file    Physically abused: Not on file    Forced sexual activity: Not on file  Other Topics Concern  . Not on file  Social History Narrative  . Not on file    Vital Signs: Blood pressure (!) 141/76, pulse 70, temperature (!) 97.4 F (36.3 C), resp. rate 16, height 5\' 3"  (1.6 m), weight 211 lb (95.7 kg), SpO2 94 %.  Examination: General Appearance: The patient is well-developed, well-nourished, and in no distress. Skin: Gross inspection of skin unremarkable. Head: normocephalic, no gross deformities. Eyes: no gross deformities noted. ENT: ears appear grossly normal no exudates. Neck: Supple. No thyromegaly. No LAD. Respiratory: no rhonchi noted. Cardiovascular: Normal S1 and S2 without murmur or rub. Extremities: No cyanosis. pulses are equal. Neurologic: Alert and oriented. No involuntary movements.  LABS: Recent Results (from the past 2160 hour(s))  Comprehensive metabolic panel     Status: None   Collection Time: 06/15/19 11:36 AM  Result Value Ref Range   Glucose 98 65 - 99 mg/dL   BUN 16 8 - 27 mg/dL   Creatinine, Ser 0.68 0.57 - 1.00 mg/dL   GFR calc non Af Amer 91 >59 mL/min/1.73   GFR calc Af Amer 105 >59 mL/min/1.73   BUN/Creatinine Ratio 24 12 - 28   Sodium 143 134 - 144 mmol/L   Potassium 4.7 3.5 - 5.2 mmol/L   Chloride 103 96 - 106 mmol/L   CO2 26 20 - 29 mmol/L   Calcium 9.6 8.7 - 10.3 mg/dL   Total Protein 6.9 6.0 - 8.5 g/dL   Albumin 4.4 3.8 - 4.8 g/dL   Globulin, Total 2.5 1.5 - 4.5 g/dL   Albumin/Globulin Ratio 1.8 1.2 - 2.2   Bilirubin Total 0.4 0.0 - 1.2 mg/dL   Alkaline Phosphatase  107 39 - 117 IU/L   AST 17 0 - 40 IU/L   ALT 10 0 - 32 IU/L  CBC     Status: None   Collection Time: 06/15/19 11:36 AM  Result Value Ref Range   WBC 8.0 3.4 - 10.8 x10E3/uL   RBC 4.59 3.77 - 5.28 x10E6/uL   Hemoglobin 14.2 11.1 - 15.9 g/dL   Hematocrit 43.1 34.0 - 46.6 %   MCV 94 79 - 97 fL   MCH 30.9 26.6 - 33.0 pg   MCHC  32.9 31.5 - 35.7 g/dL   RDW 12.4 11.7 - 15.4 %   Platelets 259 150 - 450 x10E3/uL  Lipid Panel w/o Chol/HDL Ratio     Status: Abnormal   Collection Time: 06/15/19 11:36 AM  Result Value Ref Range   Cholesterol, Total 161 100 - 199 mg/dL   Triglycerides 164 (H) 0 - 149 mg/dL   HDL 52 >39 mg/dL   VLDL Cholesterol Cal 28 5 - 40 mg/dL   LDL Chol Calc (NIH) 81 0 - 99 mg/dL  HCV Ab w Reflex to Quant PCR     Status: None   Collection Time: 06/15/19 11:36 AM  Result Value Ref Range   HCV Ab <0.1 0.0 - 0.9 s/co ratio  Interpretation:     Status: None   Collection Time: 06/15/19 11:36 AM  Result Value Ref Range   HCV Interp 1: Comment     Comment: Negative Not infected with HCV, unless recent infection is suspected or other evidence exists to indicate HCV infection.   T4, free     Status: None   Collection Time: 06/15/19 11:36 AM  Result Value Ref Range   Free T4 1.16 0.82 - 1.77 ng/dL  TSH     Status: None   Collection Time: 06/15/19 11:36 AM  Result Value Ref Range   TSH 2.140 0.450 - 4.500 uIU/mL  VITAMIN D 25 Hydroxy (Vit-D Deficiency, Fractures)     Status: Abnormal   Collection Time: 06/15/19 11:36 AM  Result Value Ref Range   Vit D, 25-Hydroxy 24.9 (L) 30.0 - 100.0 ng/mL    Comment: Vitamin D deficiency has been defined by the [place] and an Endocrine Society practice guideline as a level of serum 25-OH vitamin D less than 20 ng/mL (1,2). The Endocrine Society went on to further define vitamin D insufficiency as a level between 21 and 29 ng/mL (2). 1. IOM (Institute of Medicine). 2010. Dietary reference    intakes for calcium and D.  [place]: The    [employer]. 2. Holick MF, Binkley Fidelity, Bischoff-Ferrari HA, et al.    Evaluation, treatment, and prevention of vitamin D    deficiency: an Endocrine Society clinical practice    guideline. JCEM. 2011 Jul; 96(7):1911-30.   Pulmonary Function Test     Status: None   Collection Time: 07/07/19  2:25 PM  Result Value Ref Range   FEV1     FVC     FEV1/FVC     TLC     DLCO      Radiology: Mm 3d Screen Breast Bilateral  Result Date: 05/04/2019 CLINICAL DATA:  Screening. EXAM: DIGITAL SCREENING BILATERAL MAMMOGRAM WITH TOMO AND CAD COMPARISON:  Previous exam(s). ACR Breast Density Category c: The breast tissue is heterogeneously dense, which may obscure small masses. FINDINGS: There are no findings suspicious for malignancy. Images were processed with CAD. IMPRESSION: No mammographic evidence of malignancy. A result letter of this screening mammogram will be mailed directly to the patient. RECOMMENDATION: Screening mammogram in one year. (Code:SM-B-01Y) BI-RADS CATEGORY  1: Negative. Electronically Signed   By: Curlene Dolphin M.D.   On: 05/04/2019 10:55    No results found.  No results found.    Assessment and Plan: Patient Active Problem List   Diagnosis Date Noted  . Urinary incontinence 04/25/2019  . Need for vaccination against Streptococcus pneumoniae using pneumococcal conjugate vaccine 13 06/30/2018  . Rash 04/14/2018  . Vaginal candidiasis 04/14/2018  . Stomatitis and mucositis 04/14/2018  .  Other atopic dermatitis 04/14/2018  . Nausea 04/14/2018  . Encounter for general adult medical examination with abnormal findings 12/29/2017  . Essential hypertension 12/29/2017  . Mixed hyperlipidemia 12/29/2017  . Dysuria 12/29/2017  . Non-seasonal allergic rhinitis due to pollen 12/29/2017  . Unspecified menopausal and perimenopausal disorder 12/29/2017  . Urinary tract infection with hematuria 12/29/2017  . Neoplasm of uncertain behavior of skin  of lower extremity 12/29/2017  . Plantar fasciitis 09/11/2015  . Paronychia 08/25/2014    1. OSA on CPAP and is doing very well with the CPAP. No side effects noted has been showing good tolerance 2. Chronic Asthma without exacerbation she states that is not using any inhalers. She feels that she does not need any inhalers at this time and is well controlled 3. Morbid Obesity working on weight loss doing well so far was given counseling 4. Allergic rhinitis she usesher allergy meds as prescribed  General Counseling: I have discussed the findings of the evaluation and examination with Ashley Mckay.  I have also discussed any further diagnostic evaluation thatmay be needed or ordered today. Ashley Mckay verbalizes understanding of the findings of todays visit. We also reviewed her medications today and discussed drug interactions and side effects including but not limited excessive drowsiness and altered mental states. We also discussed that there is always a risk not just to her but also people around her. she has been encouraged to call the office with any questions or concerns that should arise related to todays visit.    Time spent: 25min  I have personally obtained a history, examined the patient, evaluated laboratory and imaging results, formulated the assessment and plan and placed orders.    Allyne Gee, MD [facility] Pulmonary and Critical Care Sleep medicine

## 2019-07-26 NOTE — Patient Instructions (Signed)

## 2019-07-28 NOTE — Progress Notes (Signed)
[employer] [place], [address]  DATE OF SERVICE: June 16, 2019  Complete Pulmonary Function Testing Interpretation:  FINDINGS:  The forced vital capacity is mildly decreased.  FEV1 is normal limits.  F1 FVC ratio is normal.  Postbronchodilator there is no significant improvement in the FEV1 clinical improvement may still occur.  Total lung capacity is mildly decreased residual volume is mildly decreased residual in total lung capacity ratio is decreased.  DLCO is normal.  IMPRESSION:  Pulmonary function study is suggestive of mild restrictive lung disease.  There does appears to be some effort related difference the flow volume loop clinical correlation is strongly recommended.  Allyne Gee, MD [facility] Pulmonary Critical Care Medicine Sleep Medicine

## 2019-10-12 ENCOUNTER — Other Ambulatory Visit: Payer: Self-pay

## 2019-10-12 DIAGNOSIS — R32 Unspecified urinary incontinence: Secondary | ICD-10-CM

## 2019-10-12 MED ORDER — OXYBUTYNIN CHLORIDE ER 10 MG PO TB24: 10.0000 mg | ORAL_TABLET | Freq: Every day | ORAL | 1 refills | Status: DC

## 2019-10-22 DIAGNOSIS — G4733 Obstructive sleep apnea (adult) (pediatric): Secondary | ICD-10-CM | POA: Diagnosis not present

## 2019-12-24 ENCOUNTER — Ambulatory Visit: Payer: Medicare HMO | Admitting: Nurse Practitioner

## 2020-01-05 ENCOUNTER — Telehealth: Payer: Self-pay

## 2020-01-05 NOTE — Telephone Encounter (Signed)
Confirmed and screened for 01-07-20 ov. °

## 2020-01-07 ENCOUNTER — Encounter: Payer: Self-pay | Admitting: Nurse Practitioner

## 2020-01-07 ENCOUNTER — Ambulatory Visit (INDEPENDENT_AMBULATORY_CARE_PROVIDER_SITE_OTHER): Payer: Medicare HMO | Admitting: Nurse Practitioner

## 2020-01-07 ENCOUNTER — Other Ambulatory Visit: Payer: Self-pay

## 2020-01-07 VITALS — BP 115/74 | HR 69 | Temp 97.4°F | Resp 16 | Ht 63.0 in | Wt 215.8 lb

## 2020-01-07 DIAGNOSIS — Z9989 Dependence on other enabling machines and devices: Secondary | ICD-10-CM | POA: Diagnosis not present

## 2020-01-07 DIAGNOSIS — R32 Unspecified urinary incontinence: Secondary | ICD-10-CM

## 2020-01-07 DIAGNOSIS — E782 Mixed hyperlipidemia: Secondary | ICD-10-CM

## 2020-01-07 DIAGNOSIS — G4733 Obstructive sleep apnea (adult) (pediatric): Secondary | ICD-10-CM | POA: Diagnosis not present

## 2020-01-07 DIAGNOSIS — I10 Essential (primary) hypertension: Secondary | ICD-10-CM | POA: Diagnosis not present

## 2020-01-07 NOTE — Progress Notes (Signed)
[facility] [place], [place] [address]  Internal MEDICINE  Office Visit Note  Patient Name: Ashley Mckay  [id number]  [id number]  Date of Service: 01/07/2020  Chief Complaint  Patient presents with  . Follow-up  . Hypertension    The patient is here for routine follow up. She states that she is doing well. Blood pressure is very well controlled. Her husband recently had heart valve replacement at [facility] last week. Overall, she states that her husband is recovering well. She has no concerns or complaints. She will be due to have routine, fasting labs prior to her next visit. Next visit should be health maintenance exam.       Current Medication: Outpatient Encounter Medications as of 01/07/2020  Medication Sig Note  . Docusate Calcium (STOOL SOFTENER PO) Take by mouth.   Marland Kitchen ibuprofen (ADVIL,MOTRIN) 800 MG tablet 800 mg every 8 (eight) hours as needed.  06/21/2014: Received from: External Pharmacy  . levocetirizine (XYZAL) 5 MG tablet Take 1 tablet (5 mg total) by mouth every evening.   . meloxicam (MOBIC) 7.5 MG tablet Take 1 tablet (7.5 mg total) by mouth daily. (Patient taking differently: Take 7.5 mg by mouth as needed. )   . ondansetron (ZOFRAN) 4 MG tablet Take 1 tablet (4 mg total) by mouth every 8 (eight) hours as needed for nausea or vomiting.   Marland Kitchen oxybutynin (DITROPAN-XL) 10 MG 24 hr tablet Take 1 tablet (10 mg total) by mouth at bedtime.   . propranolol ER (INDERAL LA) 80 MG 24 hr capsule Take 1 capsule (80 mg total) by mouth daily.   . rosuvastatin (CRESTOR) 10 MG tablet Take 1 tablet (10 mg total) by mouth daily.   Marland Kitchen triamcinolone cream (KENALOG) 0.1 % Apply 1 application topically 2 (two) times daily.   Marland Kitchen VITAMIN D, CHOLECALCIFEROL, PO Take by mouth daily.    No facility-administered encounter medications on file as of 01/07/2020.    Surgical History: Past Surgical History:  Procedure Laterality Date  . COLONOSCOPY    . COLONOSCOPY  WITH PROPOFOL N/A 01/31/2016   Procedure: COLONOSCOPY WITH PROPOFOL;  Surgeon: Manya Silvas, MD;  Location: [facility] ENDOSCOPY;  Service: Endoscopy;  Laterality: N/A;  . ESOPHAGOGASTRODUODENOSCOPY      Medical History: Past Medical History:  Diagnosis Date  . Arthritis   . Headache   . History of chickenpox   . Hypertension   . Sleep apnea     Family History: Family History  Problem Relation Age of Onset  . Breast cancer Mother 62    Social History   Socioeconomic History  . Marital status: Married    Spouse name: Not on file  . Number of children: Not on file  . Years of education: Not on file  . Highest education level: Not on file  Occupational History  . Not on file  Tobacco Use  . Smoking status: Never Smoker  . Smokeless tobacco: Never Used  Substance and Sexual Activity  . Alcohol use: Yes    Comment: ocassionally  . Drug use: No  . Sexual activity: Not on file  Other Topics Concern  . Not on file  Social History Narrative  . Not on file   Social Determinants of Health   Financial Resource Strain:   . Difficulty of Paying Living Expenses:   Food Insecurity:   . Worried About Charity fundraiser in the Last Year:   . [place] in the Last  Year:   Transportation Needs:   . Film/video editor (Medical):   Marland Kitchen Lack of Transportation (Non-Medical):   Physical Activity:   . Days of Exercise per Week:   . Minutes of Exercise per Session:   Stress:   . Feeling of Stress :   Social Connections:   . Frequency of Communication with Friends and Family:   . Frequency of Social Gatherings with Friends and Family:   . Attends Religious Services:   . Active Member of Clubs or Organizations:   . Attends Archivist Meetings:   Marland Kitchen Marital Status:   Intimate Partner Violence:   . Fear of Current or Ex-Partner:   . Emotionally Abused:   Marland Kitchen Physically Abused:   . Sexually Abused:       Review of Systems  Constitutional: Negative for chills,  fatigue and unexpected weight change.  HENT: Negative for congestion, postnasal drip, rhinorrhea, sneezing and sore throat.   Respiratory: Negative for cough, chest tightness, shortness of breath and wheezing.   Cardiovascular: Negative for chest pain and palpitations.  Gastrointestinal: Negative for abdominal pain, constipation, diarrhea, nausea and vomiting.  Endocrine: Negative for cold intolerance, heat intolerance, polydipsia and polyuria.  Musculoskeletal: Negative for arthralgias, back pain, joint swelling and neck pain.  Skin: Negative for rash.  Allergic/Immunologic: Positive for environmental allergies.  Neurological: Negative for dizziness, tremors, numbness and headaches.  Hematological: Negative for adenopathy. Does not bruise/bleed easily.  Psychiatric/Behavioral: Negative for behavioral problems (Depression), dysphoric mood, sleep disturbance and suicidal ideas. The patient is not nervous/anxious.    Today's Vitals   01/07/20 1108  BP: 115/74  Pulse: 69  Resp: 16  Temp: (!) 97.4 F (36.3 C)  SpO2: 92%  Weight: 215 lb 12.8 oz (97.9 kg)  Height: 5\' 3"  (1.6 m)   Body mass index is 38.23 kg/m.  Physical Exam Vitals and nursing note reviewed.  Constitutional:      General: She is not in acute distress.    Appearance: Normal appearance. She is well-developed. She is not diaphoretic.  HENT:     Head: Normocephalic and atraumatic.     Mouth/Throat:     Pharynx: No oropharyngeal exudate.  Eyes:     Pupils: Pupils are equal, round, and reactive to light.  Neck:     Thyroid: No thyromegaly.     Vascular: No JVD.     Trachea: No tracheal deviation.  Cardiovascular:     Rate and Rhythm: Normal rate and regular rhythm.     Heart sounds: Normal heart sounds. No murmur. No friction rub. No gallop.   Pulmonary:     Effort: Pulmonary effort is normal. No respiratory distress.     Breath sounds: Normal breath sounds. No wheezing or rales.  Chest:     Chest wall: No  tenderness.  Abdominal:     Palpations: Abdomen is soft.  Musculoskeletal:        General: Normal range of motion.     Cervical back: Normal range of motion and neck supple.  Lymphadenopathy:     Cervical: No cervical adenopathy.  Skin:    General: Skin is warm and dry.  Neurological:     Mental Status: She is alert and oriented to person, place, and time.     Cranial Nerves: No cranial nerve deficit.  Psychiatric:        Mood and Affect: Mood normal.        Behavior: Behavior normal.        Thought  Content: Thought content normal.        Judgment: Judgment normal.    Assessment/Plan:  1. Essential hypertension Stable. Continue bp medication as prescribed   2. Mixed hyperlipidemia Continue crestor as prescribed   3. OSA on CPAP Regular visits with Dr. Devona Konig for CPAP management   4. Urinary incontinence, unspecified type Continue ditropan as prescribed    General Counseling: Janiaya verbalizes understanding of the findings of todays visit and agrees with plan of treatment. I have discussed any further diagnostic evaluation that may be needed or ordered today. We also reviewed her medications today. she has been encouraged to call the office with any questions or concerns that should arise related to todays visit.  This patient was seen by Leretha Pol FNP Collaboration with Dr Lavera Guise as a part of collaborative care agreement  Total time spent: 20 Minutes   Time spent includes review of chart, medications, test results, and follow up plan with the patient.      Dr Lavera Guise Internal medicine

## 2020-01-20 ENCOUNTER — Telehealth: Payer: Self-pay

## 2020-01-20 NOTE — Telephone Encounter (Signed)
Confirmed appointment on 01/24/2020 and screened for covid. klh 

## 2020-01-24 ENCOUNTER — Telehealth: Payer: Self-pay

## 2020-01-24 ENCOUNTER — Ambulatory Visit: Payer: Medicare HMO | Admitting: Internal Medicine

## 2020-01-24 NOTE — Telephone Encounter (Signed)
Patient needed to cancel appointment on 01/24/2020 due to a medical emergency will call back to reschedule. klh

## 2020-02-29 DIAGNOSIS — G4733 Obstructive sleep apnea (adult) (pediatric): Secondary | ICD-10-CM | POA: Diagnosis not present

## 2020-03-17 DIAGNOSIS — Z0100 Encounter for examination of eyes and vision without abnormal findings: Secondary | ICD-10-CM | POA: Diagnosis not present

## 2020-03-17 DIAGNOSIS — H524 Presbyopia: Secondary | ICD-10-CM | POA: Diagnosis not present

## 2020-03-27 DIAGNOSIS — E785 Hyperlipidemia, unspecified: Secondary | ICD-10-CM | POA: Diagnosis not present

## 2020-03-27 DIAGNOSIS — Z008 Encounter for other general examination: Secondary | ICD-10-CM | POA: Diagnosis not present

## 2020-03-27 DIAGNOSIS — G4730 Sleep apnea, unspecified: Secondary | ICD-10-CM | POA: Diagnosis not present

## 2020-03-27 DIAGNOSIS — G43909 Migraine, unspecified, not intractable, without status migrainosus: Secondary | ICD-10-CM | POA: Diagnosis not present

## 2020-03-27 DIAGNOSIS — Z6841 Body Mass Index (BMI) 40.0 and over, adult: Secondary | ICD-10-CM | POA: Diagnosis not present

## 2020-03-27 DIAGNOSIS — J309 Allergic rhinitis, unspecified: Secondary | ICD-10-CM | POA: Diagnosis not present

## 2020-03-27 DIAGNOSIS — L409 Psoriasis, unspecified: Secondary | ICD-10-CM | POA: Diagnosis not present

## 2020-03-27 DIAGNOSIS — M1990 Unspecified osteoarthritis, unspecified site: Secondary | ICD-10-CM | POA: Diagnosis not present

## 2020-03-27 DIAGNOSIS — K5900 Constipation, unspecified: Secondary | ICD-10-CM | POA: Diagnosis not present

## 2020-03-27 DIAGNOSIS — G8929 Other chronic pain: Secondary | ICD-10-CM | POA: Diagnosis not present

## 2020-04-13 ENCOUNTER — Telehealth: Payer: Self-pay

## 2020-04-13 NOTE — Telephone Encounter (Signed)
LMOM for office visit 8/9

## 2020-04-17 ENCOUNTER — Other Ambulatory Visit: Payer: Self-pay

## 2020-04-17 ENCOUNTER — Encounter: Payer: Self-pay | Admitting: Internal Medicine

## 2020-04-17 ENCOUNTER — Ambulatory Visit: Payer: Medicare HMO | Admitting: Internal Medicine

## 2020-04-17 VITALS — BP 130/64 | HR 70 | Temp 97.8°F | Resp 16 | Ht 63.0 in | Wt 216.6 lb

## 2020-04-17 DIAGNOSIS — R011 Cardiac murmur, unspecified: Secondary | ICD-10-CM | POA: Diagnosis not present

## 2020-04-17 DIAGNOSIS — Z9989 Dependence on other enabling machines and devices: Secondary | ICD-10-CM | POA: Diagnosis not present

## 2020-04-17 DIAGNOSIS — J301 Allergic rhinitis due to pollen: Secondary | ICD-10-CM | POA: Diagnosis not present

## 2020-04-17 DIAGNOSIS — J4520 Mild intermittent asthma, uncomplicated: Secondary | ICD-10-CM

## 2020-04-17 DIAGNOSIS — G4733 Obstructive sleep apnea (adult) (pediatric): Secondary | ICD-10-CM

## 2020-04-17 NOTE — Progress Notes (Signed)
[facility] [place], [place] [address]  Pulmonary Sleep Medicine   Office Visit Note  Patient Name: Ashley Mckay DOB: 1950-12-18 MRN [id number]  Date of Service: 04/17/2020  Complaints/HPI: She is generally doing well.  Mom states that she has been having some difficulty with her weight loss issues.  She is using her CPAP and says that she has been tolerating the machine well.  She had some questions regarding her COPD explained to her that her last pulmonary functions were basically within normal limits she probably has chronic obstructive asthma and does not really have COPD.  Allergies have been under control as already does not work on her diet control and exercise.  She states she was also noted to have a murmur her nurse who visits her apparently noticed this.  We are going to further evaluated.  If present may need an echocardiogram.  ROS  General: (-) fever, (-) chills, (-) night sweats, (-) weakness Skin: (-) rashes, (-) itching,. Eyes: (-) visual changes, (-) redness, (-) itching. Nose and Sinuses: (-) nasal stuffiness or itchiness, (-) postnasal drip, (-) nosebleeds, (-) sinus trouble. Mouth and Throat: (-) sore throat, (-) hoarseness. Neck: (-) swollen glands, (-) enlarged thyroid, (-) neck pain. Respiratory: - cough, (-) bloody sputum, - shortness of breath, - wheezing. Cardiovascular: - ankle swelling, (-) chest pain. Faint murmur right sternal border Lymphatic: (-) lymph node enlargement. Neurologic: (-) numbness, (-) tingling. Psychiatric: (-) anxiety, (-) depression   Current Medication: Outpatient Encounter Medications as of 04/17/2020  Medication Sig Note  . Docusate Calcium (STOOL SOFTENER PO) Take by mouth.   Marland Kitchen ibuprofen (ADVIL,MOTRIN) 800 MG tablet 800 mg every 8 (eight) hours as needed.  06/21/2014: Received from: External Pharmacy  . levocetirizine (XYZAL) 5 MG tablet Take 1 tablet (5 mg total) by mouth every evening.   . meloxicam  (MOBIC) 7.5 MG tablet Take 1 tablet (7.5 mg total) by mouth daily. (Patient taking differently: Take 7.5 mg by mouth as needed. )   . ondansetron (ZOFRAN) 4 MG tablet Take 1 tablet (4 mg total) by mouth every 8 (eight) hours as needed for nausea or vomiting.   Marland Kitchen oxybutynin (DITROPAN-XL) 10 MG 24 hr tablet Take 1 tablet (10 mg total) by mouth at bedtime.   . propranolol ER (INDERAL LA) 80 MG 24 hr capsule Take 1 capsule (80 mg total) by mouth daily.   . rosuvastatin (CRESTOR) 10 MG tablet Take 1 tablet (10 mg total) by mouth daily.   Marland Kitchen triamcinolone cream (KENALOG) 0.1 % Apply 1 application topically 2 (two) times daily.   Marland Kitchen VITAMIN D, CHOLECALCIFEROL, PO Take by mouth daily.    No facility-administered encounter medications on file as of 04/17/2020.    Surgical History: Past Surgical History:  Procedure Laterality Date  . COLONOSCOPY    . COLONOSCOPY WITH PROPOFOL N/A 01/31/2016   Procedure: COLONOSCOPY WITH PROPOFOL;  Surgeon: Manya Silvas, MD;  Location: [facility] ENDOSCOPY;  Service: Endoscopy;  Laterality: N/A;  . ESOPHAGOGASTRODUODENOSCOPY      Medical History: Past Medical History:  Diagnosis Date  . Arthritis   . Headache   . History of chickenpox   . Hypertension   . Sleep apnea     Family History: Family History  Problem Relation Age of Onset  . Breast cancer Mother 39    Social History: Social History   Socioeconomic History  . Marital status: Married    Spouse name: Not on file  . Number  of children: Not on file  . Years of education: Not on file  . Highest education level: Not on file  Occupational History  . Not on file  Tobacco Use  . Smoking status: Never Smoker  . Smokeless tobacco: Never Used  Substance and Sexual Activity  . Alcohol use: Yes    Comment: ocassionally  . Drug use: No  . Sexual activity: Not on file  Other Topics Concern  . Not on file  Social History Narrative  . Not on file   Social Determinants of Health   Financial Resource  Strain:   . Difficulty of Paying Living Expenses:   Food Insecurity:   . Worried About Charity fundraiser in the Last Year:   . Arboriculturist in the Last Year:   Transportation Needs:   . Film/video editor (Medical):   Marland Kitchen Lack of Transportation (Non-Medical):   Physical Activity:   . Days of Exercise per Week:   . Minutes of Exercise per Session:   Stress:   . Feeling of Stress :   Social Connections:   . Frequency of Communication with Friends and Family:   . Frequency of Social Gatherings with Friends and Family:   . Attends Religious Services:   . Active Member of Clubs or Organizations:   . Attends Archivist Meetings:   Marland Kitchen Marital Status:   Intimate Partner Violence:   . Fear of Current or Ex-Partner:   . Emotionally Abused:   Marland Kitchen Physically Abused:   . Sexually Abused:     Vital Signs: Blood pressure 130/64, pulse 70, temperature 97.8 F (36.6 C), resp. rate 16, height 5\' 3"  (1.6 m), weight 216 lb 9.6 oz (98.2 kg), SpO2 94 %.  Examination: General Appearance: The patient is well-developed, well-nourished, and in no distress. Skin: Gross inspection of skin unremarkable. Head: normocephalic, no gross deformities. Eyes: no gross deformities noted. ENT: ears appear grossly normal no exudates. Neck: Supple. No thyromegaly. No LAD. Respiratory: no rhonchi noted noted at this time. Cardiovascular: Normal S1 and S2 without murmur or rub. Extremities: No cyanosis. pulses are equal. Neurologic: Alert and oriented. No involuntary movements.  LABS: No results found for this or any previous visit (from the past 2160 hour(s)).  Radiology: MM 3D SCREEN BREAST BILATERAL  Result Date: 05/04/2019 CLINICAL DATA:  Screening. EXAM: DIGITAL SCREENING BILATERAL MAMMOGRAM WITH TOMO AND CAD COMPARISON:  Previous exam(s). ACR Breast Density Category c: The breast tissue is heterogeneously dense, which may obscure small masses. FINDINGS: There are no findings suspicious for  malignancy. Images were processed with CAD. IMPRESSION: No mammographic evidence of malignancy. A result letter of this screening mammogram will be mailed directly to the patient. RECOMMENDATION: Screening mammogram in one year. (Code:SM-B-01Y) BI-RADS CATEGORY  1: Negative. Electronically Signed   By: Curlene Dolphin M.D.   On: 05/04/2019 10:55    No results found.  No results found.    Assessment and Plan: Patient Active Problem List   Diagnosis Date Noted  . OSA on CPAP 01/07/2020  . Urinary incontinence 04/25/2019  . Need for vaccination against Streptococcus pneumoniae using pneumococcal conjugate vaccine 13 06/30/2018  . Rash 04/14/2018  . Vaginal candidiasis 04/14/2018  . Stomatitis and mucositis 04/14/2018  . Other atopic dermatitis 04/14/2018  . Nausea 04/14/2018  . Encounter for general adult medical examination with abnormal findings 12/29/2017  . Essential hypertension 12/29/2017  . Mixed hyperlipidemia 12/29/2017  . Dysuria 12/29/2017  . Non-seasonal allergic rhinitis due to  pollen 12/29/2017  . Unspecified menopausal and perimenopausal disorder 12/29/2017  . Urinary tract infection with hematuria 12/29/2017  . Neoplasm of uncertain behavior of skin of lower extremity 12/29/2017  . Plantar fasciitis 09/11/2015  . Paronychia 08/25/2014    1. OSA using her CPAP device doing well with compliance. She has been having her sleep compliance evaluated and also is being evaluated with her insurance  2. Chronic Asthma. She had PFT done last year. We will continue to monitor. She does not have clinical diagnosis of COPD as such although she does have exposure to second hand smoke 3. Allergic Rhinitis under control no major issues at this time 4. Morbid obesity. She still is struggling with her weight. She states that she is going to try to get back to the gym 5. Murmur will get an echo to evaluate. Likely benign finding  General Counseling: I have discussed the findings of the  evaluation and examination with Raghad.  I have also discussed any further diagnostic evaluation thatmay be needed or ordered today. Asti verbalizes understanding of the findings of todays visit. We also reviewed her medications today and discussed drug interactions and side effects including but not limited excessive drowsiness and altered mental states. We also discussed that there is always a risk not just to her but also people around her. she has been encouraged to call the office with any questions or concerns that should arise related to todays visit.  Orders Placed This Encounter  Procedures  . ECHOCARDIOGRAM COMPLETE    Standing Status:   Future    Standing Expiration Date:   04/17/2021    Order Specific Question:   Where should this test be performed    Answer:   External    Order Specific Question:   Perflutren DEFINITY (image enhancing agent) should be administered unless hypersensitivity or allergy exist    Answer:   Administer Perflutren    Order Specific Question:   Is a special reader required? (athlete or structural heart)    Answer:   No    Order Specific Question:   Reason for exam-Echo    Answer:   Murmur  785.2 / R01.1     Time spent: 45min  I have personally obtained a history, examined the patient, evaluated laboratory and imaging results, formulated the assessment and plan and placed orders.    Allyne Gee, MD [facility] Pulmonary and Critical Care Sleep medicine

## 2020-04-17 NOTE — Patient Instructions (Signed)

## 2020-04-18 ENCOUNTER — Ambulatory Visit: Payer: Medicare HMO | Admitting: Internal Medicine

## 2020-04-18 ENCOUNTER — Other Ambulatory Visit: Payer: Self-pay | Admitting: Nurse Practitioner

## 2020-04-18 DIAGNOSIS — E782 Mixed hyperlipidemia: Secondary | ICD-10-CM | POA: Diagnosis not present

## 2020-04-18 DIAGNOSIS — I10 Essential (primary) hypertension: Secondary | ICD-10-CM | POA: Diagnosis not present

## 2020-04-18 DIAGNOSIS — Z0001 Encounter for general adult medical examination with abnormal findings: Secondary | ICD-10-CM | POA: Diagnosis not present

## 2020-04-18 DIAGNOSIS — E559 Vitamin D deficiency, unspecified: Secondary | ICD-10-CM | POA: Diagnosis not present

## 2020-04-19 LAB — COMPREHENSIVE METABOLIC PANEL
ALT: 12 IU/L (ref 0–32)
AST: 17 IU/L (ref 0–40)
Albumin/Globulin Ratio: 1.6 (ref 1.2–2.2)
Albumin: 4.4 g/dL (ref 3.8–4.8)
Alkaline Phosphatase: 98 IU/L (ref 48–121)
BUN/Creatinine Ratio: 22 (ref 12–28)
BUN: 14 mg/dL (ref 8–27)
Bilirubin Total: 0.5 mg/dL (ref 0.0–1.2)
CO2: 23 mmol/L (ref 20–29)
Calcium: 9.4 mg/dL (ref 8.7–10.3)
Chloride: 104 mmol/L (ref 96–106)
Creatinine, Ser: 0.63 mg/dL (ref 0.57–1.00)
GFR calc Af Amer: 108 mL/min/{1.73_m2} (ref >59)
GFR calc non Af Amer: 94 mL/min/{1.73_m2} (ref >59)
Globulin, Total: 2.8 g/dL (ref 1.5–4.5)
Glucose: 107 mg/dL — ABNORMAL HIGH (ref 65–99)
Potassium: 4.7 mmol/L (ref 3.5–5.2)
Sodium: 142 mmol/L (ref 134–144)
Total Protein: 7.2 g/dL (ref 6.0–8.5)

## 2020-04-19 LAB — LIPID PANEL WITH LDL/HDL RATIO
Cholesterol, Total: 167 mg/dL (ref 100–199)
HDL: 52 mg/dL (ref >39)
LDL Chol Calc (NIH): 83 mg/dL (ref 0–99)
LDL/HDL Ratio: 1.6 ratio (ref 0.0–3.2)
Triglycerides: 192 mg/dL — ABNORMAL HIGH (ref 0–149)
VLDL Cholesterol Cal: 32 mg/dL (ref 5–40)

## 2020-04-19 LAB — CBC
Hematocrit: 41.5 % (ref 34.0–46.6)
Hemoglobin: 14.3 g/dL (ref 11.1–15.9)
MCH: 32.1 pg (ref 26.6–33.0)
MCHC: 34.5 g/dL (ref 31.5–35.7)
MCV: 93 fL (ref 79–97)
Platelets: 240 10*3/uL (ref 150–450)
RBC: 4.45 x10E6/uL (ref 3.77–5.28)
RDW: 12.6 % (ref 11.7–15.4)
WBC: 7.8 10*3/uL (ref 3.4–10.8)

## 2020-04-19 LAB — T4, FREE: Free T4: 1.11 ng/dL (ref 0.82–1.77)

## 2020-04-19 LAB — TSH: TSH: 3.41 u[IU]/mL (ref 0.450–4.500)

## 2020-04-19 LAB — VITAMIN D 25 HYDROXY (VIT D DEFICIENCY, FRACTURES): Vit D, 25-Hydroxy: 20.5 ng/mL — ABNORMAL LOW (ref 30.0–100.0)

## 2020-04-20 ENCOUNTER — Telehealth: Payer: Self-pay

## 2020-04-20 NOTE — Telephone Encounter (Signed)
Confirmed and screened for 04-24-20 ov. 

## 2020-04-21 ENCOUNTER — Other Ambulatory Visit: Payer: Self-pay

## 2020-04-21 DIAGNOSIS — J301 Allergic rhinitis due to pollen: Secondary | ICD-10-CM

## 2020-04-21 DIAGNOSIS — E782 Mixed hyperlipidemia: Secondary | ICD-10-CM

## 2020-04-21 DIAGNOSIS — I10 Essential (primary) hypertension: Secondary | ICD-10-CM

## 2020-04-21 DIAGNOSIS — R32 Unspecified urinary incontinence: Secondary | ICD-10-CM

## 2020-04-21 MED ORDER — OXYBUTYNIN CHLORIDE ER 10 MG PO TB24: 10.0000 mg | ORAL_TABLET | Freq: Every day | ORAL | 1 refills | Status: DC

## 2020-04-21 MED ORDER — PROPRANOLOL HCL ER 80 MG PO CP24: 80.0000 mg | ORAL_CAPSULE | Freq: Every day | ORAL | 3 refills | Status: DC

## 2020-04-21 MED ORDER — ROSUVASTATIN CALCIUM 10 MG PO TABS: 10.0000 mg | ORAL_TABLET | Freq: Every day | ORAL | 3 refills | Status: DC

## 2020-04-21 MED ORDER — LEVOCETIRIZINE DIHYDROCHLORIDE 5 MG PO TABS: 5.0000 mg | ORAL_TABLET | Freq: Every evening | ORAL | 3 refills | Status: DC

## 2020-04-24 ENCOUNTER — Other Ambulatory Visit: Payer: Self-pay

## 2020-04-24 ENCOUNTER — Ambulatory Visit (INDEPENDENT_AMBULATORY_CARE_PROVIDER_SITE_OTHER): Payer: Medicare HMO | Admitting: Nurse Practitioner

## 2020-04-24 ENCOUNTER — Encounter: Payer: Self-pay | Admitting: Nurse Practitioner

## 2020-04-24 VITALS — BP 123/74 | HR 78 | Temp 97.5°F | Resp 16 | Ht 63.0 in | Wt 218.0 lb

## 2020-04-24 DIAGNOSIS — Z1231 Encounter for screening mammogram for malignant neoplasm of breast: Secondary | ICD-10-CM | POA: Insufficient documentation

## 2020-04-24 DIAGNOSIS — R300 Dysuria: Secondary | ICD-10-CM | POA: Diagnosis not present

## 2020-04-24 DIAGNOSIS — G4733 Obstructive sleep apnea (adult) (pediatric): Secondary | ICD-10-CM | POA: Diagnosis not present

## 2020-04-24 DIAGNOSIS — E782 Mixed hyperlipidemia: Secondary | ICD-10-CM

## 2020-04-24 DIAGNOSIS — Z9989 Dependence on other enabling machines and devices: Secondary | ICD-10-CM | POA: Diagnosis not present

## 2020-04-24 DIAGNOSIS — Z0001 Encounter for general adult medical examination with abnormal findings: Secondary | ICD-10-CM

## 2020-04-24 DIAGNOSIS — I10 Essential (primary) hypertension: Secondary | ICD-10-CM

## 2020-04-24 NOTE — Progress Notes (Signed)
[facility] [place], [place] [address]  Internal MEDICINE  Office Visit Note  Patient Name: Ashley Mckay  [id number]  [id number]  Date of Service: 04/24/2020   Pt is here for routine health maintenance examination   Chief Complaint  Patient presents with  . Medicare Wellness  . Hypertension  . Quality Metric Gaps    flu shot     The patient is here for health maintenance exam. Her blood pressure is well managed. She had routine, fasting labs prior to this visit. Labs results are not available at this time. Will contact her with results as soon as they are available.  She should have screening mammogram done later this month. An order has been placed for this today.  She states that she is doing well and has no new concerns or complaints today.    Current Medication: Outpatient Encounter Medications as of 04/24/2020  Medication Sig Note  . Docusate Calcium (STOOL SOFTENER PO) Take by mouth.   Marland Kitchen ibuprofen (ADVIL,MOTRIN) 800 MG tablet 800 mg every 8 (eight) hours as needed.  06/21/2014: Received from: External Pharmacy  . levocetirizine (XYZAL) 5 MG tablet Take 1 tablet (5 mg total) by mouth every evening.   . meloxicam (MOBIC) 7.5 MG tablet Take 1 tablet (7.5 mg total) by mouth daily. (Patient taking differently: Take 7.5 mg by mouth as needed. )   . ondansetron (ZOFRAN) 4 MG tablet Take 1 tablet (4 mg total) by mouth every 8 (eight) hours as needed for nausea or vomiting.   Marland Kitchen oxybutynin (DITROPAN-XL) 10 MG 24 hr tablet Take 1 tablet (10 mg total) by mouth at bedtime.   . propranolol ER (INDERAL LA) 80 MG 24 hr capsule Take 1 capsule (80 mg total) by mouth daily.   . rosuvastatin (CRESTOR) 10 MG tablet Take 1 tablet (10 mg total) by mouth daily.   Marland Kitchen triamcinolone cream (KENALOG) 0.1 % Apply 1 application topically 2 (two) times daily.   Marland Kitchen VITAMIN D, CHOLECALCIFEROL, PO Take by mouth daily.    No facility-administered encounter medications on file as of  04/24/2020.    Surgical History: Past Surgical History:  Procedure Laterality Date  . COLONOSCOPY    . COLONOSCOPY WITH PROPOFOL N/A 01/31/2016   Procedure: COLONOSCOPY WITH PROPOFOL;  Surgeon: Manya Silvas, MD;  Location: [facility] ENDOSCOPY;  Service: Endoscopy;  Laterality: N/A;  . ESOPHAGOGASTRODUODENOSCOPY      Medical History: Past Medical History:  Diagnosis Date  . Arthritis   . Headache   . History of chickenpox   . Hypertension   . Sleep apnea     Family History: Family History  Problem Relation Age of Onset  . Breast cancer Mother 55      Review of Systems  Constitutional: Negative for chills, fatigue and unexpected weight change.  HENT: Negative for congestion, postnasal drip, rhinorrhea, sneezing and sore throat.   Respiratory: Negative for cough, chest tightness, shortness of breath and wheezing.   Cardiovascular: Negative for chest pain and palpitations.  Gastrointestinal: Negative for abdominal pain, constipation, diarrhea, nausea and vomiting.  Endocrine: Negative for cold intolerance, heat intolerance, polydipsia and polyuria.  Genitourinary: Negative for dysuria, hematuria and urgency.  Musculoskeletal: Negative for arthralgias, back pain, joint swelling and neck pain.  Skin: Negative for rash.  Allergic/Immunologic: Positive for environmental allergies.  Neurological: Negative for dizziness, tremors, numbness and headaches.  Hematological: Negative for adenopathy. Does not bruise/bleed easily.  Psychiatric/Behavioral: Negative for behavioral problems (Depression), dysphoric mood, sleep  disturbance and suicidal ideas. The patient is not nervous/anxious.      Today's Vitals   04/24/20 0902  BP: 123/74  Pulse: 78  Resp: 16  Temp: (!) 97.5 F (36.4 C)  SpO2: 96%  Weight: 218 lb (98.9 kg)  Height: 5\' 3"  (1.6 m)   Body mass index is 38.62 kg/m.  Physical Exam Vitals and nursing note reviewed.  Constitutional:      General: She is not in acute  distress.    Appearance: Normal appearance. She is well-developed. She is not diaphoretic.  HENT:     Head: Normocephalic and atraumatic.     Nose: Nose normal.     Mouth/Throat:     Pharynx: No oropharyngeal exudate.  Eyes:     Pupils: Pupils are equal, round, and reactive to light.  Neck:     Thyroid: No thyromegaly.     Vascular: No carotid bruit or JVD.     Trachea: No tracheal deviation.  Cardiovascular:     Rate and Rhythm: Normal rate and regular rhythm.     Pulses: Normal pulses.     Heart sounds: Murmur heard.  No friction rub. No gallop.      Comments: Very soft, systolic murmur auscultated over the right chest.  Pulmonary:     Effort: Pulmonary effort is normal. No respiratory distress.     Breath sounds: Normal breath sounds. No wheezing or rales.  Chest:     Chest wall: No tenderness.  Abdominal:     General: Bowel sounds are normal.     Palpations: Abdomen is soft.     Tenderness: There is no abdominal tenderness.  Musculoskeletal:        General: Normal range of motion.     Cervical back: Normal range of motion and neck supple.  Lymphadenopathy:     Cervical: No cervical adenopathy.  Skin:    General: Skin is warm and dry.  Neurological:     General: No focal deficit present.     Mental Status: She is alert and oriented to person, place, and time.     Cranial Nerves: No cranial nerve deficit.  Psychiatric:        Mood and Affect: Mood normal.        Behavior: Behavior normal.        Thought Content: Thought content normal.        Judgment: Judgment normal.    Depression screen [facility] 2/9 04/24/2020 04/17/2020 06/25/2019 04/22/2019 09/21/2018  Decreased Interest 0 0 0 0 0  Down, Depressed, Hopeless 0 0 0 0 0  PHQ - 2 Score 0 0 0 0 0    Functional Status Survey: Is the patient deaf or have difficulty hearing?: No Does the patient have difficulty seeing, even when wearing glasses/contacts?: No Does the patient have difficulty concentrating, remembering, or  making decisions?: No Does the patient have difficulty walking or climbing stairs?: No Does the patient have difficulty dressing or bathing?: No Does the patient have difficulty doing errands alone such as visiting a doctor's office or shopping?: No  MMSE - [place] Exam 04/24/2020 04/22/2019  Orientation to time 5 5  Orientation to Place 5 5  Registration 3 3  Attention/ Calculation 5 5  Recall 3 3  Language- name 2 objects 2 2  Language- repeat 1 1  Language- follow 3 step command 3 3  Language- read & follow direction 1 1  Write a sentence 1 1  Copy design 1 1  Total score 30 30    Fall Risk  04/24/2020 04/17/2020 06/25/2019 04/22/2019 09/21/2018  Falls in the past year? 0 0 0 0 0  Number falls in past yr: - - - - 0  Injury with Fall? - - - - 0    Assessment/Plan: 1. Encounter for general adult medical examination with abnormal findings Annual health maintenance exam today.  2. Essential hypertension Stable. Continue bp medication as prescribed   3. Mixed hyperlipidemia Adjust rosuvastatin dosing as indicated based on lab results when available.   4. OSA on CPAP Continue regular visits with Dr. Devona Konig for CPAP management.    5. Encounter for screening mammogram for malignant neoplasm of breast - MM DIGITAL SCREENING BILATERAL; Future  6. Dysuria - UA/M w/rflx Culture, Routine   General Counseling: Jerie verbalizes understanding of the findings of todays visit and agrees with plan of treatment. I have discussed any further diagnostic evaluation that may be needed or ordered today. We also reviewed her medications today. she has been encouraged to call the office with any questions or concerns that should arise related to todays visit.    Counseling:  This patient was seen by Leretha Pol FNP Collaboration with Dr Lavera Guise as a part of collaborative care agreement  Orders Placed This Encounter  Procedures  . MM DIGITAL SCREENING BILATERAL  .  UA/M w/rflx Culture, Routine      Total time spent: 32 Minutes  Time spent includes review of chart, medications, test results, and follow up plan with the patient.     Lavera Guise, MD  Internal Medicine

## 2020-04-27 LAB — UA/M W/RFLX CULTURE, ROUTINE

## 2020-05-09 ENCOUNTER — Other Ambulatory Visit: Payer: Self-pay | Admitting: Internal Medicine

## 2020-05-09 DIAGNOSIS — G4733 Obstructive sleep apnea (adult) (pediatric): Secondary | ICD-10-CM | POA: Diagnosis not present

## 2020-05-09 DIAGNOSIS — J4520 Mild intermittent asthma, uncomplicated: Secondary | ICD-10-CM

## 2020-05-16 ENCOUNTER — Ambulatory Visit
Admission: RE | Admit: 2020-05-16 | Discharge: 2020-05-16 | Disposition: A | Discharge status: Home / Self Care | Payer: Medicare HMO | Source: Ambulatory Visit | Attending: Nurse Practitioner | Admitting: Nurse Practitioner

## 2020-05-16 DIAGNOSIS — Z1231 Encounter for screening mammogram for malignant neoplasm of breast: Secondary | ICD-10-CM | POA: Diagnosis not present

## 2020-05-19 ENCOUNTER — Ambulatory Visit: Payer: Medicare HMO

## 2020-05-19 DIAGNOSIS — R011 Cardiac murmur, unspecified: Secondary | ICD-10-CM

## 2020-06-10 DIAGNOSIS — R69 Illness, unspecified: Secondary | ICD-10-CM | POA: Diagnosis not present

## 2020-06-19 ENCOUNTER — Telehealth: Payer: Self-pay

## 2020-06-19 NOTE — Telephone Encounter (Signed)
Confirmed and screened for 06-21-20 ov.

## 2020-06-21 ENCOUNTER — Other Ambulatory Visit: Payer: Self-pay

## 2020-06-21 ENCOUNTER — Ambulatory Visit: Payer: Medicare HMO | Admitting: Internal Medicine

## 2020-06-21 DIAGNOSIS — J4520 Mild intermittent asthma, uncomplicated: Secondary | ICD-10-CM | POA: Diagnosis not present

## 2020-06-21 LAB — PULMONARY FUNCTION TEST

## 2020-06-23 NOTE — Procedures (Signed)
[facility] [place], [address]  DATE OF SERVICE: June 21, 2020  Complete Pulmonary Function Testing Interpretation:  FINDINGS:  Forced vital capacity is normal.  FEV1 is normal.  FEV1 FVC ratio is normal.  Postbronchodilator there is no significant change in the FEV1 clinical improvement may still occur in the absence spirometric improvement.  Total lung capacity is mildly decreased.  Residual volume is decreased.  FRC is decreased.  DLCO is normal  IMPRESSION:  This pulmonary function study shows a normal spirometry and mild reduction in lung volumes consistent with a mild restrictive lung disease.  DLCO is normal.  Allyne Gee, MD [facility] Pulmonary Critical Care Medicine Sleep Medicine

## 2020-06-27 NOTE — Progress Notes (Signed)
PFT's reviewed, will need change in therapy, keep follow up

## 2020-08-01 DIAGNOSIS — G4733 Obstructive sleep apnea (adult) (pediatric): Secondary | ICD-10-CM | POA: Diagnosis not present

## 2020-08-21 ENCOUNTER — Encounter: Payer: Self-pay | Admitting: Hospice and Palliative Medicine

## 2020-08-21 ENCOUNTER — Other Ambulatory Visit: Payer: Self-pay

## 2020-08-21 ENCOUNTER — Ambulatory Visit (INDEPENDENT_AMBULATORY_CARE_PROVIDER_SITE_OTHER): Payer: Medicare HMO | Admitting: Hospice and Palliative Medicine

## 2020-08-21 VITALS — BP 114/78 | HR 73 | Temp 98.0°F | Resp 16 | Ht 63.0 in | Wt 218.8 lb

## 2020-08-21 DIAGNOSIS — I340 Nonrheumatic mitral (valve) insufficiency: Secondary | ICD-10-CM

## 2020-08-21 DIAGNOSIS — E559 Vitamin D deficiency, unspecified: Secondary | ICD-10-CM | POA: Diagnosis not present

## 2020-08-21 DIAGNOSIS — E782 Mixed hyperlipidemia: Secondary | ICD-10-CM

## 2020-08-21 DIAGNOSIS — I10 Essential (primary) hypertension: Secondary | ICD-10-CM

## 2020-08-21 NOTE — Progress Notes (Signed)
[facility] [place], [place] [address]  Internal MEDICINE  Office Visit Note  Patient Name: Ashley Mckay  [id number]  [id number]  Date of Service: 08/21/2020  Chief Complaint  Patient presents with   Follow-up    4 months      HPI Patient is here for routine follow-up Overall, things have been going well She did recently loose a cousin involved in a car accident Booster shot 08/09/2020, has also had her influenza vaccine for this year Had mammogram 05/16/2020--normal Reviewed her echocardiogram--Normal LVEF, diastolic dysfunction, mild/moderate MR, mild TR--wears CPAP nightly, no complaints of shortness or ankle swelling  Reviewed recent labs with her--elevated triglyceride levels, vitamin d minimally low  Current Medication: Outpatient Encounter Medications as of 08/21/2020  Medication Sig Note   Docusate Calcium (STOOL SOFTENER PO) Take by mouth.    ibuprofen (ADVIL,MOTRIN) 800 MG tablet 800 mg every 8 (eight) hours as needed.  06/21/2014: Received from: External Pharmacy   levocetirizine (XYZAL) 5 MG tablet Take 1 tablet (5 mg total) by mouth every evening.    Magnesium 250 MG TABS Take by mouth.    meloxicam (MOBIC) 7.5 MG tablet Take 1 tablet (7.5 mg total) by mouth daily. (Patient taking differently: Take 7.5 mg by mouth as needed.)    ondansetron (ZOFRAN) 4 MG tablet Take 1 tablet (4 mg total) by mouth every 8 (eight) hours as needed for nausea or vomiting.    oxybutynin (DITROPAN-XL) 10 MG 24 hr tablet Take 1 tablet (10 mg total) by mouth at bedtime.    propranolol ER (INDERAL LA) 80 MG 24 hr capsule Take 1 capsule (80 mg total) by mouth daily.    rosuvastatin (CRESTOR) 10 MG tablet Take 1 tablet (10 mg total) by mouth daily.    triamcinolone cream (KENALOG) 0.1 % Apply 1 application topically 2 (two) times daily.    VITAMIN D, CHOLECALCIFEROL, PO Take 1,000 Int'l Units by mouth daily.    No facility-administered encounter  medications on file as of 08/21/2020.    Surgical History: Past Surgical History:  Procedure Laterality Date   COLONOSCOPY     COLONOSCOPY WITH PROPOFOL N/A 01/31/2016   Procedure: COLONOSCOPY WITH PROPOFOL;  Surgeon: Manya Silvas, MD;  Location: [facility] ENDOSCOPY;  Service: Endoscopy;  Laterality: N/A;   ESOPHAGOGASTRODUODENOSCOPY     tooth implant     started in 2020 but finished dec 2021    Medical History: Past Medical History:  Diagnosis Date   Arthritis    Headache    History of chickenpox    Hypertension    Sleep apnea     Family History: Family History  Problem Relation Age of Onset   Breast cancer Mother 36    Social History   Socioeconomic History   Marital status: Married    Spouse name: Not on file   Number of children: Not on file   Years of education: Not on file   Highest education level: Not on file  Occupational History   Not on file  Tobacco Use   Smoking status: Never Smoker   Smokeless tobacco: Never Used  Substance and Sexual Activity   Alcohol use: Yes    Comment: rarely   Drug use: No   Sexual activity: Not on file  Other Topics Concern   Not on file  Social History Narrative   Not on file   Social Determinants of Health   Financial Resource Strain: Not on file  Food Insecurity: Not on  file  Transportation Needs: Not on file  Physical Activity: Not on file  Stress: Not on file  Social Connections: Not on file  Intimate Partner Violence: Not on file     Review of Systems  Constitutional: Negative for chills, diaphoresis and fatigue.  HENT: Negative for ear pain, postnasal drip and sinus pressure.   Eyes: Negative for photophobia, discharge, redness, itching and visual disturbance.  Respiratory: Negative for cough, shortness of breath and wheezing.   Cardiovascular: Negative for chest pain, palpitations and leg swelling.  Gastrointestinal: Negative for abdominal pain, constipation, diarrhea, nausea and  vomiting.  Genitourinary: Negative for dysuria and flank pain.  Musculoskeletal: Negative for arthralgias, back pain, gait problem and neck pain.  Skin: Negative for color change.  Allergic/Immunologic: Negative for environmental allergies and food allergies.  Neurological: Negative for dizziness and headaches.  Hematological: Does not bruise/bleed easily.  Psychiatric/Behavioral: Negative for agitation, behavioral problems (depression) and hallucinations.    Vital Signs: BP 114/78    Pulse 73    Temp 98 F (36.7 C)    Resp 16    Ht 5\' 3"  (1.6 m)    Wt 218 lb 12.8 oz (99.2 kg)    LMP  (LMP Unknown)    SpO2 95%    BMI 38.76 kg/m    Physical Exam Vitals reviewed.  Constitutional:      Appearance: Normal appearance. She is obese.  Cardiovascular:     Rate and Rhythm: Normal rate and regular rhythm.     Pulses: Normal pulses.     Heart sounds: Normal heart sounds.  Pulmonary:     Effort: Pulmonary effort is normal.     Breath sounds: Normal breath sounds.  Abdominal:     General: Abdomen is flat.     Palpations: Abdomen is soft.  Musculoskeletal:        General: Normal range of motion.     Cervical back: Normal range of motion.  Skin:    General: Skin is warm.  Neurological:     General: No focal deficit present.     Mental Status: She is alert and oriented to person, place, and time. Mental status is at baseline.  Psychiatric:        Mood and Affect: Mood normal.        Behavior: Behavior normal.        Thought Content: Thought content normal.        Judgment: Judgment normal.    Assessment/Plan: 1. Mixed hyperlipidemia Continue with rosuvastatin at this time and continue with monitoring//consider further vascular studies  2. Morbid obesity (HCC) BMI 38.7--with further risk factors including hyperlipidemia, diastolic dysfunction and valve disease Discussed the importance of weight management through healthy eating and daily exercise as tolerated. Discussed the negative  effects obesity has on pulmonary health, cardiac health as well as overall general health and well being.   3. Essential hypertension BP and HR well controlled today on current therapy, continue with routine monitoring  4. Vitamin D deficiency Continue with supplements and will continue to monitor  5. Moderate mitral valve regurgitation Closely monitor for progression of valve disease and symptoms  General Counseling: Milanie verbalizes understanding of the findings of todays visit and agrees with plan of treatment. I have discussed any further diagnostic evaluation that may be needed or ordered today. We also reviewed her medications today. she has been encouraged to call the office with any questions or concerns that should arise related to todays visit.  Time spent: 30  Minutes Time spent includes review of chart, medications, test results and follow-up plan with the patient.  This patient was seen by Theodoro Grist AGNP-C in Collaboration with Dr Lavera Guise as a part of collaborative care agreement     Tanna Furry. Edge Mauger AGNP-C Internal medicine

## 2020-08-23 ENCOUNTER — Encounter: Payer: Self-pay | Admitting: Hospice and Palliative Medicine

## 2020-09-05 ENCOUNTER — Telehealth: Payer: Self-pay

## 2020-09-05 NOTE — Telephone Encounter (Signed)
Patient called stating they took an at home covid test and it tested positive. Patient is currently no having any symptoms other than sore throat. I did make patient aware that the at home Covid testing kits were not giving accurate results and to try going to a covid testing site that did not do the rapid test to get the most accurate result. Advised them to try [facility] walk in, [facility] or to contact the local health department to see what their availability is. Advised them to contact our office if she started experiencing symptoms to see if we couldn't schedule her for a virtual appointment.

## 2020-09-07 ENCOUNTER — Other Ambulatory Visit: Payer: Self-pay | Admitting: Nurse Practitioner

## 2020-09-07 ENCOUNTER — Telehealth: Payer: Self-pay

## 2020-09-07 DIAGNOSIS — J069 Acute upper respiratory infection, unspecified: Secondary | ICD-10-CM

## 2020-09-07 DIAGNOSIS — U071 COVID-19: Secondary | ICD-10-CM

## 2020-09-07 MED ORDER — AZITHROMYCIN 250 MG PO TABS: ORAL_TABLET | ORAL | 0 refills | Status: DC

## 2020-09-07 MED ORDER — METHYLPREDNISOLONE 4 MG PO TBPK: ORAL_TABLET | ORAL | 0 refills | Status: DC

## 2020-09-07 NOTE — Telephone Encounter (Signed)
Sent z-pack and medrol dose pack. Take as directed for 6 days. She should rest and increase fluids. She should take OTC zinc, vitamin d, and Vitamin C everyday. She can take OTC medications as needed and as indicated  to help acute symptoms.

## 2020-10-13 ENCOUNTER — Other Ambulatory Visit: Payer: Self-pay

## 2020-10-13 DIAGNOSIS — R32 Unspecified urinary incontinence: Secondary | ICD-10-CM

## 2020-10-13 MED ORDER — OXYBUTYNIN CHLORIDE ER 10 MG PO TB24: 10.0000 mg | ORAL_TABLET | Freq: Every day | ORAL | 1 refills | Status: DC

## 2020-10-16 ENCOUNTER — Encounter: Payer: Self-pay | Admitting: Internal Medicine

## 2020-10-16 ENCOUNTER — Other Ambulatory Visit: Payer: Self-pay

## 2020-10-16 ENCOUNTER — Ambulatory Visit: Payer: Medicare HMO | Admitting: Internal Medicine

## 2020-10-16 VITALS — BP 122/80 | HR 83 | Temp 98.1°F | Resp 16 | Ht 62.0 in | Wt 220.8 lb

## 2020-10-16 DIAGNOSIS — Z7189 Other specified counseling: Secondary | ICD-10-CM | POA: Diagnosis not present

## 2020-10-16 DIAGNOSIS — G4733 Obstructive sleep apnea (adult) (pediatric): Secondary | ICD-10-CM

## 2020-10-16 DIAGNOSIS — U071 COVID-19: Secondary | ICD-10-CM | POA: Diagnosis not present

## 2020-10-16 NOTE — Progress Notes (Signed)
[facility] [place], [place] [address]  Pulmonary Sleep Medicine   Office Visit Note  Patient Name: Ashley Mckay DOB: 1950-10-05 MRN [id number]  Date of Service: 10/16/2020  Complaints/HPI: PFT done in October looks good. She had covid in December 2021 overall she states that she is doing okay has some some shortness of breath with exertion.  There is also some cough noted.  The patient is using CPAP device for her sleep apnea.  Asthma has been under decent control and she continues to use her inhalers medications as prescribed.  ROS  General: (-) fever, (-) chills, (-) night sweats, (-) weakness Skin: (-) rashes, (-) itching,. Eyes: (-) visual changes, (-) redness, (-) itching. Nose and Sinuses: (-) nasal stuffiness or itchiness, (-) postnasal drip, (-) nosebleeds, (-) sinus trouble. Mouth and Throat: (-) sore throat, (-) hoarseness. Neck: (-) swollen glands, (-) enlarged thyroid, (-) neck pain. Respiratory: - cough, (-) bloody sputum, + shortness of breath, - wheezing. Cardiovascular: - ankle swelling, (-) chest pain. Lymphatic: (-) lymph node enlargement. Neurologic: (-) numbness, (-) tingling. Psychiatric: (-) anxiety, (-) depression   Current Medication: Outpatient Encounter Medications as of 10/16/2020  Medication Sig Note  . Docusate Calcium (STOOL SOFTENER PO) Take by mouth.   Marland Kitchen ibuprofen (ADVIL,MOTRIN) 800 MG tablet 800 mg every 8 (eight) hours as needed.  06/21/2014: Received from: External Pharmacy  . levocetirizine (XYZAL) 5 MG tablet Take 1 tablet (5 mg total) by mouth every evening.   . Magnesium 250 MG TABS Take by mouth.   . meloxicam (MOBIC) 7.5 MG tablet Take 1 tablet (7.5 mg total) by mouth daily. (Patient taking differently: Take 7.5 mg by mouth as needed.)   . ondansetron (ZOFRAN) 4 MG tablet Take 1 tablet (4 mg total) by mouth every 8 (eight) hours as needed for nausea or vomiting.   Marland Kitchen oxybutynin (DITROPAN-XL) 10 MG 24 hr tablet Take  1 tablet (10 mg total) by mouth at bedtime.   . propranolol ER (INDERAL LA) 80 MG 24 hr capsule Take 1 capsule (80 mg total) by mouth daily.   . rosuvastatin (CRESTOR) 10 MG tablet Take 1 tablet (10 mg total) by mouth daily.   Marland Kitchen triamcinolone cream (KENALOG) 0.1 % Apply 1 application topically 2 (two) times daily.   Marland Kitchen VITAMIN D, CHOLECALCIFEROL, PO Take 1,000 Int'l Units by mouth daily.   . [DISCONTINUED] azithromycin (ZITHROMAX) 250 MG tablet z-pack - take as directed for 5 days (Patient not taking: Reported on 10/16/2020)   . [DISCONTINUED] methylPREDNISolone (MEDROL) 4 MG TBPK tablet Take by mouth as directed for 6 days (Patient not taking: Reported on 10/16/2020)    No facility-administered encounter medications on file as of 10/16/2020.    Surgical History: Past Surgical History:  Procedure Laterality Date  . COLONOSCOPY    . COLONOSCOPY WITH PROPOFOL N/A 01/31/2016   Procedure: COLONOSCOPY WITH PROPOFOL;  Surgeon: Manya Silvas, MD;  Location: [facility] END[facility]COPY;  Service: Endoscopy;  Laterality: N/A;  . ESOPHAGOGASTRODUODENOSCOPY    . tooth implant     started in 2020 but finished dec 2021    Medical History: Past Medical History:  Diagnosis Date  . Arthritis   . Headache   . History of chickenpox   . Hypertension   . Sleep apnea     Family History: Family History  Problem Relation Age of Onset  . Breast cancer Mother 5    Social History: Social History   Socioeconomic History  . Marital  status: Married    Spouse name: Not on file  . Number of children: Not on file  . Years of education: Not on file  . Highest education level: Not on file  Occupational History  . Not on file  Tobacco Use  . Smoking status: Never Smoker  . Smokeless tobacco: Never Used  Substance and Sexual Activity  . Alcohol use: Yes    Comment: rarely  . Drug use: No  . Sexual activity: Not on file  Other Topics Concern  . Not on file  Social History Narrative  . Not on file   Social  Determinants of Health   Financial Resource Strain: Not on file  Food Insecurity: Not on file  Transportation Needs: Not on file  Physical Activity: Not on file  Stress: Not on file  Social Connections: Not on file  Intimate Partner Violence: Not on file    Vital Signs: Blood pressure 122/80, pulse 83, temperature 98.1 F (36.7 C), resp. rate 16, height 5\' 2"  (1.575 m), weight 220 lb 12.8 oz (100.2 kg), SpO2 93 %.  Examination: General Appearance: The patient is well-developed, well-nourished, and in no distress. Skin: Gross inspection of skin unremarkable. Head: normocephalic, no gross deformities. Eyes: no gross deformities noted. ENT: ears appear grossly normal no exudates. Neck: Supple. No thyromegaly. No LAD. Respiratory: no rhonchi noted at this time. Cardiovascular: Normal S1 and S2 without murmur or rub. Extremities: No cyanosis. pulses are equal. Neurologic: Alert and oriented. No involuntary movements.  LABS: No results found for this or any previous visit (from the past 2160 hour(s)).  Radiology: MM 3D SCREEN BREAST BILATERAL  Result Date: 05/17/2020 CLINICAL DATA:  Screening. EXAM: DIGITAL SCREENING BILATERAL MAMMOGRAM WITH TOMO AND CAD COMPARISON:  Previous exam(s). ACR Breast Density Category c: The breast tissue is heterogeneously dense, which may obscure small masses. FINDINGS: There are no findings suspicious for malignancy. Images were processed with CAD. IMPRESSION: No mammographic evidence of malignancy. A result letter of this screening mammogram will be mailed directly to the patient. RECOMMENDATION: Screening mammogram in one year. (Code:SM-B-01Y) BI-RADS CATEGORY  1: Negative. Electronically Signed   By: Kristopher Oppenheim M.D.   On: 05/17/2020 10:20    No results found.  No results found.    Assessment and Plan: Patient Active Problem List   Diagnosis Date Noted  . Encounter for screening mammogram for malignant neoplasm of breast 04/24/2020  . OSA on  CPAP 01/07/2020  . Urinary incontinence 04/25/2019  . Need for vaccination against Streptococcus pneumoniae using pneumococcal conjugate vaccine 13 06/30/2018  . Rash 04/14/2018  . Vaginal candidiasis 04/14/2018  . Stomatitis and mucositis 04/14/2018  . Other atopic dermatitis 04/14/2018  . Nausea 04/14/2018  . Encounter for general adult medical examination with abnormal findings 12/29/2017  . Essential hypertension 12/29/2017  . Mixed hyperlipidemia 12/29/2017  . Dysuria 12/29/2017  . Non-seasonal allergic rhinitis due to pollen 12/29/2017  . Unspecified menopausal and perimenopausal disorder 12/29/2017  . Urinary tract infection with hematuria 12/29/2017  . Neoplasm of uncertain behavior of skin of lower extremity 12/29/2017  . Plantar fasciitis 09/11/2015  . Paronychia 08/25/2014    1. OSA (obstructive sleep apnea) On CPAP therapy plan is going to be to continue with the CPAP as prescribed   2. Obesity, morbid ([place]) Obesity Counseling: Had a lengthy discussion regarding patients BMI and weight issues. Patient was instructed on portion control as well as increased activity. Also discussed caloric restrictions with trying to maintain intake less than 2000  Kcal. Discussions were made in accordance with the 5As of weight management. Simple actions such as not eating late and if able to, taking a walk is suggested.   3. CPAP use counseling CPAP Counseling: had a lengthy discussion with the patient regarding the importance of PAP therapy in management of the sleep apnea. Patient appears to understand the risk factor reduction and also understands the risks associated with untreated sleep apnea. Patient will try to make a good faith effort to remain compliant with therapy. Also instructed the patient on proper cleaning of the device including the water must be changed daily if possible and use of distilled water is preferred. Patient understands that the machine should be regularly cleaned  with appropriate recommended cleaning solutions that do not damage the PAP machine for example given white vinegar and water rinses. Other methods such as ozone treatment may not be as good as these simple methods to achieve cleaning.  4. COVID-19 Recovered we will continue to monitor  5.  Chronic obstructive asthma controlled we will continue with supportive care inhalers as needed    General Counseling: I have discussed the findings of the evaluation and examination with Johany.  I have also discussed any further diagnostic evaluation thatmay be needed or ordered today. Milinda verbalizes understanding of the findings of todays visit. We also reviewed her medications today and discussed drug interactions and side effects including but not limited excessive drowsiness and altered mental states. We also discussed that there is always a risk not just to her but also people around her. she has been encouraged to call the office with any questions or concerns that should arise related to todays visit.  No orders of the defined types were placed in this encounter.    Time spent: 73  I have personally obtained a history, examined the patient, evaluated laboratory and imaging results, formulated the assessment and plan and placed orders.    Allyne Gee, MD [facility] Pulmonary and Critical Care Sleep medicine

## 2020-10-16 NOTE — Patient Instructions (Signed)
CPAP and BPAP Information CPAP and BPAP are methods that use air pressure to keep your airways open and to help you breathe well. CPAP and BPAP use different amounts of pressure. Your health care provider will tell you whether CPAP or BPAP would be more helpful for you.  CPAP stands for "continuous positive airway pressure." With CPAP, the amount of pressure stays the same while you breathe in and out.  BPAP stands for "bi-level positive airway pressure." With BPAP, the amount of pressure will be higher when you breathe in (inhale) and lower when you breathe out(exhale). This allows you to take larger breaths. CPAP or BPAP may be used in the hospital, or your health care provider may want you to use it at home. You may need to have a sleep study before your health care provider can order a machine for you to use at home. Why are CPAP and BPAP treatments used? CPAP or BPAP can be helpful if you have:  Sleep apnea.  Chronic obstructive pulmonary disease (COPD).  Heart failure.  Medical conditions that cause muscle weakness, including muscular dystrophy or amyotrophic lateral sclerosis (ALS).  Other problems that cause breathing to be shallow, weak, abnormal, or difficult. CPAP and BPAP are most commonly used for obstructive sleep apnea (OSA) to keep the airways from collapsing when the muscles relax during sleep. How is CPAP or BPAP administered? Both CPAP and BPAP are provided by a small machine with a flexible plastic tube that attaches to a plastic mask that you wear. Air is blown through the mask into your nose or mouth. The amount of pressure that is used to blow the air can be adjusted on the machine. Your health care provider will set the pressure setting and help you find the best mask for you. When should CPAP or BPAP be used? In most cases, the mask only needs to be worn during sleep. Generally, the mask needs to be worn throughout the night and during any daytime naps. People with  certain medical conditions may also need to wear the mask at other times when they are awake. Follow instructions from your health care provider about when to use the machine. What are some tips for using the mask?  Because the mask needs to be snug, some people feel trapped or closed-in (claustrophobic) when first using the mask. If you feel this way, you may need to get used to the mask. One way to do this is to hold the mask loosely over your nose or mouth and then gradually apply the mask more snugly. You can also gradually increase the amount of time that you use the mask.  Masks are available in various types and sizes. If your mask does not fit well, talk with your health care provider about getting a different one. Some common types of masks include: ? Full face masks, which fit over the mouth and nose. ? Nasal masks, which fit over the nose. ? Nasal pillow or prong masks, which fit into the nostrils.  If you are using a mask that fits over your nose and you tend to breathe through your mouth, a chin strap may be applied to help keep your mouth closed.  Some CPAP and BPAP machines have alarms that may sound if the mask comes off or develops a leak.  If you have trouble with the mask, it is very important that you talk with your health care provider about finding a way to make the mask easier to   tolerate. Do not stop using the mask. There could be a negative impact to your health if you stop using the mask.   What are some tips for using the machine?  Place your CPAP or BPAP machine on a secure table or stand near an electrical outlet.  Know where the on/off switch is on the machine.  Follow instructions from your health care provider about how to set the pressure on your machine and when you should use it.  Do not eat or drink while the CPAP or BPAP machine is on. Food or fluids could get pushed into your lungs by the pressure of the CPAP or BPAP.  For home use, CPAP and BPAP  machines can be rented or purchased through home health care companies. Many different brands of machines are available. Renting a machine before purchasing may help you find out which particular machine works well for you. Your insurance may also decide which machine you may get.  Keep the CPAP or BPAP machine and attachments clean. Ask your health care provider for specific instructions. Follow these instructions at home:  Do not use any products that contain nicotine or tobacco, such as cigarettes, e-cigarettes, and chewing tobacco. If you need help quitting, ask your health care provider.  Keep all follow-up visits as told by your health care provider. This is important. Contact a health care provider if:  You have redness or pressure sores on your head, face, mouth, or nose from the mask or head gear.  You have trouble using the CPAP or BPAP machine.  You cannot tolerate wearing the CPAP or BPAP mask.  Someone tells you that you snore even when wearing your CPAP or BPAP. Get help right away if:  You have trouble breathing.  You feel confused. Summary  CPAP and BPAP are methods that use air pressure to keep your airways open and to help you breathe well.  You may need to have a sleep study before your health care provider can order a machine for home use.  If you have trouble with the mask, it is very important that you talk with your health care provider about finding a way to make the mask easier to tolerate. Do not stop using the mask. There could be a negative impact to your health if you stop using the mask.  Follow instructions from your health care provider about when to use the machine. This information is not intended to replace advice given to you by your health care provider. Make sure you discuss any questions you have with your health care provider. Document Revised: 09/17/2019 Document Reviewed: 09/20/2019 Elsevier Patient Education  2021 [place].   Sleep  Apnea Sleep apnea affects breathing during sleep. It causes breathing to stop for a short time or to become shallow. It can also increase the risk of:  Heart attack.  Stroke.  Being very overweight (obese).  Diabetes.  Heart failure.  Irregular heartbeat. The goal of treatment is to help you breathe normally again. What are the causes? There are three kinds of sleep apnea:  Obstructive sleep apnea. This is caused by a blocked or collapsed airway.  Central sleep apnea. This happens when the brain does not send the right signals to the muscles that control breathing.  Mixed sleep apnea. This is a combination of obstructive and central sleep apnea. The most common cause of this condition is a collapsed or blocked airway. This can happen if:  Your throat muscles are too relaxed.  Your tongue and tonsils are too large.  You are overweight.  Your airway is too small.   What increases the risk?  Being overweight.  Smoking.  Having a small airway.  Being older.  Being female.  Drinking alcohol.  Taking medicines to calm yourself (sedatives or tranquilizers).  Having family members with the condition. What are the signs or symptoms?  Trouble staying asleep.  Being sleepy or tired during the day.  Getting angry a lot.  Loud snoring.  Headaches in the morning.  Not being able to focus your mind (concentrate).  Forgetting things.  Less interest in sex.  Mood swings.  Personality changes.  Feelings of sadness (depression).  Waking up a lot during the night to pee (urinate).  Dry mouth.  Sore throat. How is this diagnosed?  Your medical history.  A physical exam.  A test that is done when you are sleeping (sleep study). The test is most often done in a sleep lab but may also be done at home. How is this treated?  Sleeping on your side.  Using a medicine to get rid of mucus in your nose (decongestant).  Avoiding the use of alcohol, medicines  to help you relax, or certain pain medicines (narcotics).  Losing weight, if needed.  Changing your diet.  Not smoking.  Using a machine to open your airway while you sleep, such as: ? An oral appliance. This is a mouthpiece that shifts your lower jaw forward. ? A CPAP device. This device blows air through a mask when you breathe out (exhale). ? An EPAP device. This has valves that you put in each nostril. ? A BPAP device. This device blows air through a mask when you breathe in (inhale) and breathe out.  Having surgery if other treatments do not work. It is important to get treatment for sleep apnea. Without treatment, it can lead to:  High blood pressure.  Coronary artery disease.  In men, not being able to have an erection (impotence).  Reduced thinking ability.   Follow these instructions at home: Lifestyle  Make changes that your doctor recommends.  Eat a healthy diet.  Lose weight if needed.  Avoid alcohol, medicines to help you relax, and some pain medicines.  Do not use any products that contain nicotine or tobacco, such as cigarettes, e-cigarettes, and chewing tobacco. If you need help quitting, ask your doctor. General instructions  Take over-the-counter and prescription medicines only as told by your doctor.  If you were given a machine to use while you sleep, use it only as told by your doctor.  If you are having surgery, make sure to tell your doctor you have sleep apnea. You may need to bring your device with you.  Keep all follow-up visits as told by your doctor. This is important. Contact a doctor if:  The machine that you were given to use during sleep bothers you or does not seem to be working.  You do not get better.  You get worse. Get help right away if:  Your chest hurts.  You have trouble breathing in enough air.  You have an uncomfortable feeling in your back, arms, or stomach.  You have trouble talking.  One side of your body  feels weak.  A part of your face is hanging down. These symptoms may be an emergency. Do not wait to see if the symptoms will go away. Get medical help right away. Call your local emergency services (911 in the [place]  Do not drive yourself to the hospital. Summary  This condition affects breathing during sleep.  The most common cause is a collapsed or blocked airway.  The goal of treatment is to help you breathe normally while you sleep. This information is not intended to replace advice given to you by your health care provider. Make sure you discuss any questions you have with your health care provider. Document Revised: 06/12/2018 Document Reviewed: 04/21/2018 Elsevier Patient Education  [place].

## 2020-10-18 ENCOUNTER — Encounter: Payer: Self-pay | Admitting: Hospice and Palliative Medicine

## 2020-10-18 ENCOUNTER — Ambulatory Visit (INDEPENDENT_AMBULATORY_CARE_PROVIDER_SITE_OTHER): Payer: Medicare HMO | Admitting: Hospice and Palliative Medicine

## 2020-10-18 ENCOUNTER — Other Ambulatory Visit: Payer: Self-pay

## 2020-10-18 VITALS — BP 131/73 | HR 82 | Temp 97.3°F | Resp 16 | Ht 62.0 in | Wt 219.4 lb

## 2020-10-18 DIAGNOSIS — I340 Nonrheumatic mitral (valve) insufficiency: Secondary | ICD-10-CM

## 2020-10-18 DIAGNOSIS — R0989 Other specified symptoms and signs involving the circulatory and respiratory systems: Secondary | ICD-10-CM | POA: Diagnosis not present

## 2020-10-18 DIAGNOSIS — E782 Mixed hyperlipidemia: Secondary | ICD-10-CM

## 2020-10-18 NOTE — Progress Notes (Signed)
[facility] [place], [place] [address]  Internal MEDICINE   Office Visit Note  Patient Name: Ashley Mckay  [id number]  [id number]  Date of Service: 10/23/2020  Chief Complaint  Patient presents with  . Follow-up  . Hypertension    HPI Patient is here for routine follow-up Had COVID end of December--recovered well, denies any lingering symptoms Continues to wear CPAP nightly without complications, breathing remains well controlled--followed by pulmonary Denies ankle edema--mitral regurgitation noted on recent echocardiogram  Mammogram normal in September 2021 Colonoscopy completed 2017--normal, recommended repeat in 10 years  Current Medication: Outpatient Encounter Medications as of 10/18/2020  Medication Sig Note  . Docusate Calcium (STOOL SOFTENER PO) Take by mouth.   Marland Kitchen ibuprofen (ADVIL,MOTRIN) 800 MG tablet 800 mg every 8 (eight) hours as needed.  06/21/2014: Received from: External Pharmacy  . levocetirizine (XYZAL) 5 MG tablet Take 1 tablet (5 mg total) by mouth every evening.   . Magnesium 250 MG TABS Take by mouth.   . meloxicam (MOBIC) 7.5 MG tablet Take 1 tablet (7.5 mg total) by mouth daily. (Patient taking differently: Take 7.5 mg by mouth as needed.)   . ondansetron (ZOFRAN) 4 MG tablet Take 1 tablet (4 mg total) by mouth every 8 (eight) hours as needed for nausea or vomiting.   Marland Kitchen oxybutynin (DITROPAN-XL) 10 MG 24 hr tablet Take 1 tablet (10 mg total) by mouth at bedtime.   . propranolol ER (INDERAL LA) 80 MG 24 hr capsule Take 1 capsule (80 mg total) by mouth daily.   . rosuvastatin (CRESTOR) 10 MG tablet Take 1 tablet (10 mg total) by mouth daily.   Marland Kitchen triamcinolone cream (KENALOG) 0.1 % Apply 1 application topically 2 (two) times daily.   Marland Kitchen VITAMIN D, CHOLECALCIFEROL, PO Take 1,000 Int'l Units by mouth daily.    No facility-administered encounter medications on file as of 10/18/2020.    Surgical History: Past Surgical History:  Procedure  Laterality Date  . COLONOSCOPY    . COLONOSCOPY WITH PROPOFOL N/A 01/31/2016   Procedure: COLONOSCOPY WITH PROPOFOL;  Surgeon: Manya Silvas, MD;  Location: [facility] ENDOSCOPY;  Service: Endoscopy;  Laterality: N/A;  . ESOPHAGOGASTRODUODENOSCOPY    . tooth implant     started in 2020 but finished dec 2021    Medical History: Past Medical History:  Diagnosis Date  . Arthritis   . Headache   . History of chickenpox   . Hypertension   . Sleep apnea     Family History: Family History  Problem Relation Age of Onset  . Breast cancer Mother 76    Social History   Socioeconomic History  . Marital status: Married    Spouse name: Not on file  . Number of children: Not on file  . Years of education: Not on file  . Highest education level: Not on file  Occupational History  . Not on file  Tobacco Use  . Smoking status: Never Smoker  . Smokeless tobacco: Never Used  Substance and Sexual Activity  . Alcohol use: Yes    Comment: rarely  . Drug use: No  . Sexual activity: Not on file  Other Topics Concern  . Not on file  Social History Narrative  . Not on file   Social Determinants of Health   Financial Resource Strain: Not on file  Food Insecurity: Not on file  Transportation Needs: Not on file  Physical Activity: Not on file  Stress: Not on file  Social Connections:  Not on file  Intimate Partner Violence: Not on file    Review of Systems  Constitutional: Negative for chills, diaphoresis and fatigue.  HENT: Negative for ear pain, postnasal drip and sinus pressure.   Eyes: Negative for photophobia, discharge, redness, itching and visual disturbance.  Respiratory: Negative for cough, shortness of breath and wheezing.   Cardiovascular: Negative for chest pain, palpitations and leg swelling.  Gastrointestinal: Negative for abdominal pain, constipation, diarrhea, nausea and vomiting.  Genitourinary: Negative for dysuria and flank pain.  Musculoskeletal: Negative for  arthralgias, back pain, gait problem and neck pain.  Skin: Negative for color change.  Allergic/Immunologic: Negative for environmental allergies and food allergies.  Neurological: Negative for dizziness and headaches.  Hematological: Does not bruise/bleed easily.  Psychiatric/Behavioral: Negative for agitation, behavioral problems (depression) and hallucinations.    Vital Signs: BP 131/73   Pulse 82   Temp (!) 97.3 F (36.3 C)   Resp 16   Ht 5\' 2"  (1.575 m)   Wt 219 lb 6.4 oz (99.5 kg)   LMP  (LMP Unknown)   SpO2 95%   BMI 40.13 kg/m    Physical Exam Vitals reviewed.  Constitutional:      Appearance: Normal appearance. She is obese.  Cardiovascular:     Rate and Rhythm: Normal rate and regular rhythm.     Pulses: Normal pulses.     Heart sounds: Normal heart sounds.  Pulmonary:     Effort: Pulmonary effort is normal.     Breath sounds: Normal breath sounds.  Abdominal:     General: Abdomen is flat.     Palpations: Abdomen is soft.  Musculoskeletal:        General: Normal range of motion.     Cervical back: Normal range of motion.  Skin:    General: Skin is warm.  Neurological:     General: No focal deficit present.     Mental Status: She is alert and oriented to person, place, and time.  Psychiatric:        Mood and Affect: Mood normal.        Behavior: Behavior normal.        Thought Content: Thought content normal.        Judgment: Judgment normal.    Assessment/Plan: 1. Bilateral carotid bruits Review carotid US, continue statin therapy--consider ASA/Plavix - US Carotid Duplex Bilateral; Future  2. Mixed hyperlipidemia Continue with rosuvastatin therpay  3. Moderate mitral valve regurgitation Remains asymptomatic Tight BP control as well as nightly compliance with CPAP  4. Morbid obesity (HCC) BMI 40 Risk factors include-MR, OSA as well as hyperlipidemia Obesity Counseling: Risk Assessment: An assessment of behavioral risk factors was made today  and includes lack of exercise sedentary lifestyle, lack of portion control and poor dietary habits.  Risk Modification Advice: She was counseled on portion control guidelines. Restricting daily caloric intake to 1800. The detrimental long term effects of obesity on her health and ongoing poor compliance was also discussed with the patient.  General Counseling: Luann verbalizes understanding of the findings of todays visit and agrees with plan of treatment. I have discussed any further diagnostic evaluation that may be needed or ordered today. We also reviewed her medications today. she has been encouraged to call the office with any questions or concerns that should arise related to todays visit.    Orders Placed This Encounter  Procedures  . US Carotid Duplex Bilateral      Time spent: 30 Minutes Time spent includes review  of chart, medications, test results and follow-up plan with the patient.  This patient was seen by Theodoro Grist AGNP-C in Collaboration with Dr Lavera Guise as a part of collaborative care agreement     Tanna Furry. Erla Bacchi AGNP-C Internal medicine

## 2020-10-23 ENCOUNTER — Encounter: Payer: Self-pay | Admitting: Hospice and Palliative Medicine

## 2020-10-24 ENCOUNTER — Telehealth: Payer: Self-pay

## 2020-10-24 NOTE — Telephone Encounter (Signed)
Mailed patient records to [facility] on 10/24/2020 at [address] [place] [address] 

## 2020-11-01 DIAGNOSIS — G4733 Obstructive sleep apnea (adult) (pediatric): Secondary | ICD-10-CM | POA: Diagnosis not present

## 2020-11-20 ENCOUNTER — Telehealth: Payer: Self-pay

## 2020-11-20 NOTE — Telephone Encounter (Signed)
Confirmed and screened for 11-22-20 ov. Patient advised of copay. Ashley Mckay

## 2020-11-22 ENCOUNTER — Other Ambulatory Visit: Payer: Medicare HMO

## 2020-12-13 ENCOUNTER — Other Ambulatory Visit: Payer: Medicare HMO

## 2021-01-03 ENCOUNTER — Ambulatory Visit: Payer: Medicare HMO

## 2021-01-03 ENCOUNTER — Other Ambulatory Visit: Payer: Self-pay

## 2021-01-03 DIAGNOSIS — R0989 Other specified symptoms and signs involving the circulatory and respiratory systems: Secondary | ICD-10-CM | POA: Diagnosis not present

## 2021-01-17 ENCOUNTER — Ambulatory Visit: Payer: Medicare HMO | Admitting: Physician Assistant

## 2021-01-18 ENCOUNTER — Encounter: Payer: Self-pay | Admitting: Physician Assistant

## 2021-01-18 ENCOUNTER — Other Ambulatory Visit: Payer: Self-pay

## 2021-01-18 ENCOUNTER — Ambulatory Visit (INDEPENDENT_AMBULATORY_CARE_PROVIDER_SITE_OTHER): Payer: Medicare HMO | Admitting: Physician Assistant

## 2021-01-18 DIAGNOSIS — I340 Nonrheumatic mitral (valve) insufficiency: Secondary | ICD-10-CM | POA: Diagnosis not present

## 2021-01-18 DIAGNOSIS — E559 Vitamin D deficiency, unspecified: Secondary | ICD-10-CM

## 2021-01-18 DIAGNOSIS — E669 Obesity, unspecified: Secondary | ICD-10-CM | POA: Diagnosis not present

## 2021-01-18 DIAGNOSIS — E782 Mixed hyperlipidemia: Secondary | ICD-10-CM | POA: Diagnosis not present

## 2021-01-18 DIAGNOSIS — R5383 Other fatigue: Secondary | ICD-10-CM | POA: Diagnosis not present

## 2021-01-18 DIAGNOSIS — R0989 Other specified symptoms and signs involving the circulatory and respiratory systems: Secondary | ICD-10-CM

## 2021-01-18 DIAGNOSIS — I10 Essential (primary) hypertension: Secondary | ICD-10-CM | POA: Diagnosis not present

## 2021-01-18 NOTE — Progress Notes (Signed)
[facility] [place], [place] [address]  Internal MEDICINE  Office Visit Note  Patient Name: Ashley Mckay  [id number]  [id number]  Date of Service: 01/19/2021  Chief Complaint  Patient presents with  . Follow-up    results  . Results    ultrasound    HPI Pt is here for [place] results. She is doing well today and has no complaints -She is very happy to learn that carotid US results were good. She will continue daily statin. -She is doing well, helping husband with remembering things following recent health problems -Discussed working on weight loss goals, including improving diet and exercising more. She does have knee pain that can limit her activity and may consider knee replacement. Discussed going for walks with husband and doing water aerobic activity as able to help reduce strain on knees -She has her physical upcoming and will have labs done prior   Current Medication: Outpatient Encounter Medications as of 01/18/2021  Medication Sig Note  . ibuprofen (ADVIL,MOTRIN) 800 MG tablet 800 mg every 8 (eight) hours as needed.  06/21/2014: Received from: External Pharmacy  . levocetirizine (XYZAL) 5 MG tablet Take 1 tablet (5 mg total) by mouth every evening.   . Magnesium 250 MG TABS Take by mouth.   . ondansetron (ZOFRAN) 4 MG tablet Take 1 tablet (4 mg total) by mouth every 8 (eight) hours as needed for nausea or vomiting.   Marland Kitchen oxybutynin (DITROPAN-XL) 10 MG 24 hr tablet Take 1 tablet (10 mg total) by mouth at bedtime.   . propranolol ER (INDERAL LA) 80 MG 24 hr capsule Take 1 capsule (80 mg total) by mouth daily.   . rosuvastatin (CRESTOR) 10 MG tablet Take 1 tablet (10 mg total) by mouth daily.   Marland Kitchen triamcinolone cream (KENALOG) 0.1 % Apply 1 application topically 2 (two) times daily.   Marland Kitchen VITAMIN D, CHOLECALCIFEROL, PO Take 1,000 Int'l Units by mouth daily.   Mariane Baumgarten Calcium (STOOL SOFTENER PO) Take by mouth. (Patient not taking: Reported on 01/18/2021)    . meloxicam (MOBIC) 7.5 MG tablet Take 1 tablet (7.5 mg total) by mouth daily. (Patient not taking: Reported on 01/18/2021)    No facility-administered encounter medications on file as of 01/18/2021.    Surgical History: Past Surgical History:  Procedure Laterality Date  . COLONOSCOPY    . COLONOSCOPY WITH PROPOFOL N/A 01/31/2016   Procedure: COLONOSCOPY WITH PROPOFOL;  Surgeon: Manya Silvas, MD;  Location: [facility] ENDOSCOPY;  Service: Endoscopy;  Laterality: N/A;  . ESOPHAGOGASTRODUODENOSCOPY    . tooth implant     started in 2020 but finished dec 2021    Medical History: Past Medical History:  Diagnosis Date  . Arthritis   . Headache   . History of chickenpox   . Hypertension   . Sleep apnea     Family History: Family History  Problem Relation Age of Onset  . Breast cancer Mother 34    Social History   Socioeconomic History  . Marital status: Married    Spouse name: Not on file  . Number of children: Not on file  . Years of education: Not on file  . Highest education level: Not on file  Occupational History  . Not on file  Tobacco Use  . Smoking status: Never Smoker  . Smokeless tobacco: Never Used  Substance and Sexual Activity  . Alcohol use: Yes    Comment: rarely  . Drug use: No  . Sexual activity:  Not on file  Other Topics Concern  . Not on file  Social History Narrative  . Not on file   Social Determinants of Health   Financial Resource Strain: Not on file  Food Insecurity: Not on file  Transportation Needs: Not on file  Physical Activity: Not on file  Stress: Not on file  Social Connections: Not on file  Intimate Partner Violence: Not on file      Review of Systems  Constitutional: Negative for chills, fatigue and unexpected weight change.  HENT: Negative for congestion, postnasal drip, rhinorrhea, sneezing and sore throat.   Eyes: Negative for redness.  Respiratory: Negative for cough, chest tightness and shortness of breath.    Cardiovascular: Negative for chest pain and palpitations.  Gastrointestinal: Negative for abdominal pain, constipation, diarrhea, nausea and vomiting.  Genitourinary: Negative for dysuria and frequency.  Musculoskeletal: Positive for arthralgias. Negative for back pain, joint swelling and neck pain.  Skin: Negative for rash.  Neurological: Negative.  Negative for tremors and numbness.  Hematological: Negative for adenopathy. Does not bruise/bleed easily.  Psychiatric/Behavioral: Negative for behavioral problems (Depression), sleep disturbance and suicidal ideas. The patient is not nervous/anxious.     Vital Signs: BP 134/82   Pulse 78   Temp 98.4 F (36.9 C)   Resp 16   Ht 5\' 3"  (1.6 m)   Wt 221 lb 3.2 oz (100.3 kg)   LMP  (LMP Unknown)   SpO2 93%   BMI 39.18 kg/m    Physical Exam Vitals and nursing note reviewed.  Constitutional:      General: She is not in acute distress.    Appearance: She is well-developed. She is obese. She is not diaphoretic.  HENT:     Head: Normocephalic and atraumatic.     Mouth/Throat:     Pharynx: No oropharyngeal exudate.  Eyes:     Pupils: Pupils are equal, round, and reactive to light.  Neck:     Thyroid: No thyromegaly.     Vascular: No JVD.     Trachea: No tracheal deviation.  Cardiovascular:     Rate and Rhythm: Normal rate and regular rhythm.     Heart sounds: Normal heart sounds. No murmur heard. No friction rub. No gallop.   Pulmonary:     Effort: Pulmonary effort is normal. No respiratory distress.     Breath sounds: No wheezing or rales.  Chest:     Chest wall: No tenderness.  Abdominal:     General: Bowel sounds are normal.     Palpations: Abdomen is soft.  Musculoskeletal:        General: Normal range of motion.     Cervical back: Normal range of motion and neck supple.  Lymphadenopathy:     Cervical: No cervical adenopathy.  Skin:    General: Skin is warm and dry.  Neurological:     Mental Status: She is alert and  oriented to person, place, and time.     Cranial Nerves: No cranial nerve deficit.  Psychiatric:        Behavior: Behavior normal.        Thought Content: Thought content normal.        Judgment: Judgment normal.        Assessment/Plan: 1. Mixed hyperlipidemia Continue Crestor daily  2. Bilateral carotid bruits Carotid US results showed <50% stenosis bilaterally and were reassuring, will continue crestor  3. Essential hypertension Stable, continue current therapy  4. Moderate mitral valve regurgitation Stable, will continue to  monitor progression and symptoms  5. Vitamin D deficiency Continue supplement will recheck labs  6. Obesity (BMI 35.0-39.9 without comorbidity) Discussed the importance of weight management through healthy eating and daily exercise as tolerated. Discussed the negative effects obesity has on pulmonary health, cardiac health as well as overall general health and well being.  7. Other fatigue - CBC w/Diff/Platelet - Comprehensive metabolic panel - TSH + free T4 - VITAMIN D 25 Hydroxy (Vit-D Deficiency, Fractures) - Lipid Panel With LDL/HDL Ratio   General Counseling: Brielynn verbalizes understanding of the findings of todays visit and agrees with plan of treatment. I have discussed any further diagnostic evaluation that may be needed or ordered today. We also reviewed her medications today. she has been encouraged to call the office with any questions or concerns that should arise related to todays visit.    Orders Placed This Encounter  Procedures  . CBC w/Diff/Platelet  . Comprehensive metabolic panel  . TSH + free T4  . VITAMIN D 25 Hydroxy (Vit-D Deficiency, Fractures)  . Lipid Panel With LDL/HDL Ratio    No orders of the defined types were placed in this encounter.   This patient was seen by Drema Dallas, PA-C in collaboration with Dr. Clayborn Bigness as a part of collaborative care agreement.   Total time spent:30 Minutes Time spent  includes review of chart, medications, test results, and follow up plan with the patient.      Dr Lavera Guise Internal medicine

## 2021-01-23 ENCOUNTER — Telehealth: Payer: Self-pay | Admitting: Internal Medicine

## 2021-01-23 NOTE — Progress Notes (Signed)
  Chronic Care Management   Outreach Note  01/23/2021 Name: Ashley Mckay MRN: [id number] DOB: 1951/04/05  Referred by: Lavera Guise, MD Reason for referral : No chief complaint on file.   An unsuccessful telephone outreach was attempted today. The patient was referred to the pharmacist for assistance with care management and care coordination.   Follow Up Plan:   Carley Perdue [employer] Scheduler

## 2021-01-30 DIAGNOSIS — G4733 Obstructive sleep apnea (adult) (pediatric): Secondary | ICD-10-CM | POA: Diagnosis not present

## 2021-01-31 ENCOUNTER — Telehealth: Payer: Self-pay | Admitting: Internal Medicine

## 2021-01-31 NOTE — Progress Notes (Signed)
  Chronic Care Management   Outreach Note  01/31/2021 Name: SHAKEERAH GRADEL MRN: [id number] DOB: Sep 05, 1951  Referred by: Lavera Guise, MD Reason for referral : No chief complaint on file.   A second unsuccessful telephone outreach was attempted today. The patient was referred to pharmacist for assistance with care management and care coordination.  Follow Up Plan:   Tatjana Dellinger [employer] scheduler

## 2021-02-09 ENCOUNTER — Telehealth: Payer: Self-pay | Admitting: Internal Medicine

## 2021-02-09 NOTE — Chronic Care Management (AMB) (Signed)
  Chronic Care Management   Outreach Note  02/09/2021 Name: Ashley Mckay MRN: [id number] DOB: [birth year]  Referred by: Lavera Guise, MD Reason for referral : No chief complaint on file.   Third unsuccessful telephone outreach was attempted today. The patient was referred to the pharmacist for assistance with care management and care coordination.   Follow Up Plan:   Tatjana Dellinger [employer] Scheduler

## 2021-02-25 NOTE — Procedures (Unsigned)
[place], [place] [address]  DATE OF SERVICE: January 03, 2021  CAROTID DOPPLER INTERPRETATION:  Bilateral Carotid Ultrsasound and Color Doppler Examination was performed. The RIGHT CCA shows no significant plaque in the vessel. The LEFT CCA shows insignificant plaque in the vessel. There was no intimal thickening noted in the RIGHT carotid artery. There was no significant intimal thickening in the LEFT carotid artery.  The RIGHT CCA shows peak systolic velocity of 64 cm per second. The end diastolic velocity is 23 cm per second on the RIGHT side. The RIGHT ICA shows peak systolic velocity of 79 per second. RIGHT sided ICA end diastolic velocity is 27 cm per second. The RIGHT ECA shows a flow rate of 91. The ICA/CCA ratio is calculated to be 0.99. This suggests less than 50% stenosis. The right vertebral artery was not seen.  The LEFT CCA shows peak systolic velocity of 85 cm per second. The end diastolic velocity is 26 cm per second on the LEFT side. The LEFT ICA shows peak systolic velocity of 73 per second. LEFT sided ICA end diastolic velocity is 23 cm per second. The LEFT ECA shows a peak systolic velocity of 91 cm per second. The ICA/CCA ratio is calculated to be 1.08. This suggests less than 50% stenosis. The Vertebral Artery shows antegrade flow.   Impression:    The RIGHT CAROTID shows less than 50% stenosis. The LEFT CAROTID shows less than 50% stenosis.  There is no significant plaque formation noted on the LEFT and no significant on the RIGHT  side. Consider a repeat Carotid doppler if clinical situation and symptoms warrant in 6-12 months. Patient should be encouraged to change lifestyles such as smoking cessation, regular exercise and dietary modification. Use of statins in the right clinical setting and ASA is encouraged.  Allyne Gee, MD [facility] Pulmonary Critical Care Medicine

## 2021-03-30 ENCOUNTER — Other Ambulatory Visit: Payer: Self-pay | Admitting: Nurse Practitioner

## 2021-03-30 DIAGNOSIS — E782 Mixed hyperlipidemia: Secondary | ICD-10-CM

## 2021-04-05 ENCOUNTER — Other Ambulatory Visit: Payer: Self-pay | Admitting: Internal Medicine

## 2021-04-05 ENCOUNTER — Other Ambulatory Visit: Payer: Self-pay | Admitting: Nurse Practitioner

## 2021-04-05 DIAGNOSIS — J301 Allergic rhinitis due to pollen: Secondary | ICD-10-CM

## 2021-04-05 DIAGNOSIS — R32 Unspecified urinary incontinence: Secondary | ICD-10-CM

## 2021-04-05 DIAGNOSIS — I10 Essential (primary) hypertension: Secondary | ICD-10-CM

## 2021-04-16 ENCOUNTER — Encounter: Payer: Self-pay | Admitting: Internal Medicine

## 2021-04-16 ENCOUNTER — Other Ambulatory Visit: Payer: Self-pay

## 2021-04-16 ENCOUNTER — Ambulatory Visit: Payer: Medicare HMO | Admitting: Internal Medicine

## 2021-04-16 VITALS — Resp 16 | Ht 62.0 in | Wt 223.8 lb

## 2021-04-16 DIAGNOSIS — E669 Obesity, unspecified: Secondary | ICD-10-CM | POA: Diagnosis not present

## 2021-04-16 DIAGNOSIS — G4733 Obstructive sleep apnea (adult) (pediatric): Secondary | ICD-10-CM | POA: Diagnosis not present

## 2021-04-16 DIAGNOSIS — Z7189 Other specified counseling: Secondary | ICD-10-CM

## 2021-04-16 DIAGNOSIS — I340 Nonrheumatic mitral (valve) insufficiency: Secondary | ICD-10-CM

## 2021-04-16 NOTE — Progress Notes (Signed)
[facility] [place], [place] [address]  Pulmonary Sleep Medicine   Office Visit Note  Patient Name: Ashley Mckay DOB: [birth year] MRN [id number]  Date of Service: 04/16/2021  Complaints/HPI: OSA. She has been using her CPAP device as prescribed. Patient had PFT done which shows restrictive disease likely related to her weight. Compliance has been good with PAP therapy. She likely has COPD. Patient has no chest pain noted. Denies having fevers noted. No cough at this time. Patient has no GI issues. Also has no issues with the PAP therapy  ROS  General: (-) fever, (-) chills, (-) night sweats, (-) weakness Skin: (-) rashes, (-) itching,. Eyes: (-) visual changes, (-) redness, (-) itching. Nose and Sinuses: (-) nasal stuffiness or itchiness, (-) postnasal drip, (-) nosebleeds, (-) sinus trouble. Mouth and Throat: (-) sore throat, (-) hoarseness. Neck: (-) swollen glands, (-) enlarged thyroid, (-) neck pain. Respiratory: - cough, (-) bloody sputum, - shortness of breath, - wheezing. Cardiovascular: - ankle swelling, (-) chest pain. Lymphatic: (-) lymph node enlargement. Neurologic: (-) numbness, (-) tingling. Psychiatric: (-) anxiety, (-) depression   Current Medication: Outpatient Encounter Medications as of 04/16/2021  Medication Sig Note   Docusate Calcium (STOOL SOFTENER PO) Take by mouth.    ibuprofen (ADVIL,MOTRIN) 800 MG tablet 800 mg every 8 (eight) hours as needed.  06/21/2014: Received from: External Pharmacy   levocetirizine (XYZAL) 5 MG tablet Take 1 tablet (5 mg total) by mouth every evening.    Magnesium 250 MG TABS Take by mouth.    ondansetron (ZOFRAN) 4 MG tablet Take 1 tablet (4 mg total) by mouth every 8 (eight) hours as needed for nausea or vomiting.    oxybutynin (DITROPAN-XL) 10 MG 24 hr tablet TAKE ONE TABLET BY MOUTH DAILY AT BEDTIME    propranolol ER (INDERAL LA) 80 MG 24 hr capsule Take 1 capsule (80 mg total) by mouth daily.     rosuvastatin (CRESTOR) 10 MG tablet Take 1 tablet (10 mg total) by mouth daily.    triamcinolone cream (KENALOG) 0.1 % Apply 1 application topically 2 (two) times daily.    VITAMIN D, CHOLECALCIFEROL, PO Take 1,000 Int'l Units by mouth daily.    [DISCONTINUED] meloxicam (MOBIC) 7.5 MG tablet Take 1 tablet (7.5 mg total) by mouth daily. (Patient not taking: Reported on 04/16/2021)    No facility-administered encounter medications on file as of 04/16/2021.    Surgical History: Past Surgical History:  Procedure Laterality Date   COLONOSCOPY     COLONOSCOPY WITH PROPOFOL N/A 01/31/2016   Procedure: COLONOSCOPY WITH PROPOFOL;  Surgeon: Manya Silvas, MD;  Location: [facility] ENDOSCOPY;  Service: Endoscopy;  Laterality: N/A;   ESOPHAGOGASTRODUODENOSCOPY     tooth implant     started in 2020 but finished dec 2021    Medical History: Past Medical History:  Diagnosis Date   Arthritis    Headache    History of chickenpox    Hypertension    Sleep apnea     Family History: Family History  Problem Relation Age of Onset   Breast cancer Mother 20    Social History: Social History   Socioeconomic History   Marital status: Married    Spouse name: Not on file   Number of children: Not on file   Years of education: Not on file   Highest education level: Not on file  Occupational History   Not on file  Tobacco Use   Smoking status: Never   Smokeless  tobacco: Never  Substance and Sexual Activity   Alcohol use: Yes    Comment: rarely   Drug use: No   Sexual activity: Not on file  Other Topics Concern   Not on file  Social History Narrative   Not on file   Social Determinants of Health   Financial Resource Strain: Not on file  Food Insecurity: Not on file  Transportation Needs: Not on file  Physical Activity: Not on file  Stress: Not on file  Social Connections: Not on file  Intimate Partner Violence: Not on file    Vital Signs: Resp. rate 16, height '5\' 2"'$  (1.575 m), weight  223 lb 12.8 oz (101.5 kg).  Examination: General Appearance: The patient is well-developed, well-nourished, and in no distress. Skin: Gross inspection of skin unremarkable. Head: normocephalic, no gross deformities. Eyes: no gross deformities noted. ENT: ears appear grossly normal no exudates. Neck: Supple. No thyromegaly. No LAD. Respiratory: no rhonchi noted. Cardiovascular: Normal S1 and S2 without murmur or rub. Extremities: No cyanosis. pulses are equal. Neurologic: Alert and oriented. No involuntary movements.  LABS: No results found for this or any previous visit (from the past 2160 hour(s)).  Radiology: MM 3D SCREEN BREAST BILATERAL  Result Date: 05/17/2020 CLINICAL DATA:  Screening. EXAM: DIGITAL SCREENING BILATERAL MAMMOGRAM WITH TOMO AND CAD COMPARISON:  Previous exam(s). ACR Breast Density Category c: The breast tissue is heterogeneously dense, which may obscure small masses. FINDINGS: There are no findings suspicious for malignancy. Images were processed with CAD. IMPRESSION: No mammographic evidence of malignancy. A result letter of this screening mammogram will be mailed directly to the patient. RECOMMENDATION: Screening mammogram in one year. (Code:SM-B-01Y) BI-RADS CATEGORY  1: Negative. Electronically Signed   By: Kristopher Oppenheim M.D.   On: 05/17/2020 10:20    No results found.  No results found.    Assessment and Plan: Patient Active Problem List   Diagnosis Date Noted   Encounter for screening mammogram for malignant neoplasm of breast 04/24/2020   OSA on CPAP 01/07/2020   Urinary incontinence 04/25/2019   Need for vaccination against Streptococcus pneumoniae using pneumococcal conjugate vaccine 13 06/30/2018   Rash 04/14/2018   Vaginal candidiasis 04/14/2018   Stomatitis and mucositis 04/14/2018   Other atopic dermatitis 04/14/2018   Nausea 04/14/2018   Encounter for general adult medical examination with abnormal findings 12/29/2017   Essential  hypertension 12/29/2017   Mixed hyperlipidemia 12/29/2017   Dysuria 12/29/2017   Non-seasonal allergic rhinitis due to pollen 12/29/2017   Unspecified menopausal and perimenopausal disorder 12/29/2017   Urinary tract infection with hematuria 12/29/2017   Neoplasm of uncertain behavior of skin of lower extremity 12/29/2017   Plantar fasciitis 09/11/2015   Paronychia 08/25/2014    1. OSA (obstructive sleep apnea) On CPAP therapy will be continued on current pressures compliance is good  2. CPAP use counseling CPAP Counseling: had a lengthy discussion with the patient regarding the importance of PAP therapy in management of the sleep apnea. Patient appears to understand the risk factor reduction and also understands the risks associated with untreated sleep apnea. Patient will try to make a good faith effort to remain compliant with therapy. Also instructed the patient on proper cleaning of the device including the water must be changed daily if possible and use of distilled water is preferred. Patient understands that the machine should be regularly cleaned with appropriate recommended cleaning solutions that do not damage the PAP machine for example given white vinegar and water rinses. Other methods  such as ozone treatment may not be as good as these simple methods to achieve cleaning.   3. Obesity (BMI 35.0-39.9 without comorbidity) Obesity Counseling: Had a lengthy discussion regarding patients BMI and weight issues. Patient was instructed on portion control as well as increased activity. Also discussed caloric restrictions with trying to maintain intake less than 2000 Kcal. Discussions were made in accordance with the 5As of weight management. Simple actions such as not eating late and if able to, taking a walk is suggested.    4. 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  I have  also discussed any further diagnostic evaluation thatmay be needed or ordered today. Ronnica verbalizes understanding of the findings of todays visit. We also reviewed her medications today and discussed drug interactions and side effects including but not limited excessive drowsiness and altered mental states. We also discussed that there is always a risk not just to her but also people around her. she has been encouraged to call the office with any questions or concerns that should arise related to todays visit.  No orders of the defined types were placed in this encounter.    Time spent: 58  I have personally obtained a history, examined the patient, evaluated laboratory and imaging results, formulated the assessment and plan and placed orders.    Allyne Gee, MD [facility] Pul[facility] Sleep medicine

## 2021-04-16 NOTE — Patient Instructions (Signed)
Sleep Apnea Sleep apnea affects breathing during sleep. It causes breathing to stop for 10 seconds or more, or to become shallow. People with sleep apnea usually snoreloudly. It can also increase the risk of: Heart attack. Stroke. Being very overweight (obese). Diabetes. Heart failure. Irregular heartbeat. High blood pressure. The goal of treatment is to help you breathe normally again. What are the causes?  The most common cause of this condition is a collapsed or blocked airway. There are three kinds of sleep apnea: Obstructive sleep apnea. This is caused by a blocked or collapsed airway. Central sleep apnea. This happens when the brain does not send the right signals to the muscles that control breathing. Mixed sleep apnea. This is a combination of obstructive and central sleep apnea. What increases the risk? Being overweight. Smoking. Having a small airway. Being older. Being female. Drinking alcohol. Taking medicines to calm yourself (sedatives or tranquilizers). Having family members with the condition. Having a tongue or tonsils that are larger than normal. What are the signs or symptoms? Trouble staying asleep. Loud snoring. Headaches in the morning. Waking up gasping. Dry mouth or sore throat in the morning. Being sleepy or tired during the day. If you are sleepy or tired during the day, you may also: Not be able to focus your mind (concentrate). Forget things. Get angry a lot and have mood swings. Feel sad (depressed). Have changes in your personality. Have less interest in sex, if you are female. Be unable to have an erection, if you are female. How is this treated?  Sleeping on your side. Using a medicine to get rid of mucus in your nose (decongestant). Avoiding the use of alcohol, medicines to help you relax, or certain pain medicines (narcotics). Losing weight, if needed. Changing your diet. Quitting smoking. Using a machine to open your airway while you  sleep, such as: An oral appliance. This is a mouthpiece that shifts your lower jaw forward. A CPAP device. This device blows air through a mask when you breathe out (exhale). An EPAP device. This has valves that you put in each nostril. A BPAP device. This device blows air through a mask when you breathe in (inhale) and breathe out. Having surgery if other treatments do not work. Follow these instructions at home: Lifestyle Make changes that your doctor recommends. Eat a healthy diet. Lose weight if needed. Avoid alcohol, medicines to help you relax, and some pain medicines. Do not smoke or use any products that contain nicotine or tobacco. If you need help quitting, ask your doctor. General instructions Take over-the-counter and prescription medicines only as told by your doctor. If you were given a machine to use while you sleep, use it only as told by your doctor. If you are having surgery, make sure to tell your doctor you have sleep apnea. You may need to bring your device with you. Keep all follow-up visits. Contact a doctor if: The machine that you were given to use during sleep bothers you or does not seem to be working. You do not get better. You get worse. Get help right away if: Your chest hurts. You have trouble breathing in enough air. You have an uncomfortable feeling in your back, arms, or stomach. You have trouble talking. One side of your body feels weak. A part of your face is hanging down. These symptoms may be an emergency. Get help right away. Call your local emergency services (911 in the [place] Do not wait to see if the symptoms   will go away. Do not drive yourself to the hospital. Summary This condition affects breathing during sleep. The most common cause is a collapsed or blocked airway. The goal of treatment is to help you breathe normally while you sleep. This information is not intended to replace advice given to you by your health care provider. Make  sure you discuss any questions you have with your healthcare provider. Document Revised: 08/04/2020 Document Reviewed: 08/04/2020 [employer] Patient Education  2022 [employer]  

## 2021-04-17 DIAGNOSIS — E559 Vitamin D deficiency, unspecified: Secondary | ICD-10-CM | POA: Diagnosis not present

## 2021-04-17 DIAGNOSIS — E782 Mixed hyperlipidemia: Secondary | ICD-10-CM | POA: Diagnosis not present

## 2021-04-17 DIAGNOSIS — R5383 Other fatigue: Secondary | ICD-10-CM | POA: Diagnosis not present

## 2021-04-18 LAB — COMPREHENSIVE METABOLIC PANEL
ALT: 11 IU/L (ref 0–32)
AST: 17 IU/L (ref 0–40)
Albumin/Globulin Ratio: 1.7 (ref 1.2–2.2)
Albumin: 4.2 g/dL (ref 3.8–4.8)
Alkaline Phosphatase: 100 IU/L (ref 44–121)
BUN/Creatinine Ratio: 25 (ref 12–28)
BUN: 16 mg/dL (ref 8–27)
Bilirubin Total: 0.5 mg/dL (ref 0.0–1.2)
CO2: 25 mmol/L (ref 20–29)
Calcium: 9.6 mg/dL (ref 8.7–10.3)
Chloride: 102 mmol/L (ref 96–106)
Creatinine, Ser: 0.65 mg/dL (ref 0.57–1.00)
Globulin, Total: 2.5 g/dL (ref 1.5–4.5)
Glucose: 102 mg/dL — ABNORMAL HIGH (ref 65–99)
Potassium: 4.4 mmol/L (ref 3.5–5.2)
Sodium: 139 mmol/L (ref 134–144)
Total Protein: 6.7 g/dL (ref 6.0–8.5)
eGFR: 96 mL/min/{1.73_m2} (ref >59)

## 2021-04-18 LAB — CBC WITH DIFFERENTIAL/PLATELET
Basophils Absolute: 0 10*3/uL (ref 0.0–0.2)
Basos: 1 %
EOS (ABSOLUTE): 0.3 10*3/uL (ref 0.0–0.4)
Eos: 4 %
Hematocrit: 40.9 % (ref 34.0–46.6)
Hemoglobin: 13.6 g/dL (ref 11.1–15.9)
Immature Grans (Abs): 0 10*3/uL (ref 0.0–0.1)
Immature Granulocytes: 0 %
Lymphocytes Absolute: 1.5 10*3/uL (ref 0.7–3.1)
Lymphs: 22 %
MCH: 30.6 pg (ref 26.6–33.0)
MCHC: 33.3 g/dL (ref 31.5–35.7)
MCV: 92 fL (ref 79–97)
Monocytes Absolute: 0.9 10*3/uL (ref 0.1–0.9)
Monocytes: 12 %
Neutrophils Absolute: 4.3 10*3/uL (ref 1.4–7.0)
Neutrophils: 61 %
Platelets: 245 10*3/uL (ref 150–450)
RBC: 4.44 x10E6/uL (ref 3.77–5.28)
RDW: 12.3 % (ref 11.7–15.4)
WBC: 7.1 10*3/uL (ref 3.4–10.8)

## 2021-04-18 LAB — LIPID PANEL WITH LDL/HDL RATIO
Cholesterol, Total: 161 mg/dL (ref 100–199)
HDL: 50 mg/dL (ref >39)
LDL Chol Calc (NIH): 80 mg/dL (ref 0–99)
LDL/HDL Ratio: 1.6 ratio (ref 0.0–3.2)
Triglycerides: 182 mg/dL — ABNORMAL HIGH (ref 0–149)
VLDL Cholesterol Cal: 31 mg/dL (ref 5–40)

## 2021-04-18 LAB — VITAMIN D 25 HYDROXY (VIT D DEFICIENCY, FRACTURES): Vit D, 25-Hydroxy: 30.1 ng/mL (ref 30.0–100.0)

## 2021-04-18 LAB — TSH+FREE T4
Free T4: 1.12 ng/dL (ref 0.82–1.77)
TSH: 2.85 u[IU]/mL (ref 0.450–4.500)

## 2021-04-26 ENCOUNTER — Ambulatory Visit (INDEPENDENT_AMBULATORY_CARE_PROVIDER_SITE_OTHER): Payer: Medicare HMO | Admitting: Physician Assistant

## 2021-04-26 ENCOUNTER — Other Ambulatory Visit: Payer: Self-pay

## 2021-04-26 ENCOUNTER — Encounter: Payer: Self-pay | Admitting: Physician Assistant

## 2021-04-26 DIAGNOSIS — R32 Unspecified urinary incontinence: Secondary | ICD-10-CM

## 2021-04-26 DIAGNOSIS — Z6841 Body Mass Index (BMI) 40.0 and over, adult: Secondary | ICD-10-CM

## 2021-04-26 DIAGNOSIS — I10 Essential (primary) hypertension: Secondary | ICD-10-CM

## 2021-04-26 DIAGNOSIS — E782 Mixed hyperlipidemia: Secondary | ICD-10-CM

## 2021-04-26 DIAGNOSIS — J301 Allergic rhinitis due to pollen: Secondary | ICD-10-CM | POA: Diagnosis not present

## 2021-04-26 DIAGNOSIS — R300 Dysuria: Secondary | ICD-10-CM

## 2021-04-26 DIAGNOSIS — Z1231 Encounter for screening mammogram for malignant neoplasm of breast: Secondary | ICD-10-CM

## 2021-04-26 DIAGNOSIS — G4733 Obstructive sleep apnea (adult) (pediatric): Secondary | ICD-10-CM

## 2021-04-26 DIAGNOSIS — Z0001 Encounter for general adult medical examination with abnormal findings: Secondary | ICD-10-CM

## 2021-04-26 MED ORDER — PROPRANOLOL HCL ER 80 MG PO CP24: 80.0000 mg | ORAL_CAPSULE | Freq: Every day | ORAL | 3 refills | Status: DC

## 2021-04-26 MED ORDER — OXYBUTYNIN CHLORIDE ER 10 MG PO TB24: 10.0000 mg | ORAL_TABLET | Freq: Every day | ORAL | 2 refills | Status: DC

## 2021-04-26 MED ORDER — LEVOCETIRIZINE DIHYDROCHLORIDE 5 MG PO TABS: 5.0000 mg | ORAL_TABLET | Freq: Every evening | ORAL | 3 refills | Status: DC

## 2021-04-26 MED ORDER — ROSUVASTATIN CALCIUM 10 MG PO TABS: 10.0000 mg | ORAL_TABLET | Freq: Every day | ORAL | 3 refills | Status: DC

## 2021-04-26 NOTE — Progress Notes (Signed)
[facility] [place], [place] [address]  Internal MEDICINE  Office Visit Note  Patient Name: Ashley Mckay  [id number]  [id number]  Date of Service: 04/26/2021  Chief Complaint  Patient presents with   Medicare Wellness   Hypertension     HPI Pt is here for routine health maintenance examination -Has been struggling with exercise due to husband's health concerns with stroke and skin condition, has psoriasis and had some other flare pop up. Knows she needs to work on this. -Avoiding fried foods. Baking or grilling meat, mainly chicken. -Bp stable -Wears CPAP nightly -propranolol for migraine prevention -Continues crestor nightly -She is due for mammogram and will call to schedule; UTD on colonoscopy and bone density -Reviewed labs which were normal apart from elevated TG -Sleeping well -Does need refills today  Current Medication: Outpatient Encounter Medications as of 04/26/2021  Medication Sig Note   aspirin EC 81 MG tablet Take 81 mg by mouth daily. Swallow whole.    Docusate Calcium (STOOL SOFTENER PO) Take by mouth.    ibuprofen (ADVIL,MOTRIN) 800 MG tablet 800 mg every 8 (eight) hours as needed.  06/21/2014: Received from: External Pharmacy   Magnesium 250 MG TABS Take by mouth.    ondansetron (ZOFRAN) 4 MG tablet Take 1 tablet (4 mg total) by mouth every 8 (eight) hours as needed for nausea or vomiting.    triamcinolone cream (KENALOG) 0.1 % Apply 1 application topically 2 (two) times daily.    VITAMIN D, CHOLECALCIFEROL, PO Take 1,000 Int'l Units by mouth daily.    [DISCONTINUED] levocetirizine (XYZAL) 5 MG tablet Take 1 tablet (5 mg total) by mouth every evening.    [DISCONTINUED] oxybutynin (DITROPAN-XL) 10 MG 24 hr tablet TAKE ONE TABLET BY MOUTH DAILY AT BEDTIME    [DISCONTINUED] propranolol ER (INDERAL LA) 80 MG 24 hr capsule Take 1 capsule (80 mg total) by mouth daily.    [DISCONTINUED] rosuvastatin (CRESTOR) 10 MG tablet Take 1 tablet  (10 mg total) by mouth daily.    levocetirizine (XYZAL) 5 MG tablet Take 1 tablet (5 mg total) by mouth every evening.    oxybutynin (DITROPAN-XL) 10 MG 24 hr tablet Take 1 tablet (10 mg total) by mouth at bedtime.    propranolol ER (INDERAL LA) 80 MG 24 hr capsule Take 1 capsule (80 mg total) by mouth daily.    rosuvastatin (CRESTOR) 10 MG tablet Take 1 tablet (10 mg total) by mouth daily.    No facility-administered encounter medications on file as of 04/26/2021.    Surgical History: Past Surgical History:  Procedure Laterality Date   COLONOSCOPY     COLONOSCOPY WITH PROPOFOL N/A 01/31/2016   Procedure: COLONOSCOPY WITH PROPOFOL;  Surgeon: Manya Silvas, MD;  Location: [facility] ENDOSCOPY;  Service: Endoscopy;  Laterality: N/A;   ESOPHAGOGASTRODUODENOSCOPY     tooth implant     started in 2020 but finished dec 2021    Medical History: Past Medical History:  Diagnosis Date   Arthritis    Headache    History of chickenpox    Hypertension    Sleep apnea     Family History: Family History  Problem Relation Age of Onset   Breast cancer Mother 15      Review of Systems  Constitutional:  Negative for chills, fatigue and unexpected weight change.  HENT:  Positive for postnasal drip. Negative for congestion, rhinorrhea, sneezing and sore throat.   Eyes:  Negative for redness.  Respiratory:  Negative for  cough, chest tightness and shortness of breath.   Cardiovascular:  Negative for chest pain and palpitations.  Gastrointestinal:  Negative for abdominal pain, constipation, diarrhea, nausea and vomiting.  Genitourinary:  Negative for dysuria and frequency.  Musculoskeletal:  Negative for arthralgias, back pain, joint swelling and neck pain.  Skin:  Negative for rash.  Neurological: Negative.  Negative for tremors and numbness.  Hematological:  Negative for adenopathy. Does not bruise/bleed easily.  Psychiatric/Behavioral:  Negative for behavioral problems (Depression), sleep  disturbance and suicidal ideas. The patient is not nervous/anxious.     Vital Signs: BP 130/70   Pulse 74   Temp 97.8 F (36.6 C)   Resp 16   Ht _0  (1.575 m)   Wt 225 lb (102.1 kg)   LMP  (LMP Unknown)   SpO2 96%   BMI 41.15 kg/m    Physical Exam Vitals and nursing note reviewed.  Constitutional:      General: She is not in acute distress.    Appearance: She is well-developed. She is obese. She is not diaphoretic.  HENT:     Head: Normocephalic and atraumatic.     Mouth/Throat:     Pharynx: No oropharyngeal exudate.  Eyes:     Pupils: Pupils are equal, round, and reactive to light.  Neck:     Thyroid: No thyromegaly.     Vascular: No JVD.     Trachea: No tracheal deviation.  Cardiovascular:     Rate and Rhythm: Normal rate and regular rhythm.     Heart sounds: Normal heart sounds. No murmur heard.   No friction rub. No gallop.  Pulmonary:     Effort: Pulmonary effort is normal. No respiratory distress.     Breath sounds: No wheezing or rales.  Chest:     Chest wall: No tenderness.  Breasts:    Right: Normal. No mass.     Left: Normal. No mass.  Abdominal:     General: Bowel sounds are normal.     Palpations: Abdomen is soft.  Musculoskeletal:        General: Normal range of motion.     Cervical back: Normal range of motion and neck supple.  Lymphadenopathy:     Cervical: No cervical adenopathy.  Skin:    General: Skin is warm and dry.  Neurological:     Mental Status: She is alert and oriented to person, place, and time.     Cranial Nerves: No cranial nerve deficit.  Psychiatric:        Behavior: Behavior normal.        Thought Content: Thought content normal.        Judgment: Judgment normal.     LABS: Recent Results (from the past 2160 hour(s))  CBC w/Diff/Platelet     Status: None   Collection Time: 04/17/21  9:09 AM  Result Value Ref Range   WBC 7.1 3.4 - 10.8 x10E3/uL   RBC 4.44 3.77 - 5.28 x10E6/uL   Hemoglobin 13.6 11.1 - 15.9 g/dL    Hematocrit 40.9 34.0 - 46.6 %   MCV 92 79 - 97 fL   MCH 30.6 26.6 - 33.0 pg   MCHC 33.3 31.5 - 35.7 g/dL   RDW 12.3 11.7 - 15.4 %   Platelets 245 150 - 450 x10E3/uL   Neutrophils 61 Not Estab. %   Lymphs 22 Not Estab. %   Monocytes 12 Not Estab. %   Eos 4 Not Estab. %   Basos 1 Not Estab. %  Neutrophils Absolute 4.3 1.4 - 7.0 x10E3/uL   Lymphocytes Absolute 1.5 0.7 - 3.1 x10E3/uL   Monocytes Absolute 0.9 0.1 - 0.9 x10E3/uL   EOS (ABSOLUTE) 0.3 0.0 - 0.4 x10E3/uL   Basophils Absolute 0.0 0.0 - 0.2 x10E3/uL   Immature Granulocytes 0 Not Estab. %   Immature Grans (Abs) 0.0 0.0 - 0.1 x10E3/uL  Comprehensive metabolic panel     Status: Abnormal   Collection Time: 04/17/21  9:09 AM  Result Value Ref Range   Glucose 102 (H) 65 - 99 mg/dL   BUN 16 8 - 27 mg/dL   Creatinine, Ser 0.65 0.57 - 1.00 mg/dL   eGFR 96 >59 mL/min/1.73   BUN/Creatinine Ratio 25 12 - 28   Sodium 139 134 - 144 mmol/L   Potassium 4.4 3.5 - 5.2 mmol/L   Chloride 102 96 - 106 mmol/L   CO2 25 20 - 29 mmol/L   Calcium 9.6 8.7 - 10.3 mg/dL   Total Protein 6.7 6.0 - 8.5 g/dL   Albumin 4.2 3.8 - 4.8 g/dL   Globulin, Total 2.5 1.5 - 4.5 g/dL   Albumin/Globulin Ratio 1.7 1.2 - 2.2   Bilirubin Total 0.5 0.0 - 1.2 mg/dL   Alkaline Phosphatase 100 44 - 121 IU/L   AST 17 0 - 40 IU/L   ALT 11 0 - 32 IU/L  TSH + free T4     Status: None   Collection Time: 04/17/21  9:09 AM  Result Value Ref Range   TSH 2.850 0.450 - 4.500 uIU/mL   Free T4 1.12 0.82 - 1.77 ng/dL  VITAMIN D 25 Hydroxy (Vit-D Deficiency, Fractures)     Status: None   Collection Time: 04/17/21  9:09 AM  Result Value Ref Range   Vit D, 25-Hydroxy 30.1 30.0 - 100.0 ng/mL    Comment: Vitamin D deficiency has been defined by the Institute of Medicine and an Endocrine Society practice guideline as a level of serum 25-OH vitamin D less than 20 ng/mL (1,2). The Endocrine Society went on to further define vitamin D insufficiency as a level between 21 and 29  ng/mL (2). 1. IOM (Institute of Medicine). 2010. Dietary reference    intakes for calcium and D. [place]: The    [employer]. 2. Holick MF, Binkley [place], Bischoff-Ferrari HA, et al.    Evaluation, treatment, and prevention of vitamin D    deficiency: an Endocrine Society clinical practice    guideline. JCEM. 2011 Jul; 96(7):1911-30.   Lipid Panel With LDL/HDL Ratio     Status: Abnormal   Collection Time: 04/17/21  9:09 AM  Result Value Ref Range   Cholesterol, Total 161 100 - 199 mg/dL   Triglycerides 182 (H) 0 - 149 mg/dL   HDL 50 >39 mg/dL   VLDL Cholesterol Cal 31 5 - 40 mg/dL   LDL Chol Calc (NIH) 80 0 - 99 mg/dL   LDL/HDL Ratio 1.6 0.0 - 3.2 ratio    Comment:                                     LDL/HDL Ratio                                             Men  Women  1/2 Avg.Risk  1.0    1.5                                   Avg.Risk  3.6    3.2                                2X Avg.Risk  6.2    5.0                                3X Avg.Risk  8.0    6.1         Assessment/Plan: 1. Encounter for general adult medical examination with abnormal findings CPE performed, labs reviewed, UTD on colonoscopy and bone density, mammogram ordered  2. Urinary incontinence, unspecified type Stable, continue oxybutynin - oxybutynin (DITROPAN-XL) 10 MG 24 hr tablet; Take 1 tablet (10 mg total) by mouth at bedtime.  Dispense: 90 tablet; Refill: 2  3. Essential hypertension BP well controlled continues propranolol for BP and migraine prevention - propranolol ER (INDERAL LA) 80 MG 24 hr capsule; Take 1 capsule (80 mg total) by mouth daily.  Dispense: 90 capsule; Refill: 3  4. Mixed hyperlipidemia Continue crestor, labs stable - rosuvastatin (CRESTOR) 10 MG tablet; Take 1 tablet (10 mg total) by mouth daily.  Dispense: 90 tablet; Refill: 3  5. Non-seasonal allergic rhinitis due to pollen - levocetirizine (XYZAL) 5 MG tablet; Take 1 tablet (5  mg total) by mouth every evening.  Dispense: 90 tablet; Refill: 3  6. OSA (obstructive sleep apnea) Continue CPAP  7. Encounter for screening mammogram for breast cancer - MM 3D SCREEN BREAST BILATERAL; Future  8. Dysuria - UA/M w/rflx Culture, Routine  9. Morbid Obesity with BMI of 40.0-44.9, adult ([place]) Obesity Counseling: Had a lengthy discussion regarding patients BMI and weight issues. Patient was instructed on portion control as well as increased activity. Also discussed caloric restrictions with trying to maintain intake less than 2000 Kcal. Discussions were made in accordance with the 5As of weight management. Simple actions such as not eating late and if able to, taking a walk is suggested.    General Counseling: Inocencia verbalizes understanding of the findings of todays visit and agrees with plan of treatment. I have discussed any further diagnostic evaluation that may be needed or ordered today. We also reviewed her medications today. she has been encouraged to call the office with any questions or concerns that should arise related to todays visit.    Counseling:    Orders Placed This Encounter  Procedures   MM 3D SCREEN BREAST BILATERAL   UA/M w/rflx Culture, Routine    Meds ordered this encounter  Medications   oxybutynin (DITROPAN-XL) 10 MG 24 hr tablet    Sig: Take 1 tablet (10 mg total) by mouth at bedtime.    Dispense:  90 tablet    Refill:  2   propranolol ER (INDERAL LA) 80 MG 24 hr capsule    Sig: Take 1 capsule (80 mg total) by mouth daily.    Dispense:  90 capsule    Refill:  3   rosuvastatin (CRESTOR) 10 MG tablet    Sig: Take 1 tablet (10 mg total) by mouth daily.    Dispense:  90 tablet    Refill:  3   levocetirizine (XYZAL)  5 MG tablet    Sig: Take 1 tablet (5 mg total) by mouth every evening.    Dispense:  90 tablet    Refill:  3    This patient was seen by Drema Dallas, PA-C in collaboration with Dr. Clayborn Bigness as a part of  collaborative care agreement.  Total time spent:35 Minutes  Time spent includes review of chart, medications, test results, and follow up plan with the patient.     Lavera Guise, MD  Internal Medicine

## 2021-04-30 LAB — UA/M W/RFLX CULTURE, ROUTINE
Bilirubin, UA: NEGATIVE
Glucose, UA: NEGATIVE
Ketones, UA: NEGATIVE
Nitrite, UA: NEGATIVE
Protein,UA: NEGATIVE
RBC, UA: NEGATIVE
Specific Gravity, UA: 1.01 (ref 1.005–1.030)
Urobilinogen, Ur: 0.2 mg/dL (ref 0.2–1.0)
pH, UA: 7 (ref 5.0–7.5)

## 2021-04-30 LAB — MICROSCOPIC EXAMINATION
Bacteria, UA: NONE SEEN
Casts: NONE SEEN /lpf

## 2021-04-30 LAB — URINE CULTURE, REFLEX

## 2021-05-02 DIAGNOSIS — G4733 Obstructive sleep apnea (adult) (pediatric): Secondary | ICD-10-CM | POA: Diagnosis not present

## 2021-05-03 ENCOUNTER — Telehealth: Payer: Self-pay

## 2021-05-03 ENCOUNTER — Other Ambulatory Visit: Payer: Self-pay

## 2021-05-03 DIAGNOSIS — R300 Dysuria: Secondary | ICD-10-CM

## 2021-05-03 MED ORDER — FLUCONAZOLE 150 MG PO TABS: ORAL_TABLET | ORAL | 0 refills | Status: DC

## 2021-05-03 NOTE — Telephone Encounter (Signed)
-----   Message from Mylinda Latina, PA-C sent at 05/01/2021  1:32 PM EDT ----- Please ask patient if she is having any UTI symptoms? Urine from physical showed some growth however C/S did not indicate predominate bacteria and may have had some contamination and would need new sample to recheck if indicated.  Would have her repeat urine at [facility] if any symptoms or concerns.

## 2021-05-03 NOTE — Telephone Encounter (Signed)
Spoke to pt and informed her that Lauren asked for her to go to [facility] and do another UA and that we sent in diflucan to her pharmacy.

## 2021-05-04 DIAGNOSIS — R300 Dysuria: Secondary | ICD-10-CM | POA: Diagnosis not present

## 2021-05-14 LAB — UA/M W/RFLX CULTURE, ROUTINE
Bilirubin, UA: NEGATIVE
Glucose, UA: NEGATIVE
Ketones, UA: NEGATIVE
Nitrite, UA: NEGATIVE
Protein,UA: NEGATIVE
RBC, UA: NEGATIVE
Specific Gravity, UA: 1.015 (ref 1.005–1.030)
Urobilinogen, Ur: 0.2 mg/dL (ref 0.2–1.0)
pH, UA: 6.5 (ref 5.0–7.5)

## 2021-05-14 LAB — MICROSCOPIC EXAMINATION
Bacteria, UA: NONE SEEN
Casts: NONE SEEN /lpf
RBC, Urine: NONE SEEN /hpf (ref 0–2)

## 2021-05-14 LAB — URINE CULTURE, REFLEX

## 2021-05-16 ENCOUNTER — Telehealth: Payer: Self-pay

## 2021-05-16 ENCOUNTER — Other Ambulatory Visit: Payer: Self-pay | Admitting: Physician Assistant

## 2021-05-16 DIAGNOSIS — N3000 Acute cystitis without hematuria: Secondary | ICD-10-CM

## 2021-05-16 MED ORDER — NITROFURANTOIN MONOHYD MACRO 100 MG PO CAPS: ORAL_CAPSULE | ORAL | 0 refills | Status: DC

## 2021-05-16 NOTE — Telephone Encounter (Signed)
-----   Message from Mylinda Latina, PA-C sent at 05/16/2021 12:02 PM EDT ----- Please let pt know that culture did show UTI and I have sent macrobid for her

## 2021-05-16 NOTE — Telephone Encounter (Signed)
Pt informed about labs and that we sent in [place] to her pharmacy.

## 2021-05-29 ENCOUNTER — Other Ambulatory Visit: Payer: Self-pay

## 2021-05-29 ENCOUNTER — Ambulatory Visit
Admission: RE | Admit: 2021-05-29 | Discharge: 2021-05-29 | Disposition: A | Discharge status: Home / Self Care | Payer: Medicare HMO | Source: Ambulatory Visit | Attending: Physician Assistant | Admitting: Physician Assistant

## 2021-05-29 DIAGNOSIS — Z1231 Encounter for screening mammogram for malignant neoplasm of breast: Secondary | ICD-10-CM | POA: Diagnosis not present

## 2021-06-02 ENCOUNTER — Other Ambulatory Visit: Payer: Self-pay | Admitting: Internal Medicine

## 2021-07-30 ENCOUNTER — Ambulatory Visit: Payer: Medicare HMO | Admitting: Physician Assistant

## 2021-07-31 DIAGNOSIS — G4733 Obstructive sleep apnea (adult) (pediatric): Secondary | ICD-10-CM | POA: Diagnosis not present

## 2021-08-16 ENCOUNTER — Encounter: Payer: Self-pay | Admitting: Physician Assistant

## 2021-08-16 ENCOUNTER — Other Ambulatory Visit: Payer: Self-pay

## 2021-08-16 ENCOUNTER — Ambulatory Visit (INDEPENDENT_AMBULATORY_CARE_PROVIDER_SITE_OTHER): Payer: Medicare HMO | Admitting: Physician Assistant

## 2021-08-16 DIAGNOSIS — G4733 Obstructive sleep apnea (adult) (pediatric): Secondary | ICD-10-CM

## 2021-08-16 DIAGNOSIS — I10 Essential (primary) hypertension: Secondary | ICD-10-CM

## 2021-08-16 DIAGNOSIS — Z6841 Body Mass Index (BMI) 40.0 and over, adult: Secondary | ICD-10-CM | POA: Diagnosis not present

## 2021-08-16 DIAGNOSIS — E782 Mixed hyperlipidemia: Secondary | ICD-10-CM

## 2021-08-16 MED ORDER — TOPIRAMATE 25 MG PO TABS
25.0000 mg | ORAL_TABLET | Freq: Every day | ORAL | 2 refills | Status: DC
Start: 2021-08-16 — End: 2022-03-06

## 2021-08-16 NOTE — Progress Notes (Signed)
[facility] [place], [place] [address]  Internal MEDICINE  Office Visit Note  Patient Name: Ashley Mckay  [id number]  [id number]  Date of Service: 08/21/2021  Chief Complaint  Patient presents with   Follow-up   Hypertension    HPI Pt is here for routine follow up -Trouble sleeping sometimes. Stressed about work. Will try melatonin a few hours before bed. -Wearing cpap nightly -She had her mammogram -Did her flu shot and PNA and will call with dates -going to the gym 3-4 times per week and avoiding fried/fatty foods but still not losing weight. Will try adding topiramate especially given her hx of migraines even though these are controlled on propranolol  Current Medication: Outpatient Encounter Medications as of 08/16/2021  Medication Sig Note   aspirin EC 81 MG tablet Take 81 mg by mouth daily. Swallow whole.    Docusate Calcium (STOOL SOFTENER PO) Take by mouth.    fluconazole (DIFLUCAN) 150 MG tablet Take 1 tablet by mouth once and repeat in 3 days if symptoms persist    ibuprofen (ADVIL,MOTRIN) 800 MG tablet 800 mg every 8 (eight) hours as needed.  06/21/2014: Received from: External Pharmacy   levocetirizine (XYZAL) 5 MG tablet Take 1 tablet (5 mg total) by mouth every evening.    Magnesium 250 MG TABS Take by mouth.    nitrofurantoin, macrocrystal-monohydrate, (MACROBID) 100 MG capsule Take 1 cap twice per day for 10 days.    ondansetron (ZOFRAN) 4 MG tablet Take 1 tablet (4 mg total) by mouth every 8 (eight) hours as needed for nausea or vomiting.    oxybutynin (DITROPAN-XL) 10 MG 24 hr tablet Take 1 tablet (10 mg total) by mouth at bedtime.    propranolol ER (INDERAL LA) 80 MG 24 hr capsule Take 1 capsule (80 mg total) by mouth daily.    rosuvastatin (CRESTOR) 10 MG tablet Take 1 tablet (10 mg total) by mouth daily.    topiramate (TOPAMAX) 25 MG tablet Take 1 tablet (25 mg total) by mouth daily.    triamcinolone cream (KENALOG) 0.1 % Apply 1  application topically 2 (two) times daily.    VITAMIN D, CHOLECALCIFEROL, PO Take 1,000 Int'l Units by mouth daily.    No facility-administered encounter medications on file as of 08/16/2021.    Surgical History: Past Surgical History:  Procedure Laterality Date   COLONOSCOPY     COLONOSCOPY WITH PROPOFOL N/A 01/31/2016   Procedure: COLONOSCOPY WITH PROPOFOL;  Surgeon: Manya Silvas, MD;  Location: [facility] ENDOSCOPY;  Service: Endoscopy;  Laterality: N/A;   ESOPHAGOGASTRODUODENOSCOPY     tooth implant     started in 2020 but finished dec 2021    Medical History: Past Medical History:  Diagnosis Date   Arthritis    Headache    History of chickenpox    Hypertension    Sleep apnea     Family History: Family History  Problem Relation Age of Onset   Breast cancer Mother 28    Social History   Socioeconomic History   Marital status: Married    Spouse name: Not on file   Number of children: Not on file   Years of education: Not on file   Highest education level: Not on file  Occupational History   Not on file  Tobacco Use   Smoking status: Never   Smokeless tobacco: Never  Substance and Sexual Activity   Alcohol use: Yes    Comment: rarely   Drug use: No  Sexual activity: Not on file  Other Topics Concern   Not on file  Social History Narrative   Not on file   Social Determinants of Health   Financial Resource Strain: Not on file  Food Insecurity: Not on file  Transportation Needs: Not on file  Physical Activity: Not on file  Stress: Not on file  Social Connections: Not on file  Intimate Partner Violence: Not on file      Review of Systems  Constitutional:  Negative for chills, fatigue and unexpected weight change.  HENT:  Negative for congestion, postnasal drip, rhinorrhea, sneezing and sore throat.   Eyes:  Negative for redness.  Respiratory:  Negative for cough, chest tightness and shortness of breath.   Cardiovascular:  Negative for chest pain and  palpitations.  Gastrointestinal:  Negative for abdominal pain, constipation, diarrhea, nausea and vomiting.  Genitourinary:  Negative for dysuria and frequency.  Musculoskeletal:  Negative for arthralgias, back pain, joint swelling and neck pain.  Skin:  Negative for rash.  Neurological: Negative.  Negative for tremors and numbness.  Hematological:  Negative for adenopathy. Does not bruise/bleed easily.  Psychiatric/Behavioral:  Positive for sleep disturbance. Negative for behavioral problems (Depression) and suicidal ideas. The patient is not nervous/anxious.    Vital Signs: BP 130/80   Pulse 73   Temp 97.8 F (36.6 C)   Resp 16   Ht 5\' 2"  (1.575 m)   Wt 224 lb (101.6 kg)   LMP  (LMP Unknown)   SpO2 94%   BMI 40.97 kg/m    Physical Exam Vitals and nursing note reviewed.  Constitutional:      General: She is not in acute distress.    Appearance: She is well-developed. She is obese. She is not diaphoretic.  HENT:     Head: Normocephalic and atraumatic.     Mouth/Throat:     Pharynx: No oropharyngeal exudate.  Eyes:     Pupils: Pupils are equal, round, and reactive to light.  Neck:     Thyroid: No thyromegaly.     Vascular: No JVD.     Trachea: No tracheal deviation.  Cardiovascular:     Rate and Rhythm: Normal rate and regular rhythm.     Heart sounds: Normal heart sounds. No murmur heard.   No friction rub. No gallop.  Pulmonary:     Effort: Pulmonary effort is normal. No respiratory distress.     Breath sounds: No wheezing or rales.  Chest:     Chest wall: No tenderness.  Breasts:    Right: Normal. No mass.     Left: Normal. No mass.  Abdominal:     General: Bowel sounds are normal.     Palpations: Abdomen is soft.  Musculoskeletal:        General: Normal range of motion.     Cervical back: Normal range of motion and neck supple.  Lymphadenopathy:     Cervical: No cervical adenopathy.  Skin:    General: Skin is warm and dry.  Neurological:     Mental  Status: She is alert and oriented to person, place, and time.     Cranial Nerves: No cranial nerve deficit.  Psychiatric:        Behavior: Behavior normal.        Thought Content: Thought content normal.        Judgment: Judgment normal.       Assessment/Plan: 1. Essential hypertension Well controlled on propranolol also used for migraine control  2. Mixed  hyperlipidemia Continue crestor  3. OSA (obstructive sleep apnea) Continue cpap nightly, may try melatonin a few hours before bed if difficulty sleeping continues  4. Morbid obesity with BMI of 40.0-44.9, adult ([place]) Will try adding topiramate to aid weight loss in addition to working on diet and exercise - topiramate (TOPAMAX) 25 MG tablet; Take 1 tablet (25 mg total) by mouth daily.  Dispense: 30 tablet; Refill: 2   General Counseling: Bitania verbalizes understanding of the findings of todays visit and agrees with plan of treatment. I have discussed any further diagnostic evaluation that may be needed or ordered today. We also reviewed her medications today. she has been encouraged to call the office with any questions or concerns that should arise related to todays visit.    No orders of the defined types were placed in this encounter.   Meds ordered this encounter  Medications   topiramate (TOPAMAX) 25 MG tablet    Sig: Take 1 tablet (25 mg total) by mouth daily.    Dispense:  30 tablet    Refill:  2    This patient was seen by Drema Dallas, PA-C in collaboration with Dr. Clayborn Bigness as a part of collaborative care agreement.   Total time spent:30 Minutes Time spent includes review of chart, medications, test results, and follow up plan with the patient.      Dr Lavera Guise Internal medicine

## 2021-09-29 IMAGING — MG MM DIGITAL SCREENING BILAT W/ TOMO AND CAD
6 of 12 series · 6 of 36 positions shown · non-contrast
Comparison: Previous exam(s).

CLINICAL DATA: Screening.

EXAM:
DIGITAL SCREENING BILATERAL MAMMOGRAM WITH TOMOSYNTHESIS AND CAD
TECHNIQUE: Bilateral screening digital craniocaudal and mediolateral oblique
mammograms were obtained. Bilateral screening digital breast
tomosynthesis was performed. The images were evaluated with
computer-aided detection.

[L MLO synth-2D]
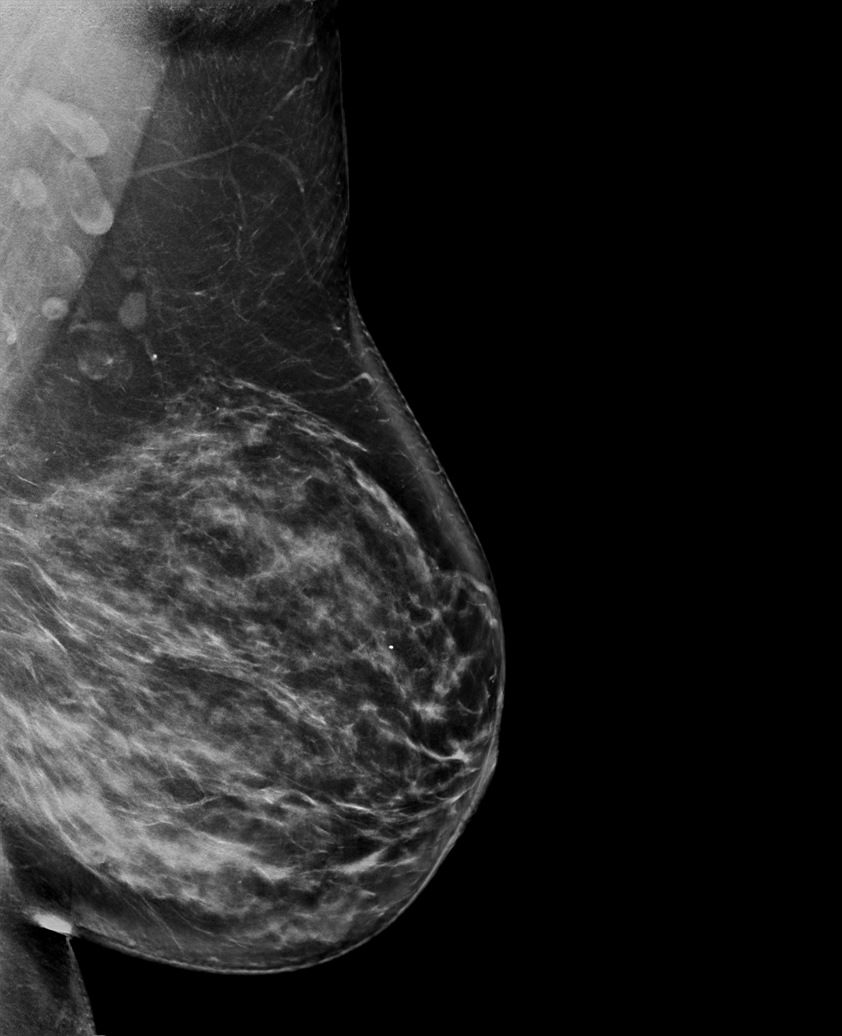

[L XCCL synth-2D]
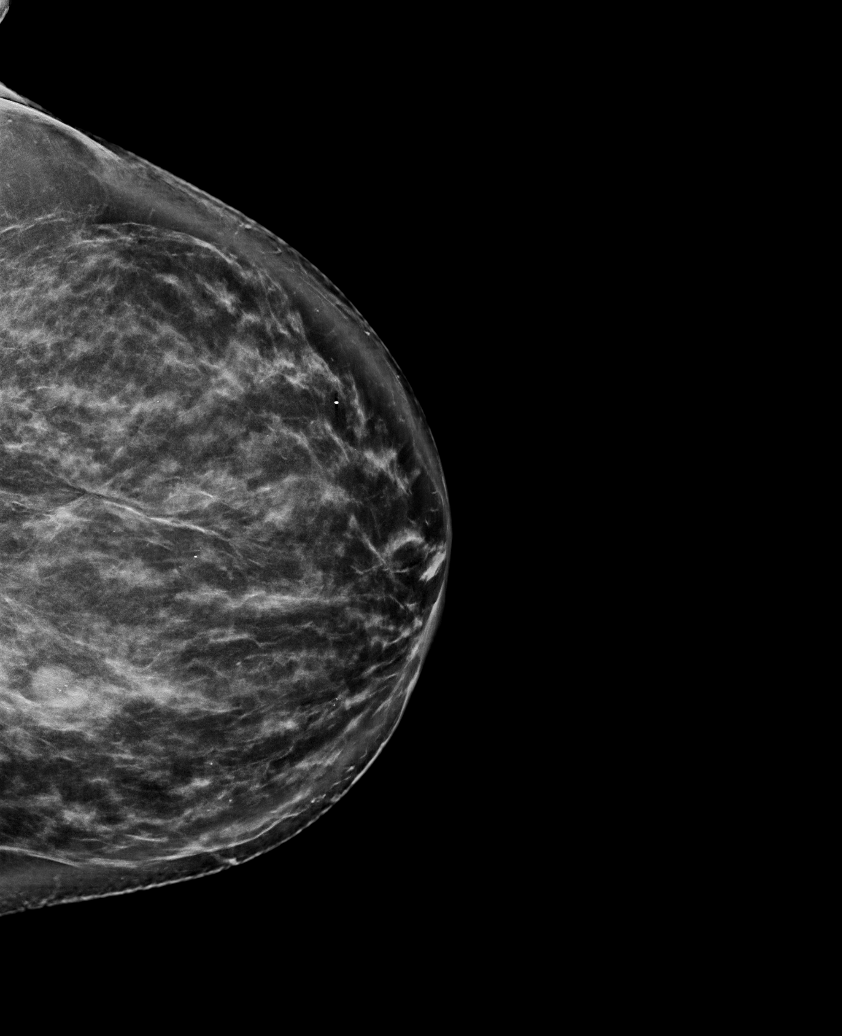

[R MLO synth-2D (1 of 2)]
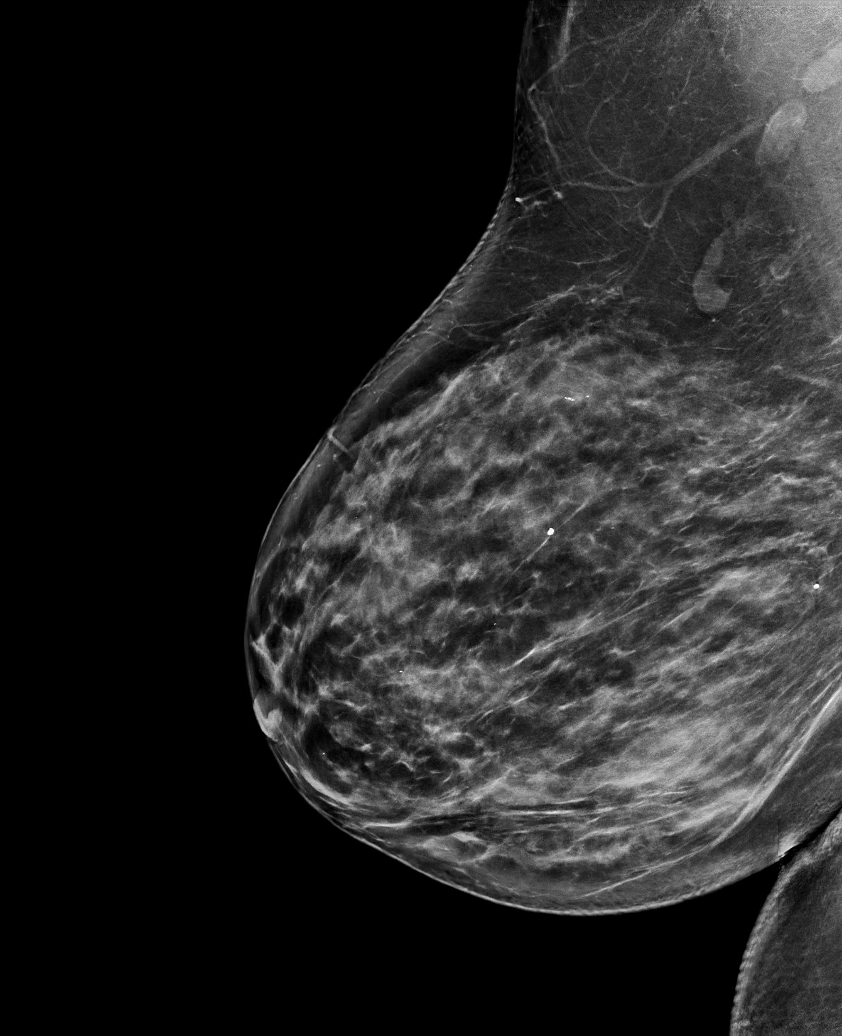

[R MLO synth-2D (2 of 2)]
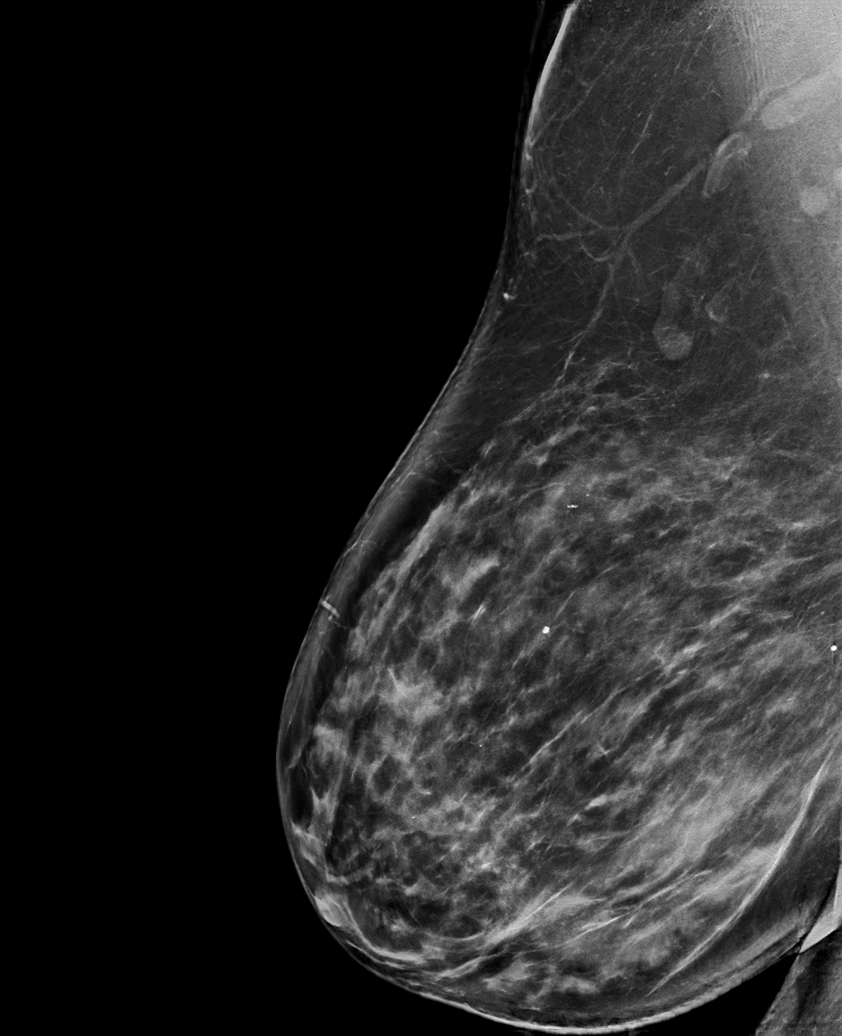

[L CC synth-2D]
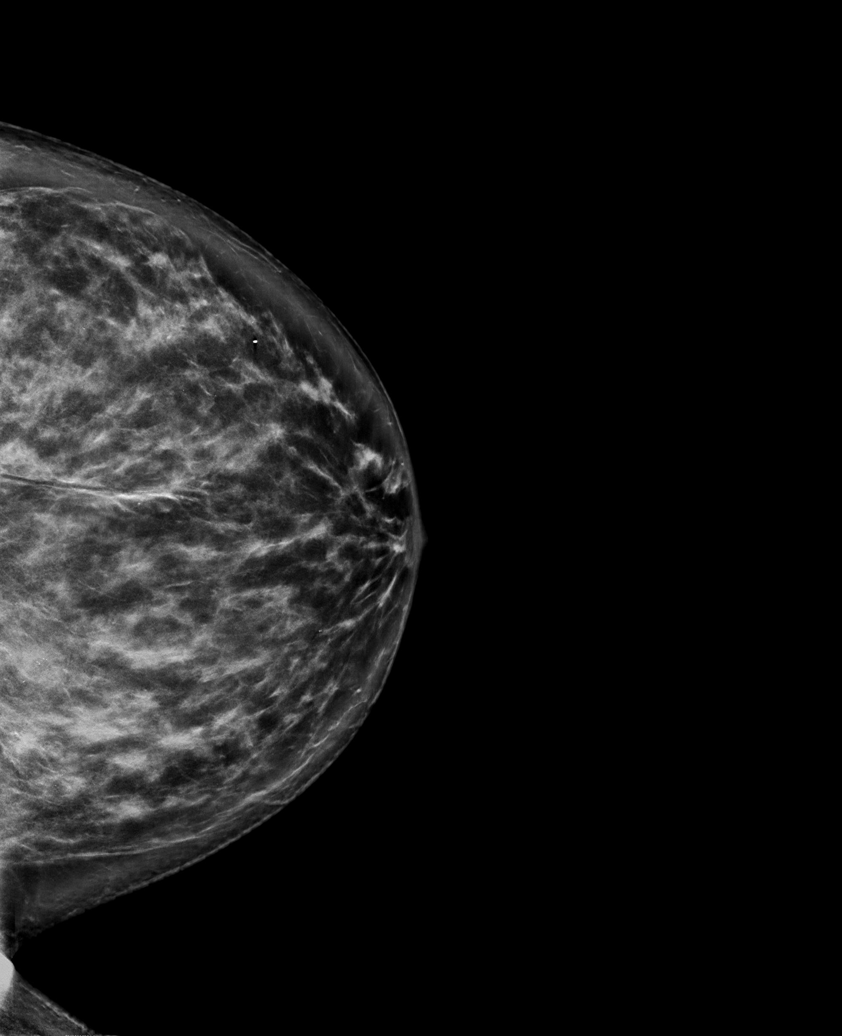

[R CC synth-2D]
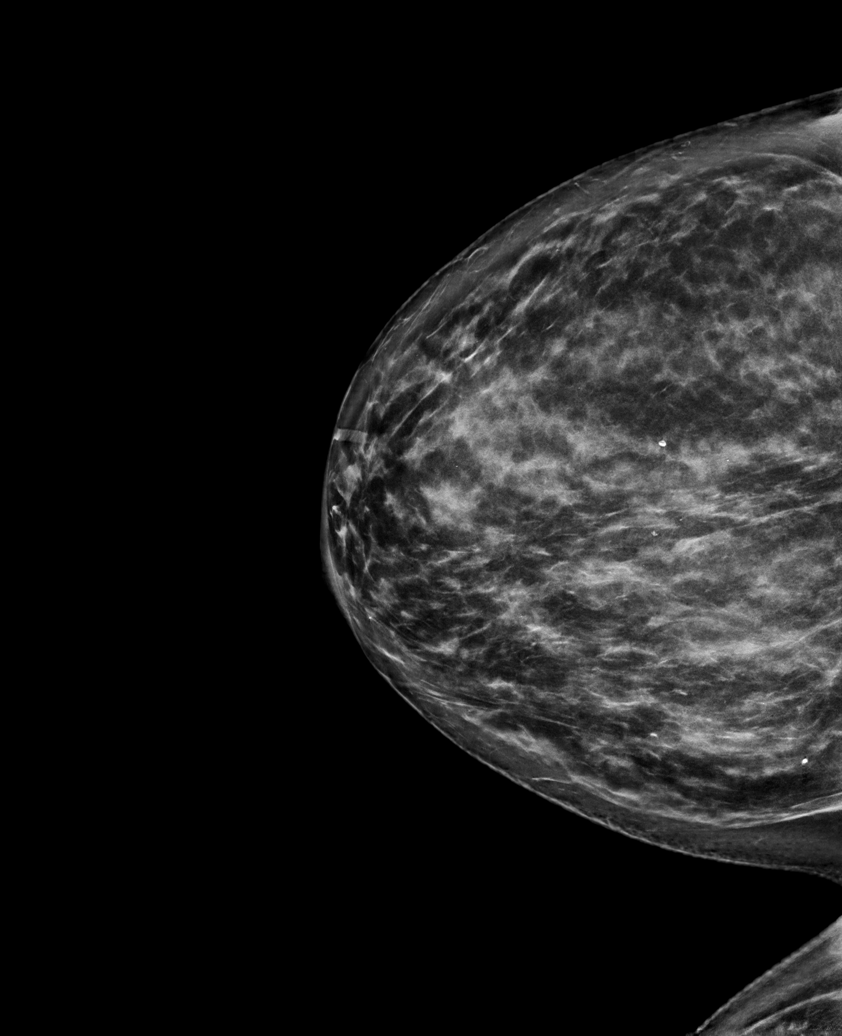

[6 of 36 positions shown; findings below may reference images not displayed]

ACR Breast Density Category c: The breast tissue is heterogeneously
dense, which may obscure small masses.
FINDINGS: There are no findings suspicious for malignancy.
IMPRESSION: No mammographic evidence of malignancy. A result letter of this
screening mammogram will be mailed directly to the patient.

RECOMMENDATION:
Screening mammogram in one year. (Code:Q3-W-BC3)

BI-RADS CATEGORY  1: Negative.

## 2021-10-10 DIAGNOSIS — H36 Retinal disorders in diseases classified elsewhere: Secondary | ICD-10-CM | POA: Diagnosis not present

## 2021-10-10 DIAGNOSIS — H31093 Other chorioretinal scars, bilateral: Secondary | ICD-10-CM | POA: Diagnosis not present

## 2021-10-10 DIAGNOSIS — H524 Presbyopia: Secondary | ICD-10-CM | POA: Diagnosis not present

## 2021-10-10 DIAGNOSIS — Z0100 Encounter for examination of eyes and vision without abnormal findings: Secondary | ICD-10-CM | POA: Diagnosis not present

## 2021-10-10 DIAGNOSIS — H5203 Hypermetropia, bilateral: Secondary | ICD-10-CM | POA: Diagnosis not present

## 2021-10-10 DIAGNOSIS — H52223 Regular astigmatism, bilateral: Secondary | ICD-10-CM | POA: Diagnosis not present

## 2021-10-15 ENCOUNTER — Encounter: Payer: Self-pay | Admitting: Internal Medicine

## 2021-10-15 ENCOUNTER — Other Ambulatory Visit: Payer: Self-pay

## 2021-10-15 ENCOUNTER — Ambulatory Visit: Payer: Medicare HMO | Admitting: Internal Medicine

## 2021-10-15 VITALS — BP 140/84 | HR 83 | Temp 98.0°F | Resp 16 | Ht 63.0 in | Wt 225.6 lb

## 2021-10-15 DIAGNOSIS — J301 Allergic rhinitis due to pollen: Secondary | ICD-10-CM | POA: Diagnosis not present

## 2021-10-15 DIAGNOSIS — G4733 Obstructive sleep apnea (adult) (pediatric): Secondary | ICD-10-CM | POA: Diagnosis not present

## 2021-10-15 NOTE — Patient Instructions (Signed)

## 2021-10-15 NOTE — Progress Notes (Signed)
[facility] [place], [place] [address]  Pulmonary Sleep Medicine   Office Visit Note  Patient Name: Ashley Mckay DOB: 1951/07/16 MRN [id number]  Date of Service: 10/15/2021  Complaints/HPI: OSA on CPAP. She states that she has been using the CPAP device as prescribed. She states that sometimes she has some difficulty getting to sleep and uses OTC sleep aide. Patient states the cpap has made her nights better. She is not snoring. As far as the weight she has been working on losing weight. She has had no admissions to the hospital  ROS  General: (-) fever, (-) chills, (-) night sweats, (-) weakness Skin: (-) rashes, (-) itching,. Eyes: (-) visual changes, (-) redness, (-) itching. Nose and Sinuses: (-) nasal stuffiness or itchiness, (-) postnasal drip, (-) nosebleeds, (-) sinus trouble. Mouth and Throat: (-) sore throat, (-) hoarseness. Neck: (-) swollen glands, (-) enlarged thyroid, (-) neck pain. Respiratory: - cough, (-) bloody sputum, - shortness of breath, - wheezing. Cardiovascular: - ankle swelling, (-) chest pain. Lymphatic: (-) lymph node enlargement. Neurologic: (-) numbness, (-) tingling. Psychiatric: (-) anxiety, (-) depression   Current Medication: Outpatient Encounter Medications as of 10/15/2021  Medication Sig Note   aspirin EC 81 MG tablet Take 81 mg by mouth daily. Swallow whole.    Docusate Calcium (STOOL SOFTENER PO) Take by mouth.    ibuprofen (ADVIL,MOTRIN) 800 MG tablet 800 mg every 8 (eight) hours as needed.  06/21/2014: Received from: External Pharmacy   levocetirizine (XYZAL) 5 MG tablet Take 1 tablet (5 mg total) by mouth every evening.    Magnesium 250 MG TABS Take by mouth.    ondansetron (ZOFRAN) 4 MG tablet Take 1 tablet (4 mg total) by mouth every 8 (eight) hours as needed for nausea or vomiting.    oxybutynin (DITROPAN-XL) 10 MG 24 hr tablet Take 1 tablet (10 mg total) by mouth at bedtime.    propranolol ER (INDERAL LA) 80  MG 24 hr capsule Take 1 capsule (80 mg total) by mouth daily.    rosuvastatin (CRESTOR) 10 MG tablet Take 1 tablet (10 mg total) by mouth daily.    topiramate (TOPAMAX) 25 MG tablet Take 1 tablet (25 mg total) by mouth daily. (Patient not taking: Reported on 10/15/2021)    triamcinolone cream (KENALOG) 0.1 % Apply 1 application topically 2 (two) times daily.    VITAMIN D, CHOLECALCIFEROL, PO Take 1,000 Int'l Units by mouth daily.    [DISCONTINUED] fluconazole (DIFLUCAN) 150 MG tablet Take 1 tablet by mouth once and repeat in 3 days if symptoms persist (Patient not taking: Reported on 10/15/2021)    [DISCONTINUED] nitrofurantoin, macrocrystal-monohydrate, (MACROBID) 100 MG capsule Take 1 cap twice per day for 10 days. (Patient not taking: Reported on 10/15/2021)    No facility-administered encounter medications on file as of 10/15/2021.    Surgical History: Past Surgical History:  Procedure Laterality Date   COLONOSCOPY     COLONOSCOPY WITH PROPOFOL N/A 01/31/2016   Procedure: COLONOSCOPY WITH PROPOFOL;  Surgeon: Manya Silvas, MD;  Location: [facility] ENDOSCOPY;  Service: Endoscopy;  Laterality: N/A;   ESOPHAGOGASTRODUODENOSCOPY     tooth implant     started in 2020 but finished dec 2021    Medical History: Past Medical History:  Diagnosis Date   Arthritis    Headache    History of chickenpox    Hypertension    Sleep apnea     Family History: Family History  Problem Relation Age of Onset  Breast cancer Mother 48    Social History: Social History   Socioeconomic History   Marital status: Married    Spouse name: Not on file   Number of children: Not on file   Years of education: Not on file   Highest education level: Not on file  Occupational History   Not on file  Tobacco Use   Smoking status: Never   Smokeless tobacco: Never  Substance and Sexual Activity   Alcohol use: Yes    Comment: rarely   Drug use: No   Sexual activity: Not on file  Other Topics Concern   Not on  file  Social History Narrative   Not on file   Social Determinants of Health   Financial Resource Strain: Not on file  Food Insecurity: Not on file  Transportation Needs: Not on file  Physical Activity: Not on file  Stress: Not on file  Social Connections: Not on file  Intimate Partner Violence: Not on file    Vital Signs: Blood pressure 140/84, pulse 83, temperature 98 F (36.7 C), resp. rate 16, height 5\' 3"  (1.6 m), weight 225 lb 9.6 oz (102.3 kg), SpO2 94 %.  Examination: General Appearance: The patient is well-developed, well-nourished, and in no distress. Skin: Gross inspection of skin unremarkable. Head: normocephalic, no gross deformities. Eyes: no gross deformities noted. ENT: ears appear grossly normal no exudates. Neck: Supple. No thyromegaly. No LAD. Respiratory: no rhonchi noted. Cardiovascular: Normal S1 and S2 without murmur or rub. Extremities: No cyanosis. pulses are equal. Neurologic: Alert and oriented. No involuntary movements.  LABS: No results found for this or any previous visit (from the past 2160 hour(s)).  Radiology: MM 3D SCREEN BREAST BILATERAL  Result Date: 06/04/2021 CLINICAL DATA:  Screening. EXAM: DIGITAL SCREENING BILATERAL MAMMOGRAM WITH TOMOSYNTHESIS AND CAD TECHNIQUE: Bilateral screening digital craniocaudal and mediolateral oblique mammograms were obtained. Bilateral screening digital breast tomosynthesis was performed. The images were evaluated with computer-aided detection. COMPARISON:  Previous exam(s). ACR Breast Density Category c: The breast tissue is heterogeneously dense, which may obscure small masses. FINDINGS: There are no findings suspicious for malignancy. IMPRESSION: No mammographic evidence of malignancy. A result letter of this screening mammogram will be mailed directly to the patient. RECOMMENDATION: Screening mammogram in one year. (Code:SM-B-01Y) BI-RADS CATEGORY  1: Negative. Electronically Signed   By: Fidela Salisbury  M.D.   On: 06/04/2021 17:12   No results found.  No results found.    Assessment and Plan: Patient Active Problem List   Diagnosis Date Noted   Encounter for screening mammogram for malignant neoplasm of breast 04/24/2020   OSA on CPAP 01/07/2020   Urinary incontinence 04/25/2019   Need for vaccination against Streptococcus pneumoniae using pneumococcal conjugate vaccine 13 06/30/2018   Rash 04/14/2018   Vaginal candidiasis 04/14/2018   Stomatitis and mucositis 04/14/2018   Other atopic dermatitis 04/14/2018   Nausea 04/14/2018   Encounter for general adult medical examination with abnormal findings 12/29/2017   Essential hypertension 12/29/2017   Mixed hyperlipidemia 12/29/2017   Dysuria 12/29/2017   Non-seasonal allergic rhinitis due to pollen 12/29/2017   Unspecified menopausal and perimenopausal disorder 12/29/2017   Urinary tract infection with hematuria 12/29/2017   Neoplasm of uncertain behavior of skin of lower extremity 12/29/2017   Plantar fasciitis 09/11/2015   Paronychia 08/25/2014    1. OSA (obstructive sleep apnea) On CPPA will continue with current pressures. She has been tolerating well CPAP Counseling: had a lengthy discussion with the patient regarding the importance  of PAP therapy in management of the sleep apnea. Patient appears to understand the risk factor reduction and also understands the risks associated with untreated sleep apnea. Patient will try to make a good faith effort to remain compliant with therapy. Also instructed the patient on proper cleaning of the device including the water must be changed daily if possible and use of distilled water is preferred. Patient understands that the machine should be regularly cleaned with appropriate recommended cleaning solutions that do not damage the PAP machine for example given white vinegar and water rinses. Other methods such as ozone treatment may not be as good as these simple methods to achieve  cleaning.   2. Obesity, morbid ([place]) Obesity Counseling: Had a lengthy discussion regarding patients BMI and weight issues. Patient was instructed on portion control as well as increased activity. Also discussed caloric restrictions with trying to maintain intake less than 2000 Kcal. Discussions were made in accordance with the 5As of weight management. Simple actions such as not eating late and if able to, taking a walk is suggested.   3. Non-seasonal allergic rhinitis due to pollen Under control as needed nasal spray antihistamines   General Counseling: I have discussed the findings of the evaluation and examination with Aura.  I have also discussed any further diagnostic evaluation thatmay be needed or ordered today. Arilynn verbalizes understanding of the findings of todays visit. We also reviewed her medications today and discussed drug interactions and side effects including but not limited excessive drowsiness and altered mental states. We also discussed that there is always a risk not just to her but also people around her. she has been encouraged to call the office with any questions or concerns that should arise related to todays visit.  No orders of the defined types were placed in this encounter.    Time spent: 48  I have personally obtained a history, examined the patient, evaluated laboratory and imaging results, formulated the assessment and plan and placed orders.    Allyne Gee, MD [facility] Pulmonary and Critical Care Sleep medicine

## 2021-10-17 ENCOUNTER — Other Ambulatory Visit: Payer: Self-pay

## 2021-10-17 DIAGNOSIS — R0602 Shortness of breath: Secondary | ICD-10-CM

## 2021-10-18 ENCOUNTER — Encounter: Payer: Self-pay | Admitting: Physician Assistant

## 2021-10-18 ENCOUNTER — Ambulatory Visit (INDEPENDENT_AMBULATORY_CARE_PROVIDER_SITE_OTHER): Payer: Medicare HMO | Admitting: Physician Assistant

## 2021-10-18 ENCOUNTER — Other Ambulatory Visit: Payer: Self-pay

## 2021-10-18 VITALS — BP 119/78 | HR 77 | Temp 98.5°F | Resp 16 | Ht 63.0 in | Wt 226.6 lb

## 2021-10-18 DIAGNOSIS — Z6841 Body Mass Index (BMI) 40.0 and over, adult: Secondary | ICD-10-CM | POA: Diagnosis not present

## 2021-10-18 DIAGNOSIS — I10 Essential (primary) hypertension: Secondary | ICD-10-CM

## 2021-10-18 DIAGNOSIS — E782 Mixed hyperlipidemia: Secondary | ICD-10-CM | POA: Diagnosis not present

## 2021-10-18 DIAGNOSIS — J301 Allergic rhinitis due to pollen: Secondary | ICD-10-CM

## 2021-10-18 NOTE — Progress Notes (Signed)
[facility] [place], [place] [address]  Internal MEDICINE  Office Visit Note  Patient Name: Ashley Mckay  [id number]  [id number]  Date of Service: 10/18/2021  Chief Complaint  Patient presents with   Follow-up   Hypertension    HPI Pt is here for routine follow up -starting nasal spray for some postnasal drip and this is helping -Had eye exam on Feb 1st and said it went well -Has not started topamax because afraid of side effects and was worried that she wouldn't stop it if side effects occurred. Discussed that we are starting low dose and that if any side effects it can be stopped without any tapering at this dose. She feels more comfortable now and is going to start. -Going to the gym now to exercise a few days per week, limited some by knee pain and has been putting off knee replacements for awhile -had her cpap follow up a few days ago and went well -taking melatonin and seems to be helping  Current Medication: Outpatient Encounter Medications as of 10/18/2021  Medication Sig Note   aspirin EC 81 MG tablet Take 81 mg by mouth daily. Swallow whole.    Docusate Calcium (STOOL SOFTENER PO) Take by mouth.    ibuprofen (ADVIL,MOTRIN) 800 MG tablet 800 mg every 8 (eight) hours as needed.  06/21/2014: Received from: External Pharmacy   levocetirizine (XYZAL) 5 MG tablet Take 1 tablet (5 mg total) by mouth every evening.    Magnesium 250 MG TABS Take by mouth.    ondansetron (ZOFRAN) 4 MG tablet Take 1 tablet (4 mg total) by mouth every 8 (eight) hours as needed for nausea or vomiting.    oxybutynin (DITROPAN-XL) 10 MG 24 hr tablet Take 1 tablet (10 mg total) by mouth at bedtime.    propranolol ER (INDERAL LA) 80 MG 24 hr capsule Take 1 capsule (80 mg total) by mouth daily.    rosuvastatin (CRESTOR) 10 MG tablet Take 1 tablet (10 mg total) by mouth daily.    topiramate (TOPAMAX) 25 MG tablet Take 1 tablet (25 mg total) by mouth daily.    triamcinolone cream  (KENALOG) 0.1 % Apply 1 application topically 2 (two) times daily.    VITAMIN D, CHOLECALCIFEROL, PO Take 1,000 Int'l Units by mouth daily.    No facility-administered encounter medications on file as of 10/18/2021.    Surgical History: Past Surgical History:  Procedure Laterality Date   COLONOSCOPY     COLONOSCOPY WITH PROPOFOL N/A 01/31/2016   Procedure: COLONOSCOPY WITH PROPOFOL;  Surgeon: Manya Silvas, MD;  [facility]: [facility] ENDOSCOPY;  Service: Endoscopy;  Laterality: N/A;   ESOPHAGOGASTRODUODENOSCOPY     tooth implant     started in 2020 but finished dec 2021    Medical History: Past Medical History:  Diagnosis Date   Arthritis    Headache    History of chickenpox    Hypertension    Sleep apnea     Family History: Family History  Problem Relation Age of Onset   Breast cancer Mother 85    Social History   Socioeconomic History   Marital status: Married    Spouse name: Not on file   Number of children: Not on file   Years of education: Not on file   Highest education level: Not on file  Occupational History   Not on file  Tobacco Use   Smoking status: Never   Smokeless tobacco: Never  Substance and Sexual Activity  Alcohol use: Yes    Comment: rarely   Drug use: No   Sexual activity: Not on file  Other Topics Concern   Not on file  Social History Narrative   Not on file   Social Determinants of Health   Financial Resource Strain: Not on file  Food Insecurity: Not on file  Transportation Needs: Not on file  Physical Activity: Not on file  Stress: Not on file  Social Connections: Not on file  Intimate Partner Violence: Not on file      Review of Systems  Constitutional:  Negative for chills, fatigue and unexpected weight change.  HENT:  Positive for postnasal drip. Negative for congestion, rhinorrhea, sneezing and sore throat.   Eyes:  Negative for redness.  Respiratory:  Negative for cough, chest tightness and shortness of breath.    Cardiovascular:  Negative for chest pain and palpitations.  Gastrointestinal:  Negative for abdominal pain, constipation, diarrhea, nausea and vomiting.  Genitourinary:  Negative for dysuria and frequency.  Musculoskeletal:  Negative for arthralgias, back pain, joint swelling and neck pain.  Skin:  Negative for rash.  Neurological: Negative.  Negative for tremors and numbness.  Hematological:  Negative for adenopathy. Does not bruise/bleed easily.  Psychiatric/Behavioral:  Negative for behavioral problems (Depression), sleep disturbance and suicidal ideas. The patient is not nervous/anxious.    Vital Signs: BP 119/78    Pulse 77    Temp 98.5 F (36.9 C)    Resp 16    Ht 5\' 3"  (1.6 m)    Wt 226 lb 9.6 oz (102.8 kg)    LMP  (LMP Unknown)    SpO2 94%    BMI 40.14 kg/m    Physical Exam Vitals and nursing note reviewed.  Constitutional:      General: She is not in acute distress.    Appearance: She is well-developed. She is obese. She is not diaphoretic.  HENT:     Head: Normocephalic and atraumatic.     Mouth/Throat:     Pharynx: No oropharyngeal exudate.  Eyes:     Pupils: Pupils are equal, round, and reactive to light.  Neck:     Thyroid: No thyromegaly.     Vascular: No JVD.     Trachea: No tracheal deviation.  Cardiovascular:     Rate and Rhythm: Normal rate and regular rhythm.     Heart sounds: Normal heart sounds. No murmur heard.   No friction rub. No gallop.  Pulmonary:     Effort: Pulmonary effort is normal. No respiratory distress.     Breath sounds: No wheezing or rales.  Chest:     Chest wall: No tenderness.  Breasts:    Right: Normal. No mass.     Left: Normal. No mass.  Abdominal:     General: Bowel sounds are normal.     Palpations: Abdomen is soft.  Musculoskeletal:        General: Normal range of motion.     Cervical back: Normal range of motion and neck supple.  Lymphadenopathy:     Cervical: No cervical adenopathy.  Skin:    General: Skin is warm  and dry.  Neurological:     Mental Status: She is alert and oriented to person, place, and time.     Cranial Nerves: No cranial nerve deficit.  Psychiatric:        Behavior: Behavior normal.        Thought Content: Thought content normal.  Judgment: Judgment normal.       Assessment/Plan: 1. Essential hypertension Well controlled, continue current medications  2. Non-seasonal allergic rhinitis due to pollen Continue nasal spray and antihistamine  3. Mixed hyperlipidemia Continue crestor  4. Morbid obesity with BMI of 40.0-44.9, adult ([place]) Will try starting topamax now and will continue to work on diet and exercise   General Counseling: Raygan verbalizes understanding of the findings of todays visit and agrees with plan of treatment. I have discussed any further diagnostic evaluation that may be needed or ordered today. We also reviewed her medications today. she has been encouraged to call the office with any questions or concerns that should arise related to todays visit.    No orders of the defined types were placed in this encounter.   No orders of the defined types were placed in this encounter.   This patient was seen by Drema Dallas, PA-C in collaboration with Dr. Clayborn Bigness as a part of collaborative care agreement.   Total time spent:30 Minutes Time spent includes review of chart, medications, test results, and follow up plan with the patient.      Dr Lavera Guise Internal medicine

## 2021-10-31 ENCOUNTER — Other Ambulatory Visit: Payer: Self-pay

## 2021-10-31 ENCOUNTER — Ambulatory Visit (INDEPENDENT_AMBULATORY_CARE_PROVIDER_SITE_OTHER): Payer: Medicare HMO | Admitting: Internal Medicine

## 2021-10-31 DIAGNOSIS — R0602 Shortness of breath: Secondary | ICD-10-CM

## 2021-11-01 DIAGNOSIS — G4733 Obstructive sleep apnea (adult) (pediatric): Secondary | ICD-10-CM | POA: Diagnosis not present

## 2021-11-13 NOTE — Procedures (Signed)
[facility] [place], [address]    Complete Pulmonary Function Testing Interpretation:  FINDINGS:  The forced vital capacity is normal FEV1 is normal.-FVC ratio was decreased.  Postbronchodilator no significant change in FEV1 clinical improvement may still occur in the absence of spirometric improvement.  Total lung capacity is normal residual volume is normal residual volume to lung capacity ratio was normal.  FRC was decreased.  DLCO was normal  IMPRESSION:  Pulmonary function study was within normal limits clinical correlation is recommended.  Allyne Gee, MD [facility] Pulmonary Critical Care Medicine Sleep Medicine

## 2021-11-14 ENCOUNTER — Encounter: Payer: Self-pay | Admitting: Internal Medicine

## 2021-11-14 LAB — PULMONARY FUNCTION TEST

## 2021-11-28 DIAGNOSIS — H36 Retinal disorders in diseases classified elsewhere: Secondary | ICD-10-CM | POA: Diagnosis not present

## 2021-11-28 DIAGNOSIS — H524 Presbyopia: Secondary | ICD-10-CM | POA: Diagnosis not present

## 2021-11-28 DIAGNOSIS — H52223 Regular astigmatism, bilateral: Secondary | ICD-10-CM | POA: Diagnosis not present

## 2021-11-28 DIAGNOSIS — H31093 Other chorioretinal scars, bilateral: Secondary | ICD-10-CM | POA: Diagnosis not present

## 2021-11-28 DIAGNOSIS — H5203 Hypermetropia, bilateral: Secondary | ICD-10-CM | POA: Diagnosis not present

## 2021-12-06 ENCOUNTER — Encounter: Payer: Self-pay | Admitting: Physician Assistant

## 2021-12-06 ENCOUNTER — Ambulatory Visit (INDEPENDENT_AMBULATORY_CARE_PROVIDER_SITE_OTHER): Payer: Medicare HMO | Admitting: Physician Assistant

## 2021-12-06 VITALS — BP 111/88 | HR 83 | Temp 97.7°F | Resp 16 | Ht 63.0 in | Wt 227.4 lb

## 2021-12-06 DIAGNOSIS — J301 Allergic rhinitis due to pollen: Secondary | ICD-10-CM | POA: Diagnosis not present

## 2021-12-06 DIAGNOSIS — E782 Mixed hyperlipidemia: Secondary | ICD-10-CM | POA: Diagnosis not present

## 2021-12-06 DIAGNOSIS — Z6841 Body Mass Index (BMI) 40.0 and over, adult: Secondary | ICD-10-CM

## 2021-12-06 DIAGNOSIS — G4733 Obstructive sleep apnea (adult) (pediatric): Secondary | ICD-10-CM | POA: Diagnosis not present

## 2021-12-06 DIAGNOSIS — I10 Essential (primary) hypertension: Secondary | ICD-10-CM

## 2021-12-06 NOTE — Progress Notes (Signed)
[facility] ?[address] ?[place], [place] [address] ? ?Internal MEDICINE  ?Office Visit Note ? ?Patient Name: Ashley Mckay ? [id number]  ?[id number] ? ?Date of Service: 12/11/2021 ? ?Chief Complaint  ?Patient presents with  ? Follow-up  ?  Review PFT  ? ? ?HPI ?Pt is here for routine follow up ?-Had some allergies flare with weather changes and used her flonase and it helped ?-Eyes checked in Feb and doing well with new glasses ?-Had tried topamax for 1-2 weeks and had a headache so stopped but thinks it was the weather changes and will retry ?-melatonin is helping her sleep, wearing cpap nightly ?-Pft reviewed and was normal ?-will get shingles vaccines ? ?Current Medication: ?Outpatient Encounter Medications as of 12/06/2021  ?Medication Sig Note  ? aspirin EC 81 MG tablet Take 81 mg by mouth daily. Swallow whole.   ? Docusate Calcium (STOOL SOFTENER PO) Take by mouth.   ? ibuprofen (ADVIL,MOTRIN) 800 MG tablet 800 mg every 8 (eight) hours as needed.  06/21/2014: Received from: External Pharmacy  ? levocetirizine (XYZAL) 5 MG tablet Take 1 tablet (5 mg total) by mouth every evening.   ? Magnesium 250 MG TABS Take by mouth.   ? ondansetron (ZOFRAN) 4 MG tablet Take 1 tablet (4 mg total) by mouth every 8 (eight) hours as needed for nausea or vomiting.   ? oxybutynin (DITROPAN-XL) 10 MG 24 hr tablet Take 1 tablet (10 mg total) by mouth at bedtime.   ? propranolol ER (INDERAL LA) 80 MG 24 hr capsule Take 1 capsule (80 mg total) by mouth daily.   ? rosuvastatin (CRESTOR) 10 MG tablet Take 1 tablet (10 mg total) by mouth daily.   ? topiramate (TOPAMAX) 25 MG tablet Take 1 tablet (25 mg total) by mouth daily.   ? triamcinolone cream (KENALOG) 0.1 % Apply 1 application topically 2 (two) times daily.   ? VITAMIN D, CHOLECALCIFEROL, PO Take 1,000 Int'l Units by mouth daily.   ? ?No facility-administered encounter medications on file as of 12/06/2021.  ? ? ?Surgical History: ?Past Surgical History:  ?Procedure  Laterality Date  ? COLONOSCOPY    ? COLONOSCOPY WITH PROPOFOL N/A 01/31/2016  ? Procedure: COLONOSCOPY WITH PROPOFOL;  Surgeon: Manya Silvas, MD;  Location: [facility];  Service: Endoscopy;  Laterality: N/A;  ? ESOPHAGOGASTRODUODENOSCOPY    ? tooth implant    ? started in 2020 but finished dec 2021  ? ? ?Medical History: ?Past Medical History:  ?Diagnosis Date  ? Arthritis   ? Headache   ? History of chickenpox   ? Hypertension   ? Sleep apnea   ? ? ?Family History: ?Family History  ?Problem Relation Age of Onset  ? Breast cancer Mother 27  ? ? ?Social History  ? ?Socioeconomic History  ? Marital status: Married  ?  Spouse name: Not on file  ? Number of children: Not on file  ? Years of education: Not on file  ? Highest education level: Not on file  ?Occupational History  ? Not on file  ?Tobacco Use  ? Smoking status: Never  ? Smokeless tobacco: Never  ?Substance and Sexual Activity  ? Alcohol use: Yes  ?  Comment: rarely  ? Drug use: No  ? Sexual activity: Not on file  ?Other Topics Concern  ? Not on file  ?Social History Narrative  ? Not on file  ? ?Social Determinants of Health  ? ?Financial Resource Strain: Not on file  ?Food  Insecurity: Not on file  ?Transportation Needs: Not on file  ?Physical Activity: Not on file  ?Stress: Not on file  ?Social Connections: Not on file  ?Intimate Partner Violence: Not on file  ? ? ? ? ?Review of Systems  ?Constitutional:  Negative for chills, fatigue and unexpected weight change.  ?HENT:  Positive for postnasal drip. Negative for congestion, rhinorrhea, sneezing and sore throat.   ?Eyes:  Negative for redness.  ?Respiratory:  Negative for cough, chest tightness and shortness of breath.   ?Cardiovascular:  Negative for chest pain and palpitations.  ?Gastrointestinal:  Negative for abdominal pain, constipation, diarrhea, nausea and vomiting.  ?Genitourinary:  Negative for dysuria and frequency.  ?Musculoskeletal:  Negative for arthralgias, back pain, joint swelling and  neck pain.  ?Skin:  Negative for rash.  ?Neurological: Negative.  Negative for tremors and numbness.  ?Hematological:  Negative for adenopathy. Does not bruise/bleed easily.  ?Psychiatric/Behavioral:  Negative for behavioral problems (Depression), sleep disturbance and suicidal ideas. The patient is not nervous/anxious.   ? ?Vital Signs: ?BP 111/88   Pulse 83   Temp 97.7 ?F (36.5 ?C)   Resp 16   Ht '5\' 3"'$  (1.6 m)   Wt 227 lb 6.4 oz (103.1 kg)   LMP  (LMP Unknown)   SpO2 95%   BMI 40.28 kg/m?  ? ? ?Physical Exam ?Vitals and nursing note reviewed.  ?Constitutional:   ?   General: She is not in acute distress. ?   Appearance: She is well-developed. She is obese. She is not diaphoretic.  ?HENT:  ?   Head: Normocephalic and atraumatic.  ?   Mouth/Throat:  ?   Pharynx: No oropharyngeal exudate.  ?Eyes:  ?   Pupils: Pupils are equal, round, and reactive to light.  ?Neck:  ?   Thyroid: No thyromegaly.  ?   Vascular: No JVD.  ?   Trachea: No tracheal deviation.  ?Cardiovascular:  ?   Rate and Rhythm: Normal rate and regular rhythm.  ?   Heart sounds: Normal heart sounds. No murmur heard. ?  No friction rub. No gallop.  ?Pulmonary:  ?   Effort: Pulmonary effort is normal. No respiratory distress.  ?   Breath sounds: No wheezing or rales.  ?Chest:  ?   Chest wall: No tenderness.  ?Breasts: ?   Right: Normal. No mass.  ?   Left: Normal. No mass.  ?Abdominal:  ?   General: Bowel sounds are normal.  ?   Palpations: Abdomen is soft.  ?Musculoskeletal:     ?   General: Normal range of motion.  ?   Cervical back: Normal range of motion and neck supple.  ?Lymphadenopathy:  ?   Cervical: No cervical adenopathy.  ?Skin: ?   General: Skin is warm and dry.  ?Neurological:  ?   Mental Status: She is alert and oriented to person, place, and time.  ?   Cranial Nerves: No cranial nerve deficit.  ?Psychiatric:     ?   Behavior: Behavior normal.     ?   Thought Content: Thought content normal.     ?   Judgment: Judgment normal.   ? ? ? ? ? ?Assessment/Plan: ?1. Essential hypertension ?Stable, continue current medication ? ?2. OSA (obstructive sleep apnea) ?Continue CPAP ? ?3. Mixed hyperlipidemia ?Continue Crestor as before ? ?4. Non-seasonal allergic rhinitis due to pollen ?Continue Xyzal as well as Flonase to help with allergies.  May try Mucinex if starting to feel congested ? ?5.  Morbid obesity with BMI of 40.0-44.9, adult ([place]) ?We will retry Topamax as well as work on diet and exercise to help with weight loss ? ? ?General Counseling: Shimeka verbalizes understanding of the findings of todays visit and agrees with plan of treatment. I have discussed any further diagnostic evaluation that may be needed or ordered today. We also reviewed her medications today. she has been encouraged to call the office with any questions or concerns that should arise related to todays visit. ? ? ? ?No orders of the defined types were placed in this encounter. ? ? ?No orders of the defined types were placed in this encounter. ? ? ?This patient was seen by Drema Dallas, PA-C in collaboration with Dr. Clayborn Bigness as a part of collaborative care agreement. ? ? ?Total time spent:30 Minutes ?Time spent includes review of chart, medications, test results, and follow up plan with the patient.  ? ? ? ? ?Dr Lavera Guise ?Internal medicine  ?

## 2021-12-13 ENCOUNTER — Ambulatory Visit: Payer: Medicare HMO | Admitting: Physician Assistant

## 2022-01-16 ENCOUNTER — Other Ambulatory Visit: Payer: Self-pay | Admitting: Physician Assistant

## 2022-01-16 DIAGNOSIS — R32 Unspecified urinary incontinence: Secondary | ICD-10-CM

## 2022-02-07 ENCOUNTER — Encounter: Payer: Self-pay | Admitting: Physician Assistant

## 2022-02-07 ENCOUNTER — Ambulatory Visit (INDEPENDENT_AMBULATORY_CARE_PROVIDER_SITE_OTHER): Payer: Medicare HMO | Admitting: Physician Assistant

## 2022-02-07 DIAGNOSIS — I10 Essential (primary) hypertension: Secondary | ICD-10-CM | POA: Diagnosis not present

## 2022-02-07 DIAGNOSIS — Z6841 Body Mass Index (BMI) 40.0 and over, adult: Secondary | ICD-10-CM | POA: Diagnosis not present

## 2022-02-07 DIAGNOSIS — R7989 Other specified abnormal findings of blood chemistry: Secondary | ICD-10-CM

## 2022-02-07 DIAGNOSIS — E538 Deficiency of other specified B group vitamins: Secondary | ICD-10-CM

## 2022-02-07 DIAGNOSIS — E782 Mixed hyperlipidemia: Secondary | ICD-10-CM

## 2022-02-07 DIAGNOSIS — R5383 Other fatigue: Secondary | ICD-10-CM | POA: Diagnosis not present

## 2022-02-07 DIAGNOSIS — E559 Vitamin D deficiency, unspecified: Secondary | ICD-10-CM | POA: Diagnosis not present

## 2022-02-07 NOTE — Progress Notes (Signed)
[facility] [place], [place] [address]  Internal MEDICINE  Office Visit Note  Patient Name: Ashley Mckay  [id number]  [id number]  Date of Service: 02/12/2022  Chief Complaint  Patient presents with   Follow-up    Weight management    Hypertension    HPI Pt is here for routine follow up -Has been on topamax for 3 weeks and is tolerating well. Has lost 1 lb since last visit. Doesn't feel like she is wanting to eat as much and therefore would like to continue with current dose -Will continue to work on diet and exercise along with this -Due for routine fasting labs  Current Medication: Outpatient Encounter Medications as of 02/07/2022  Medication Sig Note   aspirin EC 81 MG tablet Take 81 mg by mouth daily. Swallow whole.    Docusate Calcium (STOOL SOFTENER PO) Take by mouth.    ibuprofen (ADVIL,MOTRIN) 800 MG tablet 800 mg every 8 (eight) hours as needed.  06/21/2014: Received from: External Pharmacy   levocetirizine (XYZAL) 5 MG tablet Take 1 tablet (5 mg total) by mouth every evening.    Magnesium 250 MG TABS Take by mouth.    ondansetron (ZOFRAN) 4 MG tablet Take 1 tablet (4 mg total) by mouth every 8 (eight) hours as needed for nausea or vomiting.    oxybutynin (DITROPAN-XL) 10 MG 24 hr tablet TAKE ONE TABLET BY MOUTH DAILY AT BEDTIME    propranolol ER (INDERAL LA) 80 MG 24 hr capsule Take 1 capsule (80 mg total) by mouth daily.    rosuvastatin (CRESTOR) 10 MG tablet Take 1 tablet (10 mg total) by mouth daily.    topiramate (TOPAMAX) 25 MG tablet Take 1 tablet (25 mg total) by mouth daily.    triamcinolone cream (KENALOG) 0.1 % Apply 1 application topically 2 (two) times daily.    VITAMIN D, CHOLECALCIFEROL, PO Take 1,000 Int'l Units by mouth daily.    No facility-administered encounter medications on file as of 02/07/2022.    Surgical History: Past Surgical History:  Procedure Laterality Date   COLONOSCOPY     COLONOSCOPY WITH PROPOFOL N/A  01/31/2016   Procedure: COLONOSCOPY WITH PROPOFOL;  Surgeon: Manya Silvas, MD;  Location: [facility] ENDOSCOPY;  Service: Endoscopy;  Laterality: N/A;   ESOPHAGOGASTRODUODENOSCOPY     tooth implant     started in 2020 but finished dec 2021    Medical History: Past Medical History:  Diagnosis Date   Arthritis    Headache    History of chickenpox    Hypertension    Sleep apnea     Family History: Family History  Problem Relation Age of Onset   Breast cancer Mother 67    Social History   Socioeconomic History   Marital status: Married    Spouse name: Not on file   Number of children: Not on file   Years of education: Not on file   Highest education level: Not on file  Occupational History   Not on file  Tobacco Use   Smoking status: Never   Smokeless tobacco: Never  Substance and Sexual Activity   Alcohol use: Yes    Comment: rarely   Drug use: No   Sexual activity: Not on file  Other Topics Concern   Not on file  Social History Narrative   Not on file   Social Determinants of Health   Financial Resource Strain: Not on file  Food Insecurity: Not on file  Transportation Needs: Not  on file  Physical Activity: Not on file  Stress: Not on file  Social Connections: Not on file  Intimate Partner Violence: Not on file      Review of Systems  Constitutional:  Negative for chills, fatigue and unexpected weight change.  HENT:  Positive for postnasal drip. Negative for congestion, rhinorrhea, sneezing and sore throat.   Eyes:  Negative for redness.  Respiratory:  Negative for cough, chest tightness and shortness of breath.   Cardiovascular:  Negative for chest pain and palpitations.  Gastrointestinal:  Negative for abdominal pain, constipation, diarrhea, nausea and vomiting.  Genitourinary:  Negative for dysuria and frequency.  Musculoskeletal:  Negative for arthralgias, back pain, joint swelling and neck pain.  Skin:  Negative for rash.  Neurological: Negative.   Negative for tremors and numbness.  Hematological:  Negative for adenopathy. Does not bruise/bleed easily.  Psychiatric/Behavioral:  Negative for behavioral problems (Depression), sleep disturbance and suicidal ideas. The patient is not nervous/anxious.    Vital Signs: BP 118/69   Pulse 82   Temp 97.8 F (36.6 C)   Resp 16   Ht '5\' 2"'$  (1.575 m)   Wt 226 lb (102.5 kg)   LMP  (LMP Unknown)   SpO2 96%   BMI 41.34 kg/m    Physical Exam Vitals and nursing note reviewed.  Constitutional:      General: She is not in acute distress.    Appearance: She is well-developed. She is obese. She is not diaphoretic.  HENT:     Head: Normocephalic and atraumatic.     Mouth/Throat:     Pharynx: No oropharyngeal exudate.  Eyes:     Pupils: Pupils are equal, round, and reactive to light.  Neck:     Thyroid: No thyromegaly.     Vascular: No JVD.     Trachea: No tracheal deviation.  Cardiovascular:     Rate and Rhythm: Normal rate and regular rhythm.     Heart sounds: Normal heart sounds. No murmur heard.   No friction rub. No gallop.  Pulmonary:     Effort: Pulmonary effort is normal. No respiratory distress.     Breath sounds: No wheezing or rales.  Chest:     Chest wall: No tenderness.  Breasts:    Right: Normal. No mass.     Left: Normal. No mass.  Abdominal:     General: Bowel sounds are normal.     Palpations: Abdomen is soft.  Musculoskeletal:        General: Normal range of motion.     Cervical back: Normal range of motion and neck supple.  Lymphadenopathy:     Cervical: No cervical adenopathy.  Skin:    General: Skin is warm and dry.  Neurological:     Mental Status: She is alert and oriented to person, place, and time.     Cranial Nerves: No cranial nerve deficit.  Psychiatric:        Behavior: Behavior normal.        Thought Content: Thought content normal.        Judgment: Judgment normal.       Assessment/Plan: 1. Essential hypertension Stable, continue  current medication  2. Mixed hyperlipidemia Continue crestor and will update labs - Lipid Panel With LDL/HDL Ratio  3. Vitamin D deficiency - VITAMIN D 25 Hydroxy (Vit-D Deficiency, Fractures)  4. B12 deficiency - B12 and Folate Panel  5. Abnormal thyroid blood test - TSH + free T4  6. Other fatigue - CBC  w/Diff/Platelet - Comprehensive metabolic panel - TSH + free T4 - Lipid Panel With LDL/HDL Ratio - VITAMIN D 25 Hydroxy (Vit-D Deficiency, Fractures) - B12 and Folate Panel  7. Morbid obesity with BMI of 40.0-44.9, adult [facility]) May continue topamax as before and continue to work on diet and exercise   General Counseling: Eternity verbalizes understanding of the findings of todays visit and agrees with plan of treatment. I have discussed any further diagnostic evaluation that may be needed or ordered today. We also reviewed her medications today. she has been encouraged to call the office with any questions or concerns that should arise related to todays visit.    Orders Placed This Encounter  Procedures   CBC w/Diff/Platelet   Comprehensive metabolic panel   TSH + free T4   Lipid Panel With LDL/HDL Ratio   VITAMIN D 25 Hydroxy (Vit-D Deficiency, Fractures)   B12 and Folate Panel    No orders of the defined types were placed in this encounter.   This patient was seen by Drema Dallas, PA-C in collaboration with Dr. Clayborn Bigness as a part of collaborative care agreement.   Total time spent:30 Minutes Time spent includes review of chart, medications, test results, and follow up plan with the patient.      Dr Lavera Guise Internal medicine

## 2022-02-15 DIAGNOSIS — G4733 Obstructive sleep apnea (adult) (pediatric): Secondary | ICD-10-CM | POA: Diagnosis not present

## 2022-02-19 ENCOUNTER — Telehealth (INDEPENDENT_AMBULATORY_CARE_PROVIDER_SITE_OTHER): Payer: Medicare HMO | Admitting: Nurse Practitioner

## 2022-02-19 ENCOUNTER — Encounter: Payer: Self-pay | Admitting: Nurse Practitioner

## 2022-02-19 VITALS — BP 117/87 | Temp 97.7°F | Resp 16 | Ht 62.0 in | Wt 222.2 lb

## 2022-02-19 DIAGNOSIS — J0110 Acute frontal sinusitis, unspecified: Secondary | ICD-10-CM

## 2022-02-19 DIAGNOSIS — R051 Acute cough: Secondary | ICD-10-CM | POA: Diagnosis not present

## 2022-02-19 MED ORDER — DOXYCYCLINE HYCLATE 100 MG PO TABS: 100.0000 mg | ORAL_TABLET | Freq: Two times a day (BID) | ORAL | 0 refills | Status: DC

## 2022-02-19 MED ORDER — HYDROCOD POLI-CHLORPHE POLI ER 10-8 MG/5ML PO SUER: 5.0000 mL | Freq: Two times a day (BID) | ORAL | 0 refills | Status: DC | PRN

## 2022-02-19 NOTE — Progress Notes (Signed)
[facility] [place], [place] [address]  Internal MEDICINE  Telephone Visit  Patient Name: Ashley Mckay  [id number]  [id number]  Date of Service: 02/19/2022  I connected with the patient at 5:00 PM by telephone and verified the patients identity using two identifiers.   I discussed the limitations, risks, security and privacy concerns of performing an evaluation and management service by telephone and the availability of in person appointments. I also discussed with the patient that there may be a patient responsible charge related to the service.  The patient expressed understanding and agrees to proceed.    Chief Complaint  Patient presents with   Acute Visit    Drainage, congestion in head and now chest, difficulty falling asleep due to cough   Cough    Scratchy throat   Headache   Ear Pain    Both ears   Telephone Assessment    video   Telephone Screen    [phone]    HPI Ashley Mckay presents for a telehealth virtual visit for symptoms of sinusitis. She is having nasal congestion, cough, headache, chest congestion, chest tightness, difficulty sleeping due to cough, bilateral ear pain, sore throat and postnasal drip.    Current Medication: Outpatient Encounter Medications as of 02/19/2022  Medication Sig Note   aspirin EC 81 MG tablet Take 81 mg by mouth daily. Swallow whole.    chlorpheniramine-HYDROcodone (TUSSIONEX PENNKINETIC ER) 10-8 MG/5ML Take 5 mLs by mouth every 12 (twelve) hours as needed for cough.    Docusate Calcium (STOOL SOFTENER PO) Take by mouth.    doxycycline (VIBRA-TABS) 100 MG tablet Take 1 tablet (100 mg total) by mouth 2 (two) times daily.    ibuprofen (ADVIL,MOTRIN) 800 MG tablet 800 mg every 8 (eight) hours as needed.  06/21/2014: Received from: External Pharmacy   levocetirizine (XYZAL) 5 MG tablet Take 1 tablet (5 mg total) by mouth every evening.    Magnesium 250 MG TABS Take by mouth.    ondansetron (ZOFRAN) 4 MG tablet  Take 1 tablet (4 mg total) by mouth every 8 (eight) hours as needed for nausea or vomiting.    oxybutynin (DITROPAN-XL) 10 MG 24 hr tablet TAKE ONE TABLET BY MOUTH DAILY AT BEDTIME    propranolol ER (INDERAL LA) 80 MG 24 hr capsule Take 1 capsule (80 mg total) by mouth daily.    rosuvastatin (CRESTOR) 10 MG tablet Take 1 tablet (10 mg total) by mouth daily.    triamcinolone cream (KENALOG) 0.1 % Apply 1 application topically 2 (two) times daily.    VITAMIN D, CHOLECALCIFEROL, PO Take 1,000 Int'l Units by mouth daily.    [DISCONTINUED] topiramate (TOPAMAX) 25 MG tablet Take 1 tablet (25 mg total) by mouth daily.    No facility-administered encounter medications on file as of 02/19/2022.    Surgical History: Past Surgical History:  Procedure Laterality Date   COLONOSCOPY     COLONOSCOPY WITH PROPOFOL N/A 01/31/2016   Procedure: COLONOSCOPY WITH PROPOFOL;  Surgeon: Manya Silvas, MD;  Location: [facility] ENDOSCOPY;  Service: Endoscopy;  Laterality: N/A;   ESOPHAGOGASTRODUODENOSCOPY     tooth implant     started in 2020 but finished dec 2021    Medical History: Past Medical History:  Diagnosis Date   Arthritis    Headache    History of chickenpox    Hypertension    Sleep apnea     Family History: Family History  Problem Relation Age of Onset   Breast cancer  Mother 48    Social History   Socioeconomic History   Marital status: Married    Spouse name: Not on file   Number of children: Not on file   Years of education: Not on file   Highest education level: Not on file  Occupational History   Not on file  Tobacco Use   Smoking status: Never   Smokeless tobacco: Never  Substance and Sexual Activity   Alcohol use: Yes    Comment: rarely   Drug use: No   Sexual activity: Not on file  Other Topics Concern   Not on file  Social History Narrative   Not on file   Social Determinants of Health   Financial Resource Strain: Not on file  Food Insecurity: Not on file   Transportation Needs: Not on file  Physical Activity: Not on file  Stress: Not on file  Social Connections: Not on file  Intimate Partner Violence: Not on file      Review of Systems  Constitutional:  Positive for fatigue and fever. Negative for chills and unexpected weight change.  HENT:  Positive for congestion, ear pain, postnasal drip and rhinorrhea. Negative for sneezing and sore throat.   Eyes:  Negative for redness.  Respiratory:  Positive for cough. Negative for chest tightness, shortness of breath and wheezing.   Cardiovascular: Negative.  Negative for chest pain and palpitations.  Gastrointestinal:  Negative for abdominal pain, constipation, diarrhea, nausea and vomiting.  Genitourinary:  Negative for dysuria and frequency.  Musculoskeletal:  Negative for arthralgias, back pain, joint swelling and neck pain.  Skin:  Negative for rash.  Neurological:  Positive for headaches. Negative for tremors and numbness.  Hematological:  Negative for adenopathy. Does not bruise/bleed easily.  Psychiatric/Behavioral:  Negative for behavioral problems (Depression), sleep disturbance and suicidal ideas. The patient is not nervous/anxious.     Vital Signs: BP 117/87   Temp 97.7 F (36.5 C)   Resp 16   Ht '5\' 2"'$  (1.575 m)   Wt 222 lb 3.2 oz (100.8 kg)   LMP  (LMP Unknown)   BMI 40.64 kg/m    Observation/Objective: She is alert and oriented and engages in conversation appropriately. She does not appear to be in any acute distress over video call.     Assessment/Plan: 1. Acute non-recurrent frontal sinusitis Empiric antibiotic treatment prescribed.  - doxycycline (VIBRA-TABS) 100 MG tablet; Take 1 tablet (100 mg total) by mouth 2 (two) times daily.  Dispense: 20 tablet; Refill: 0  2. Acute cough Medication prescribed for symptomatic treatment of cough.  - chlorpheniramine-HYDROcodone (TUSSIONEX PENNKINETIC ER) 10-8 MG/5ML; Take 5 mLs by mouth every 12 (twelve) hours as needed  for cough.  Dispense: 140 mL; Refill: 0   General Counseling: Ashley Mckay verbalizes understanding of the findings of today's phone visit and agrees with plan of treatment. I have discussed any further diagnostic evaluation that may be needed or ordered today. We also reviewed her medications today. she has been encouraged to call the office with any questions or concerns that should arise related to todays visit.  Return if symptoms worsen or fail to improve.   No orders of the defined types were placed in this encounter.   Meds ordered this encounter  Medications   doxycycline (VIBRA-TABS) 100 MG tablet    Sig: Take 1 tablet (100 mg total) by mouth 2 (two) times daily.    Dispense:  20 tablet    Refill:  0   chlorpheniramine-HYDROcodone (TUSSIONEX PENNKINETIC ER)  10-8 MG/5ML    Sig: Take 5 mLs by mouth every 12 (twelve) hours as needed for cough.    Dispense:  140 mL    Refill:  0    Time spent:10 Minutes Time spent with patient included reviewing progress notes, labs, imaging studies, and discussing plan for follow up.  [place] Controlled Substance Database was reviewed by me for overdose risk score (ORS) if appropriate.  This patient was seen by Jonetta Osgood, FNP-C in collaboration with Dr. Clayborn Bigness as a part of collaborative care agreement.  Nitza Schmid R. Valetta Fuller, MSN, FNP-C Internal medicine

## 2022-02-28 ENCOUNTER — Encounter: Payer: Self-pay | Admitting: Physician Assistant

## 2022-02-28 ENCOUNTER — Ambulatory Visit (INDEPENDENT_AMBULATORY_CARE_PROVIDER_SITE_OTHER): Payer: Medicare HMO | Admitting: Physician Assistant

## 2022-02-28 VITALS — BP 146/70 | HR 72 | Temp 98.3°F | Resp 16 | Ht 62.0 in | Wt 222.0 lb

## 2022-02-28 DIAGNOSIS — J0110 Acute frontal sinusitis, unspecified: Secondary | ICD-10-CM

## 2022-02-28 DIAGNOSIS — R051 Acute cough: Secondary | ICD-10-CM | POA: Diagnosis not present

## 2022-02-28 NOTE — Progress Notes (Signed)
[facility] [place], [place] [address]  Internal MEDICINE  Office Visit Note  Patient Name: Ashley Mckay  [id number]  [id number]  Date of Service: 03/15/2022  Chief Complaint  Patient presents with   Acute Visit   Cough    Lingering after antibiotics finished      HPI Pt is here for a sick visit. -Was seen for virtual visit last Tuesday for sinusitis and was prescribed Doxycycline and tussionex -Has not yet completed ABX but still having some coughing and worried there may be some wheezing but more so just feels the drainage. -Not feeling SOB. Does have a little drainage still but the sinus headaches and pressure has cleared. -She states she knows that a cough can linger for awhile, but family urged her to be seen to ensure nothing further needed  Current Medication:  Outpatient Encounter Medications as of 02/28/2022  Medication Sig Note   aspirin EC 81 MG tablet Take 81 mg by mouth daily. Swallow whole.    chlorpheniramine-HYDROcodone (TUSSIONEX PENNKINETIC ER) 10-8 MG/5ML Take 5 mLs by mouth every 12 (twelve) hours as needed for cough.    Docusate Calcium (STOOL SOFTENER PO) Take by mouth.    doxycycline (VIBRA-TABS) 100 MG tablet Take 1 tablet (100 mg total) by mouth 2 (two) times daily.    ibuprofen (ADVIL,MOTRIN) 800 MG tablet 800 mg every 8 (eight) hours as needed.  06/21/2014: Received from: External Pharmacy   levocetirizine (XYZAL) 5 MG tablet Take 1 tablet (5 mg total) by mouth every evening.    Magnesium 250 MG TABS Take by mouth.    ondansetron (ZOFRAN) 4 MG tablet Take 1 tablet (4 mg total) by mouth every 8 (eight) hours as needed for nausea or vomiting.    oxybutynin (DITROPAN-XL) 10 MG 24 hr tablet TAKE ONE TABLET BY MOUTH DAILY AT BEDTIME    propranolol ER (INDERAL LA) 80 MG 24 hr capsule Take 1 capsule (80 mg total) by mouth daily.    rosuvastatin (CRESTOR) 10 MG tablet Take 1 tablet (10 mg total) by mouth daily.    triamcinolone cream  (KENALOG) 0.1 % Apply 1 application topically 2 (two) times daily.    VITAMIN D, CHOLECALCIFEROL, PO Take 1,000 Int'l Units by mouth daily.    [DISCONTINUED] topiramate (TOPAMAX) 25 MG tablet Take 1 tablet (25 mg total) by mouth daily.    No facility-administered encounter medications on file as of 02/28/2022.      Medical History: Past Medical History:  Diagnosis Date   Arthritis    Headache    History of chickenpox    Hypertension    Sleep apnea      Vital Signs: BP (!) 146/70   Pulse 72   Temp 98.3 F (36.8 C)   Resp 16   Ht '5\' 2"'$  (1.575 m)   Wt 222 lb (100.7 kg)   LMP  (LMP Unknown)   SpO2 94%   BMI 40.60 kg/m    Review of Systems  Constitutional:  Negative for fatigue and fever.  HENT:  Positive for postnasal drip. Negative for congestion, mouth sores and sinus pressure.   Respiratory:  Positive for cough and wheezing. Negative for shortness of breath.   Cardiovascular:  Negative for chest pain.  Genitourinary:  Negative for flank pain.  Neurological:  Negative for headaches.  Psychiatric/Behavioral: Negative.      Physical Exam Vitals and nursing note reviewed.  Constitutional:      General: She is not in acute  distress.    Appearance: She is well-developed. She is obese. She is not diaphoretic.  HENT:     Head: Normocephalic and atraumatic.     Mouth/Throat:     Pharynx: No oropharyngeal exudate.  Eyes:     Pupils: Pupils are equal, round, and reactive to light.  Neck:     Thyroid: No thyromegaly.     Vascular: No JVD.     Trachea: No tracheal deviation.  Cardiovascular:     Rate and Rhythm: Normal rate and regular rhythm.     Heart sounds: Normal heart sounds. No murmur heard.    No friction rub. No gallop.  Pulmonary:     Effort: Pulmonary effort is normal. No respiratory distress.     Breath sounds: No wheezing or rales.  Chest:     Chest wall: No tenderness.  Breasts:    Right: Normal. No mass.     Left: Normal. No mass.  Abdominal:      General: Bowel sounds are normal.     Palpations: Abdomen is soft.  Musculoskeletal:        General: Normal range of motion.     Cervical back: Normal range of motion and neck supple.  Lymphadenopathy:     Cervical: No cervical adenopathy.  Skin:    General: Skin is warm and dry.  Neurological:     Mental Status: She is alert and oriented to person, place, and time.     Cranial Nerves: No cranial nerve deficit.  Psychiatric:        Behavior: Behavior normal.        Thought Content: Thought content normal.        Judgment: Judgment normal.       Assessment/Plan: 1. Acute non-recurrent frontal sinusitis Mostly resolved except for lingering cough. Will complete ABX and may take mucinex, nasal spray, and cough medication as needed  2. Acute cough Complete ABX and may take cough medication as needed as well as mucinex and nasal spray. Cough can linger, but if any worsening or no further improvement then pt will call as chest xray may be indicated.   General Counseling: Ashley Mckay verbalizes understanding of the findings of todays visit and agrees with plan of treatment. I have discussed any further diagnostic evaluation that may be needed or ordered today. We also reviewed her medications today. she has been encouraged to call the office with any questions or concerns that should arise related to todays visit.    Counseling:    No orders of the defined types were placed in this encounter.   No orders of the defined types were placed in this encounter.   Time spent:25 Minutes

## 2022-03-05 ENCOUNTER — Other Ambulatory Visit: Payer: Self-pay | Admitting: Physician Assistant

## 2022-03-23 ENCOUNTER — Encounter: Payer: Self-pay | Admitting: Nurse Practitioner

## 2022-04-02 ENCOUNTER — Other Ambulatory Visit: Payer: Self-pay | Admitting: Physician Assistant

## 2022-04-15 ENCOUNTER — Ambulatory Visit: Payer: Medicare HMO | Admitting: Physician Assistant

## 2022-04-15 ENCOUNTER — Encounter: Payer: Self-pay | Admitting: Physician Assistant

## 2022-04-15 VITALS — BP 130/80 | HR 74 | Temp 97.8°F | Resp 16 | Ht 62.0 in | Wt 221.0 lb

## 2022-04-15 DIAGNOSIS — Z7189 Other specified counseling: Secondary | ICD-10-CM | POA: Diagnosis not present

## 2022-04-15 DIAGNOSIS — G4733 Obstructive sleep apnea (adult) (pediatric): Secondary | ICD-10-CM | POA: Diagnosis not present

## 2022-04-15 DIAGNOSIS — J301 Allergic rhinitis due to pollen: Secondary | ICD-10-CM

## 2022-04-15 DIAGNOSIS — I10 Essential (primary) hypertension: Secondary | ICD-10-CM | POA: Diagnosis not present

## 2022-04-15 DIAGNOSIS — Z6841 Body Mass Index (BMI) 40.0 and over, adult: Secondary | ICD-10-CM | POA: Diagnosis not present

## 2022-04-15 NOTE — Patient Instructions (Signed)

## 2022-04-15 NOTE — Progress Notes (Signed)
[facility] [place], [place] [address]  Pulmonary Sleep Medicine   Office Visit Note  Patient Name: Ashley Mckay DOB: May 26, 1951 MRN [id number]  Date of Service: 04/15/2022  Complaints/HPI: Pt is here for routine pulmonary follow up on cpap. Wearing CPAP nightly. Denies any SOB or dryness. Will use [place] gel as needed if any dryness occurs. Feels well rested in morning. Gets supplies from [facility]. PFT was normal. Uses nasal spray and antihistamine to help with allergies/sinuses.  ROS  General: (-) fever, (-) chills, (-) night sweats, (-) weakness Skin: (-) rashes, (-) itching,. Eyes: (-) visual changes, (-) redness, (-) itching. Nose and Sinuses: (-) nasal stuffiness or itchiness, (-) postnasal drip, (-) nosebleeds, (-) sinus trouble. Mouth and Throat: (-) sore throat, (-) hoarseness. Neck: (-) swollen glands, (-) enlarged thyroid, (-) neck pain. Respiratory: - cough, (-) bloody sputum, - shortness of breath, - wheezing. Cardiovascular: - ankle swelling, (-) chest pain. Lymphatic: (-) lymph node enlargement. Neurologic: (-) numbness, (-) tingling. Psychiatric: (-) anxiety, (-) depression   Current Medication: Outpatient Encounter Medications as of 04/15/2022  Medication Sig Note   aspirin EC 81 MG tablet Take 81 mg by mouth daily. Swallow whole.    Docusate Calcium (STOOL SOFTENER PO) Take by mouth.    ibuprofen (ADVIL,MOTRIN) 800 MG tablet 800 mg every 8 (eight) hours as needed.  06/21/2014: Received from: External Pharmacy   levocetirizine (XYZAL) 5 MG tablet Take 1 tablet (5 mg total) by mouth every evening.    Magnesium 250 MG TABS Take by mouth.    ondansetron (ZOFRAN) 4 MG tablet Take 1 tablet (4 mg total) by mouth every 8 (eight) hours as needed for nausea or vomiting.    oxybutynin (DITROPAN-XL) 10 MG 24 hr tablet TAKE ONE TABLET BY MOUTH DAILY AT BEDTIME    propranolol ER (INDERAL LA) 80 MG 24 hr capsule Take 1 capsule (80 mg total) by mouth  daily.    rosuvastatin (CRESTOR) 10 MG tablet Take 1 tablet (10 mg total) by mouth daily.    topiramate (TOPAMAX) 25 MG tablet TAKE ONE TABLET BY MOUTH ONE TIME DAILY    triamcinolone cream (KENALOG) 0.1 % Apply 1 application topically 2 (two) times daily.    VITAMIN D, CHOLECALCIFEROL, PO Take 1,000 Int'l Units by mouth daily.    [DISCONTINUED] chlorpheniramine-HYDROcodone (TUSSIONEX PENNKINETIC ER) 10-8 MG/5ML Take 5 mLs by mouth every 12 (twelve) hours as needed for cough.    [DISCONTINUED] doxycycline (VIBRA-TABS) 100 MG tablet Take 1 tablet (100 mg total) by mouth 2 (two) times daily.    [DISCONTINUED] oxybutynin (DITROPAN-XL) 10 MG 24 hr tablet Take 1 tablet (10 mg total) by mouth at bedtime.    [DISCONTINUED] topiramate (TOPAMAX) 25 MG tablet Take 1 tablet (25 mg total) by mouth daily.    No facility-administered encounter medications on file as of 04/15/2022.    Surgical History: Past Surgical History:  Procedure Laterality Date   COLONOSCOPY     COLONOSCOPY WITH PROPOFOL N/A 01/31/2016   Procedure: COLONOSCOPY WITH PROPOFOL;  Surgeon: Manya Silvas, MD;  Location: [facility] ENDOSCOPY;  Service: Endoscopy;  Laterality: N/A;   ESOPHAGOGASTRODUODENOSCOPY     tooth implant     started in 2020 but finished dec 2021    Medical History: Past Medical History:  Diagnosis Date   Arthritis    Headache    History of chickenpox    Hypertension    Sleep apnea     Family History: Family History  Problem Relation Age of Onset   Breast cancer Mother 25    Social History: Social History   Socioeconomic History   Marital status: Married    Spouse name: Not on file   Number of children: Not on file   Years of education: Not on file   Highest education level: Not on file  Occupational History   Not on file  Tobacco Use   Smoking status: Never   Smokeless tobacco: Never  Substance and Sexual Activity   Alcohol use: Yes    Comment: rarely   Drug use: No   Sexual activity: Not  on file  Other Topics Concern   Not on file  Social History Narrative   Not on file   Social Determinants of Health   Financial Resource Strain: Not on file  Food Insecurity: Not on file  Transportation Needs: Not on file  Physical Activity: Not on file  Stress: Not on file  Social Connections: Not on file  Intimate Partner Violence: Not on file    Vital Signs: Blood pressure 130/80, pulse 74, temperature 97.8 F (36.6 C), resp. rate 16, height '5\' 2"'$  (1.575 m), weight 221 lb (100.2 kg), SpO2 95 %.  Examination: General Appearance: The patient is well-developed, well-nourished, and in no distress. Skin: Gross inspection of skin unremarkable. Head: normocephalic, no gross deformities. Eyes: no gross deformities noted. ENT: ears appear grossly normal no exudates. Neck: Supple. No thyromegaly. No LAD. Respiratory: Lungs clear to auscultation bilaterally. Cardiovascular: Normal S1 and S2 without murmur or rub. Extremities: No cyanosis. pulses are equal. Neurologic: Alert and oriented. No involuntary movements.  LABS: No results found for this or any previous visit (from the past 2160 hour(s)).  Radiology: MM 3D SCREEN BREAST BILATERAL  Result Date: 06/04/2021 CLINICAL DATA:  Screening. EXAM: DIGITAL SCREENING BILATERAL MAMMOGRAM WITH TOMOSYNTHESIS AND CAD TECHNIQUE: Bilateral screening digital craniocaudal and mediolateral oblique mammograms were obtained. Bilateral screening digital breast tomosynthesis was performed. The images were evaluated with computer-aided detection. COMPARISON:  Previous exam(s). ACR Breast Density Category c: The breast tissue is heterogeneously dense, which may obscure small masses. FINDINGS: There are no findings suspicious for malignancy. IMPRESSION: No mammographic evidence of malignancy. A result letter of this screening mammogram will be mailed directly to the patient. RECOMMENDATION: Screening mammogram in one year. (Code:SM-B-01Y) BI-RADS CATEGORY   1: Negative. Electronically Signed   By: Fidela Salisbury M.D.   On: 06/04/2021 17:12   No results found.  No results found.    Assessment and Plan: Patient Active Problem List   Diagnosis Date Noted   Encounter for screening mammogram for malignant neoplasm of breast 04/24/2020   OSA on CPAP 01/07/2020   Urinary incontinence 04/25/2019   Need for vaccination against Streptococcus pneumoniae using pneumococcal conjugate vaccine 13 06/30/2018   Rash 04/14/2018   Vaginal candidiasis 04/14/2018   Stomatitis and mucositis 04/14/2018   Other atopic dermatitis 04/14/2018   Nausea 04/14/2018   Encounter for general adult medical examination with abnormal findings 12/29/2017   Essential hypertension 12/29/2017   Mixed hyperlipidemia 12/29/2017   Dysuria 12/29/2017   Non-seasonal allergic rhinitis due to pollen 12/29/2017   Unspecified menopausal and perimenopausal disorder 12/29/2017   Urinary tract infection with hematuria 12/29/2017   Neoplasm of uncertain behavior of skin of lower extremity 12/29/2017   Plantar fasciitis 09/11/2015   Paronychia 08/25/2014    1. OSA (obstructive sleep apnea) Continue nightly use  2. CPAP use counseling CPAP couseling-Discussed importance of adequate CPAP use as well  as proper care and cleaning techniques of machine and all supplies.  3. Essential hypertension Stable, continue current medications  4. Non-seasonal allergic rhinitis due to pollen Continue current medications  5. Morbid obesity with BMI of 40.0-44.9, adult ([place]) Obesity Counseling: Had a lengthy discussion regarding patients BMI and weight issues. Patient was instructed on portion control as well as increased activity. Also discussed caloric restrictions with trying to maintain intake less than 2000 Kcal. Discussions were made in accordance with the 5As of weight management. Simple actions such as not eating late and if able to, taking a walk is suggested.   General  Counseling: I have discussed the findings of the evaluation and examination with Ashley Mckay.  I have also discussed any further diagnostic evaluation thatmay be needed or ordered today. Ashley Mckay verbalizes understanding of the findings of todays visit. We also reviewed her medications today and discussed drug interactions and side effects including but not limited excessive drowsiness and altered mental states. We also discussed that there is always a risk not just to her but also people around her. she has been encouraged to call the office with any questions or concerns that should arise related to todays visit.  No orders of the defined types were placed in this encounter.    Time spent: 30  I have personally obtained a history, examined the patient, evaluated laboratory and imaging results, formulated the assessment and plan and placed orders. This patient was seen by Drema Dallas, PA-C in collaboration with Dr. Devona Konig as a part of collaborative care agreement.     Allyne Gee, MD [facility] Pulmonary and Critical Care Sleep medicine

## 2022-04-16 ENCOUNTER — Other Ambulatory Visit: Payer: Self-pay | Admitting: Physician Assistant

## 2022-04-16 DIAGNOSIS — R32 Unspecified urinary incontinence: Secondary | ICD-10-CM

## 2022-04-18 ENCOUNTER — Ambulatory Visit: Payer: Medicare HMO | Admitting: Physician Assistant

## 2022-04-24 ENCOUNTER — Other Ambulatory Visit
Admission: RE | Admit: 2022-04-24 | Discharge: 2022-04-24 | Disposition: A | Discharge status: Home / Self Care | Payer: Medicare HMO | Source: Ambulatory Visit | Attending: Physician Assistant | Admitting: Physician Assistant

## 2022-04-24 DIAGNOSIS — R5383 Other fatigue: Secondary | ICD-10-CM | POA: Insufficient documentation

## 2022-04-24 DIAGNOSIS — R7989 Other specified abnormal findings of blood chemistry: Secondary | ICD-10-CM | POA: Insufficient documentation

## 2022-04-24 DIAGNOSIS — E538 Deficiency of other specified B group vitamins: Secondary | ICD-10-CM | POA: Diagnosis not present

## 2022-04-24 DIAGNOSIS — E782 Mixed hyperlipidemia: Secondary | ICD-10-CM | POA: Insufficient documentation

## 2022-04-24 DIAGNOSIS — E559 Vitamin D deficiency, unspecified: Secondary | ICD-10-CM | POA: Insufficient documentation

## 2022-04-24 LAB — LIPID PANEL
Cholesterol: 150 mg/dL (ref 0–200)
HDL: 52 mg/dL (ref >40)
LDL Cholesterol: 67 mg/dL (ref 0–99)
Total CHOL/HDL Ratio: 2.9 RATIO
Triglycerides: 155 mg/dL — ABNORMAL HIGH (ref <150)
VLDL: 31 mg/dL (ref 0–40)

## 2022-04-24 LAB — COMPREHENSIVE METABOLIC PANEL
ALT: 12 U/L (ref 0–44)
AST: 19 U/L (ref 15–41)
Albumin: 3.9 g/dL (ref 3.5–5.0)
Alkaline Phosphatase: 76 U/L (ref 38–126)
Anion gap: 8 (ref 5–15)
BUN: 17 mg/dL (ref 8–23)
CO2: 24 mmol/L (ref 22–32)
Calcium: 9.1 mg/dL (ref 8.9–10.3)
Chloride: 108 mmol/L (ref 98–111)
Creatinine, Ser: 0.7 mg/dL (ref 0.44–1.00)
GFR, Estimated: >60 mL/min (ref >60)
Glucose, Bld: 106 mg/dL — ABNORMAL HIGH (ref 70–99)
Potassium: 3.9 mmol/L (ref 3.5–5.1)
Sodium: 140 mmol/L (ref 135–145)
Total Bilirubin: 0.7 mg/dL (ref 0.3–1.2)
Total Protein: 7.4 g/dL (ref 6.5–8.1)

## 2022-04-24 LAB — CBC WITH DIFFERENTIAL/PLATELET
Abs Immature Granulocytes: 0.03 10*3/uL (ref 0.00–0.07)
Basophils Absolute: 0.1 10*3/uL (ref 0.0–0.1)
Basophils Relative: 1 %
Eosinophils Absolute: 0.2 10*3/uL (ref 0.0–0.5)
Eosinophils Relative: 4 %
HCT: 41.9 % (ref 36.0–46.0)
Hemoglobin: 13.8 g/dL (ref 12.0–15.0)
Immature Granulocytes: 1 %
Lymphocytes Relative: 21 %
Lymphs Abs: 1.2 10*3/uL (ref 0.7–4.0)
MCH: 30.7 pg (ref 26.0–34.0)
MCHC: 32.9 g/dL (ref 30.0–36.0)
MCV: 93.3 fL (ref 80.0–100.0)
Monocytes Absolute: 0.6 10*3/uL (ref 0.1–1.0)
Monocytes Relative: 10 %
Neutro Abs: 3.7 10*3/uL (ref 1.7–7.7)
Neutrophils Relative %: 63 %
Platelets: 235 10*3/uL (ref 150–400)
RBC: 4.49 MIL/uL (ref 3.87–5.11)
RDW: 13.2 % (ref 11.5–15.5)
WBC: 5.8 10*3/uL (ref 4.0–10.5)
nRBC: 0 % (ref 0.0–0.2)

## 2022-04-24 LAB — VITAMIN B12: Vitamin B-12: 133 pg/mL — ABNORMAL LOW (ref 180–914)

## 2022-04-24 LAB — TSH: TSH: 3 u[IU]/mL (ref 0.350–4.500)

## 2022-04-24 LAB — VITAMIN D 25 HYDROXY (VIT D DEFICIENCY, FRACTURES): Vit D, 25-Hydroxy: 33.69 ng/mL (ref 30–100)

## 2022-04-24 LAB — FOLATE: Folate: 23 ng/mL (ref >5.9)

## 2022-04-24 LAB — T4, FREE: Free T4: 0.73 ng/dL (ref 0.61–1.12)

## 2022-05-03 ENCOUNTER — Ambulatory Visit (INDEPENDENT_AMBULATORY_CARE_PROVIDER_SITE_OTHER): Payer: Medicare HMO | Admitting: Physician Assistant

## 2022-05-03 ENCOUNTER — Encounter: Payer: Self-pay | Admitting: Physician Assistant

## 2022-05-03 DIAGNOSIS — Z0001 Encounter for general adult medical examination with abnormal findings: Secondary | ICD-10-CM

## 2022-05-03 DIAGNOSIS — R7301 Impaired fasting glucose: Secondary | ICD-10-CM | POA: Diagnosis not present

## 2022-05-03 DIAGNOSIS — I10 Essential (primary) hypertension: Secondary | ICD-10-CM

## 2022-05-03 DIAGNOSIS — E538 Deficiency of other specified B group vitamins: Secondary | ICD-10-CM | POA: Diagnosis not present

## 2022-05-03 DIAGNOSIS — R300 Dysuria: Secondary | ICD-10-CM | POA: Diagnosis not present

## 2022-05-03 DIAGNOSIS — E559 Vitamin D deficiency, unspecified: Secondary | ICD-10-CM

## 2022-05-03 DIAGNOSIS — Z1231 Encounter for screening mammogram for malignant neoplasm of breast: Secondary | ICD-10-CM

## 2022-05-03 LAB — POCT GLYCOSYLATED HEMOGLOBIN (HGB A1C): Hemoglobin A1C: 5.5 % (ref 4.0–5.6)

## 2022-05-03 MED ORDER — CYANOCOBALAMIN 1000 MCG/ML IJ SOLN
1000.0000 ug | Freq: Once | INTRAMUSCULAR | Status: AC
  Administered 2022-05-03: 1000 ug via INTRAMUSCULAR

## 2022-05-03 NOTE — Progress Notes (Signed)
[facility], [place] [address]  Internal MEDICINE  Office Visit Note  Patient Name: Ashley Mckay  [id number]  [id number]  Date of Service: 05/14/2022  Chief Complaint  Patient presents with   Medicare Wellness   Hypertension     HPI Pt is here for routine health maintenance examination -No new concerns today -BP stable -Sleeping well, wearing cpap -labs reviewed-glucose slightly elevated again, low B12 and borderline low vit D. Will restart b12 injections and do OTC vit D. -UTD on colonoscopy, due for mammogram -she is going to get her shingles vaccine  Current Medication: Outpatient Encounter Medications as of 05/03/2022  Medication Sig Note   aspirin EC 81 MG tablet Take 81 mg by mouth daily. Swallow whole.    Docusate Calcium (STOOL SOFTENER PO) Take by mouth.    ibuprofen (ADVIL,MOTRIN) 800 MG tablet 800 mg every 8 (eight) hours as needed.  06/21/2014: Received from: External Pharmacy   Magnesium 250 MG TABS Take by mouth.    ondansetron (ZOFRAN) 4 MG tablet Take 1 tablet (4 mg total) by mouth every 8 (eight) hours as needed for nausea or vomiting.    triamcinolone cream (KENALOG) 0.1 % Apply 1 application topically 2 (two) times daily.    VITAMIN D, CHOLECALCIFEROL, PO Take 1,000 Int'l Units by mouth daily.    [DISCONTINUED] levocetirizine (XYZAL) 5 MG tablet Take 1 tablet (5 mg total) by mouth every evening.    [DISCONTINUED] oxybutynin (DITROPAN-XL) 10 MG 24 hr tablet TAKE ONE TABLET BY MOUTH DAILY AT BEDTIME    [DISCONTINUED] propranolol ER (INDERAL LA) 80 MG 24 hr capsule Take 1 capsule (80 mg total) by mouth daily.    [DISCONTINUED] rosuvastatin (CRESTOR) 10 MG tablet Take 1 tablet (10 mg total) by mouth daily.    [DISCONTINUED] topiramate (TOPAMAX) 25 MG tablet TAKE ONE TABLET BY MOUTH ONE TIME DAILY    [EXPIRED] cyanocobalamin (VITAMIN B12) injection 1,000 mcg     No facility-administered encounter medications on file as of  05/03/2022.    Surgical History: Past Surgical History:  Procedure Laterality Date   COLONOSCOPY     COLONOSCOPY WITH PROPOFOL N/A 01/31/2016   Procedure: COLONOSCOPY WITH PROPOFOL;  Surgeon: Manya Silvas, MD;  Location: [facility] ENDOSCOPY;  Service: Endoscopy;  Laterality: N/A;   ESOPHAGOGASTRODUODENOSCOPY     tooth implant     started in 2020 but finished dec 2021    Medical History: Past Medical History:  Diagnosis Date   Arthritis    Headache    History of chickenpox    Hypertension    Sleep apnea     Family History: Family History  Problem Relation Age of Onset   Breast cancer Mother 36      Review of Systems  Constitutional:  Negative for chills, fatigue and unexpected weight change.  HENT:  Positive for postnasal drip. Negative for congestion, rhinorrhea, sneezing and sore throat.   Eyes:  Negative for redness.  Respiratory:  Negative for cough, chest tightness and shortness of breath.   Cardiovascular:  Negative for chest pain and palpitations.  Gastrointestinal:  Negative for abdominal pain, constipation, diarrhea, nausea and vomiting.  Genitourinary:  Negative for dysuria and frequency.  Musculoskeletal:  Negative for arthralgias, back pain, joint swelling and neck pain.  Skin:  Negative for rash.  Neurological: Negative.  Negative for tremors and numbness.  Hematological:  Negative for adenopathy. Does not bruise/bleed easily.  Psychiatric/Behavioral:  Negative for behavioral problems (Depression), sleep disturbance and  suicidal ideas. The patient is not nervous/anxious.      Vital Signs: BP 135/74   Pulse 78   Temp 98 F (36.7 C)   Resp 16   Ht '5\' 2"'$  (1.575 m)   Wt 221 lb (100.2 kg)   LMP  (LMP Unknown)   SpO2 95%   BMI 40.42 kg/m    Physical Exam Vitals and nursing note reviewed.  Constitutional:      General: She is not in acute distress.    Appearance: She is well-developed. She is obese. She is not diaphoretic.  HENT:     Head:  Normocephalic and atraumatic.     Mouth/Throat:     Pharynx: No oropharyngeal exudate.  Eyes:     Pupils: Pupils are equal, round, and reactive to light.  Neck:     Thyroid: No thyromegaly.     Vascular: No JVD.     Trachea: No tracheal deviation.  Cardiovascular:     Rate and Rhythm: Normal rate and regular rhythm.     Heart sounds: Normal heart sounds. No murmur heard.    No friction rub. No gallop.  Pulmonary:     Effort: Pulmonary effort is normal. No respiratory distress.     Breath sounds: No wheezing or rales.  Chest:     Chest wall: No tenderness.  Breasts:    Right: Normal. No mass.     Left: Normal. No mass.  Abdominal:     General: Bowel sounds are normal.     Palpations: Abdomen is soft.     Tenderness: There is no abdominal tenderness.  Musculoskeletal:        General: Normal range of motion.     Cervical back: Normal range of motion and neck supple.  Lymphadenopathy:     Cervical: No cervical adenopathy.  Skin:    General: Skin is warm and dry.  Neurological:     Mental Status: She is alert and oriented to person, place, and time.     Cranial Nerves: No cranial nerve deficit.  Psychiatric:        Behavior: Behavior normal.        Thought Content: Thought content normal.        Judgment: Judgment normal.      LABS: Recent Results (from the past 2160 hour(s))  CBC with Differential/Platelet     Status: None   Collection Time: 04/24/22  9:45 AM  Result Value Ref Range   WBC 5.8 4.0 - 10.5 K/uL   RBC 4.49 3.87 - 5.11 MIL/uL   Hemoglobin 13.8 12.0 - 15.0 g/dL   HCT 41.9 36.0 - 46.0 %   MCV 93.3 80.0 - 100.0 fL   MCH 30.7 26.0 - 34.0 pg   MCHC 32.9 30.0 - 36.0 g/dL   RDW 13.2 11.5 - 15.5 %   Platelets 235 150 - 400 K/uL   nRBC 0.0 0.0 - 0.2 %   Neutrophils Relative % 63 %   Neutro Abs 3.7 1.7 - 7.7 K/uL   Lymphocytes Relative 21 %   Lymphs Abs 1.2 0.7 - 4.0 K/uL   Monocytes Relative 10 %   Monocytes Absolute 0.6 0.1 - 1.0 K/uL   Eosinophils  Relative 4 %   Eosinophils Absolute 0.2 0.0 - 0.5 K/uL   Basophils Relative 1 %   Basophils Absolute 0.1 0.0 - 0.1 K/uL   Immature Granulocytes 1 %   Abs Immature Granulocytes 0.03 0.00 - 0.07 K/uL    Comment: Performed at  [facility], [address] [place], [place] [address]  Comprehensive metabolic panel     Status: Abnormal   Collection Time: 04/24/22  9:45 AM  Result Value Ref Range   Sodium 140 135 - 145 mmol/L   Potassium 3.9 3.5 - 5.1 mmol/L   Chloride 108 98 - 111 mmol/L   CO2 24 22 - 32 mmol/L   Glucose, Bld 106 (H) 70 - 99 mg/dL    Comment: Glucose reference range applies only to samples taken after fasting for at least 8 hours.   BUN 17 8 - 23 mg/dL   Creatinine, Ser 0.70 0.44 - 1.00 mg/dL   Calcium 9.1 8.9 - 10.3 mg/dL   Total Protein 7.4 6.5 - 8.1 g/dL   Albumin 3.9 3.5 - 5.0 g/dL   AST 19 15 - 41 U/L   ALT 12 0 - 44 U/L   Alkaline Phosphatase 76 38 - 126 U/L   Total Bilirubin 0.7 0.3 - 1.2 mg/dL   GFR, Estimated >60 >60 mL/min    Comment: (NOTE) Calculated using the CKD-EPI Creatinine Equation (2021)    Anion gap 8 5 - 15    Comment: Performed at [facility], [address] [place], [place] [address]  TSH     Status: None   Collection Time: 04/24/22  9:45 AM  Result Value Ref Range   TSH 3.000 0.350 - 4.500 uIU/mL    Comment: Performed by a 3rd Generation assay with a functional sensitivity of <=0.01 uIU/mL. Performed at [facility], [address] [place], [place] [address]   T4, free     Status: None   Collection Time: 04/24/22  9:45 AM  Result Value Ref Range   Free T4 0.73 0.61 - 1.12 ng/dL    Comment: (NOTE) Biotin ingestion may interfere with free T4 tests. If the results are inconsistent with the TSH level, previous test results, or the clinical presentation, then consider biotin interference. If needed, order repeat testing after stopping biotin. Performed at [facility], [address]  [place],  [address]   Lipid panel     Status: Abnormal   Collection Time: 04/24/22  9:45 AM  Result Value Ref Range   Cholesterol 150 0 - 200 mg/dL   Triglycerides 155 (H) <150 mg/dL   HDL 52 >40 mg/dL   Total CHOL/HDL Ratio 2.9 RATIO   VLDL 31 0 - 40 mg/dL   LDL Cholesterol 67 0 - 99 mg/dL    Comment:        Total Cholesterol/HDL:CHD Risk Coronary Heart Disease Risk Table                     Men   Women  1/2 Average Risk   3.4   3.3  Average Risk       5.0   4.4  2 X Average Risk   9.6   7.1  3 X Average Risk  23.4   11.0        Use the calculated Patient Ratio above and the CHD Risk Table to determine the patient's CHD Risk.        ATP III CLASSIFICATION (LDL):  <100     mg/dL   Optimal  100-129  mg/dL   Near or Above                    Optimal  130-159  mg/dL   Borderline  160-189  mg/dL  High  >190     mg/dL   Very High Performed at [facility], [address] [place], [place] [address]   VITAMIN D 25 Hydroxy (Vit-D Deficiency, Fractures)     Status: None   Collection Time: 04/24/22  9:45 AM  Result Value Ref Range   Vit D, 25-Hydroxy 33.69 30 - 100 ng/mL    Comment: (NOTE) Vitamin D deficiency has been defined by the Institute of Medicine  and an Endocrine Society practice guideline as a level of serum 25-OH  vitamin D less than 20 ng/mL (1,2). The Endocrine Society went on to  further define vitamin D insufficiency as a level between 21 and 29  ng/mL (2).  1. IOM (Institute of Medicine). 2010. Dietary reference intakes for  calcium and D. [place]: The [employer]. 2. Holick MF, Binkley Churchill, Bischoff-Ferrari HA, et al. Evaluation,  treatment, and prevention of vitamin D deficiency: an Endocrine  Society clinical practice guideline, JCEM. 2011 Jul; 96(7): 1911-30.  Performed at [facility], [place] [address] [place], [place] [address]   Vitamin B12     Status: Abnormal   Collection Time: 04/24/22  9:45 AM  Result Value  Ref Range   Vitamin B-12 133 (L) 180 - 914 pg/mL    Comment: (NOTE) This assay is not validated for testing neonatal or myeloproliferative syndrome specimens for Vitamin B12 levels. Performed at [facility], [place] [address], [place], [place] [address]   Folate     Status: None   Collection Time: 04/24/22  9:45 AM  Result Value Ref Range   Folate 23.0 >5.9 ng/mL    Comment: Performed at [facility], [place], [place] [address]  POCT HgB A1C     Status: None   Collection Time: 05/03/22  9:59 AM  Result Value Ref Range   Hemoglobin A1C 5.5 4.0 - 5.6 %   HbA1c POC (<> result, manual entry)     HbA1c, POC (prediabetic range)     HbA1c, POC (controlled diabetic range)    UA/M w/rflx Culture, Routine     Status: Abnormal   Collection Time: 05/03/22 11:40 AM   Specimen: Urine   Urine  Result Value Ref Range   Specific Gravity, UA 1.014 1.005 - 1.030   pH, UA 7.0 5.0 - 7.5   Color, UA Yellow Yellow   Appearance Ur Clear Clear   Leukocytes,UA 1+ (A) Negative   Protein,UA Negative Negative/Trace   Glucose, UA Negative Negative   Ketones, UA Negative Negative   RBC, UA Trace (A) Negative   Bilirubin, UA Negative Negative   Urobilinogen, Ur 0.2 0.2 - 1.0 mg/dL   Nitrite, UA Negative Negative   Microscopic Examination See below:     Comment: Microscopic was indicated and was performed.   Urinalysis Reflex Comment     Comment: This specimen has reflexed to a Urine Culture.  Microscopic Examination     Status: Abnormal   Collection Time: 05/03/22 11:40 AM   Urine  Result Value Ref Range   WBC, UA 11-30 (A) 0 - 5 /hpf   RBC, Urine 0-2 0 - 2 /hpf   Epithelial Cells (non renal) 0-10 0 - 10 /hpf   Casts None seen None seen /lpf   Bacteria, UA Many (A) None seen/Few  Urine Culture, Reflex     Status: Abnormal   Collection Time: 05/03/22 11:40 AM   Urine  Result Value Ref Range   Urine  Culture, Routine Final report (A)    Organism ID, Bacteria  Escherichia coli (A)     Comment: Cefazolin <=4 ug/mL Cefazolin with an MIC <=16 predicts susceptibility to the oral agents cefaclor, cefdinir, cefpodoxime, cefprozil, cefuroxime, cephalexin, and loracarbef when used for therapy of uncomplicated urinary tract infections due to E. coli, Klebsiella pneumoniae, and Proteus mirabilis. Greater than 100,000 colony forming units per mL    Antimicrobial Susceptibility Comment     Comment:       ** S = Susceptible; I = Intermediate; R = Resistant **                    P = Positive; N = Negative             MICS are expressed in micrograms per mL    Antibiotic                 RSLT#1    RSLT#2    RSLT#3    RSLT#4 Amoxicillin/Clavulanic Acid    S Ampicillin                     R Cefepime                       S Ceftriaxone                    S Cefuroxime                     S Ciprofloxacin                  S Ertapenem                      S Gentamicin                     S Imipenem                       S Levofloxacin                   S Meropenem                      S Nitrofurantoin                 S Piperacillin/Tazobactam        S Tetracycline                   S Tobramycin                     S Trimethoprim/Sulfa             S     Assessment/Plan: 1. Encounter for general adult medical examination with abnormal findings CPE performed, labs review, UTD on colonoscopy, due for mammogram  2. Essential hypertension Stable, continue current medication  3. Impaired fasting glucose - POCT HgB A1C is 5.5 which is normal, continue to monitor  4. B12 deficiency - cyanocobalamin (VITAMIN B12) injection 1,000 mcg  5. Vitamin D deficiency Start OTC supplement  6. Visit for screening mammogram - MM 3D SCREEN BREAST BILATERAL; Future  7. Dysuria - UA/M w/rflx Culture, Routine - Microscopic Examination - Urine Culture, Reflex   General Counseling: Daviona verbalizes understanding of the findings of todays visit and agrees with plan  of treatment. I  have discussed any further diagnostic evaluation that may be needed or ordered today. We also reviewed her medications today. she has been encouraged to call the office with any questions or concerns that should arise related to todays visit.    Counseling:    Orders Placed This Encounter  Procedures   Microscopic Examination   Urine Culture, Reflex   MM 3D SCREEN BREAST BILATERAL   UA/M w/rflx Culture, Routine   POCT HgB A1C    Meds ordered this encounter  Medications   cyanocobalamin (VITAMIN B12) injection 1,000 mcg    This patient was seen by Drema Dallas, PA-C in collaboration with Dr. Clayborn Bigness as a part of collaborative care agreement.  Total time spent:35 Minutes  Time spent includes review of chart, medications, test results, and follow up plan with the patient.     Lavera Guise, MD  Internal Medicine

## 2022-05-06 ENCOUNTER — Other Ambulatory Visit: Payer: Self-pay

## 2022-05-06 DIAGNOSIS — R32 Unspecified urinary incontinence: Secondary | ICD-10-CM

## 2022-05-06 DIAGNOSIS — E782 Mixed hyperlipidemia: Secondary | ICD-10-CM

## 2022-05-06 DIAGNOSIS — J301 Allergic rhinitis due to pollen: Secondary | ICD-10-CM

## 2022-05-06 DIAGNOSIS — I10 Essential (primary) hypertension: Secondary | ICD-10-CM

## 2022-05-06 MED ORDER — LEVOCETIRIZINE DIHYDROCHLORIDE 5 MG PO TABS: 5.0000 mg | ORAL_TABLET | Freq: Every evening | ORAL | 3 refills | Status: DC

## 2022-05-06 MED ORDER — TOPIRAMATE 25 MG PO TABS: 25.0000 mg | ORAL_TABLET | Freq: Every day | ORAL | 1 refills | Status: DC

## 2022-05-06 MED ORDER — OXYBUTYNIN CHLORIDE ER 10 MG PO TB24: 10.0000 mg | ORAL_TABLET | Freq: Every day | ORAL | 1 refills | Status: DC

## 2022-05-06 MED ORDER — ROSUVASTATIN CALCIUM 10 MG PO TABS: 10.0000 mg | ORAL_TABLET | Freq: Every day | ORAL | 3 refills | Status: DC

## 2022-05-06 MED ORDER — PROPRANOLOL HCL ER 80 MG PO CP24: 80.0000 mg | ORAL_CAPSULE | Freq: Every day | ORAL | 3 refills | Status: DC

## 2022-05-08 ENCOUNTER — Other Ambulatory Visit: Payer: Self-pay

## 2022-05-08 ENCOUNTER — Telehealth: Payer: Self-pay

## 2022-05-08 LAB — MICROSCOPIC EXAMINATION: Casts: NONE SEEN /lpf

## 2022-05-08 LAB — UA/M W/RFLX CULTURE, ROUTINE
Bilirubin, UA: NEGATIVE
Glucose, UA: NEGATIVE
Ketones, UA: NEGATIVE
Nitrite, UA: NEGATIVE
Protein,UA: NEGATIVE
Specific Gravity, UA: 1.014 (ref 1.005–1.030)
Urobilinogen, Ur: 0.2 mg/dL (ref 0.2–1.0)
pH, UA: 7 (ref 5.0–7.5)

## 2022-05-08 LAB — URINE CULTURE, REFLEX

## 2022-05-08 MED ORDER — CIPROFLOXACIN HCL 500 MG PO TABS: 500.0000 mg | ORAL_TABLET | Freq: Two times a day (BID) | ORAL | 0 refills | Status: DC

## 2022-05-08 NOTE — Telephone Encounter (Signed)
-----   Message from Mylinda Latina, PA-C sent at 05/08/2022  2:57 PM EDT ----- Please let her know that she has a UTI and send cipro 500 BID x5 days

## 2022-05-08 NOTE — Telephone Encounter (Signed)
Pt advised urine showed UTI send cipro to phae

## 2022-05-09 DIAGNOSIS — G4733 Obstructive sleep apnea (adult) (pediatric): Secondary | ICD-10-CM | POA: Diagnosis not present

## 2022-05-10 ENCOUNTER — Ambulatory Visit (INDEPENDENT_AMBULATORY_CARE_PROVIDER_SITE_OTHER): Payer: Medicare HMO

## 2022-05-10 DIAGNOSIS — E538 Deficiency of other specified B group vitamins: Secondary | ICD-10-CM

## 2022-05-10 MED ORDER — CYANOCOBALAMIN 1000 MCG/ML IJ SOLN
1000.0000 ug | Freq: Once | INTRAMUSCULAR | Status: AC
  Administered 2022-05-10: 1000 ug via INTRAMUSCULAR

## 2022-05-17 ENCOUNTER — Ambulatory Visit (INDEPENDENT_AMBULATORY_CARE_PROVIDER_SITE_OTHER): Payer: Medicare HMO

## 2022-05-17 DIAGNOSIS — E538 Deficiency of other specified B group vitamins: Secondary | ICD-10-CM | POA: Diagnosis not present

## 2022-05-17 MED ORDER — CYANOCOBALAMIN 1000 MCG/ML IJ SOLN
1000.0000 ug | Freq: Once | INTRAMUSCULAR | Status: AC
  Administered 2022-05-17: 1000 ug via INTRAMUSCULAR

## 2022-05-30 ENCOUNTER — Ambulatory Visit
Admission: RE | Admit: 2022-05-30 | Discharge: 2022-05-30 | Disposition: A | Discharge status: Home / Self Care | Payer: Medicare HMO | Source: Ambulatory Visit | Attending: Physician Assistant | Admitting: Physician Assistant

## 2022-05-30 DIAGNOSIS — Z1231 Encounter for screening mammogram for malignant neoplasm of breast: Secondary | ICD-10-CM | POA: Diagnosis not present

## 2022-06-03 ENCOUNTER — Encounter: Payer: Self-pay | Admitting: Physician Assistant

## 2022-06-03 ENCOUNTER — Telehealth (INDEPENDENT_AMBULATORY_CARE_PROVIDER_SITE_OTHER): Payer: Medicare HMO | Admitting: Physician Assistant

## 2022-06-03 VITALS — BP 115/72 | Temp 97.4°F | Resp 16 | Ht 62.0 in | Wt 222.0 lb

## 2022-06-03 DIAGNOSIS — J029 Acute pharyngitis, unspecified: Secondary | ICD-10-CM | POA: Diagnosis not present

## 2022-06-03 DIAGNOSIS — R0982 Postnasal drip: Secondary | ICD-10-CM | POA: Diagnosis not present

## 2022-06-03 NOTE — Progress Notes (Signed)
[facility] [place], [place] [address]  Internal MEDICINE  Telephone Visit  Patient Name: Ashley Mckay  [id number]  [id number]  Date of Service: 06/03/2022  I connected with the patient at 4:19 by telephone and verified the patients identity using two identifiers.   I discussed the limitations, risks, security and privacy concerns of performing an evaluation and management service by telephone and the availability of in person appointments. I also discussed with the patient that there may be a patient responsible charge related to the service.  The patient expressed understanding and agrees to proceed.    Chief Complaint  Patient presents with   Telephone Screen    [phone], will just do phone call   Telephone Assessment   Cough    HPI Pt is for virtual sick visit -started 1 week ago with scratchy throat, then sore. Having drainage and coughing. Not congested. -Denies SOB or wheezing -Started mucinex and nasal spray recently and this has been helping now. Has also been doing hot tea and soup to help soothe throat. Just wanted to check if anything else she needed to be doing -Did go to an outdoor wedding a few weeks ago and may have triggered this with seasonal change  Current Medication: Outpatient Encounter Medications as of 06/03/2022  Medication Sig Note   aspirin EC 81 MG tablet Take 81 mg by mouth daily. Swallow whole.    ciprofloxacin (CIPRO) 500 MG tablet Take 1 tablet (500 mg total) by mouth 2 (two) times daily.    Docusate Calcium (STOOL SOFTENER PO) Take by mouth.    ibuprofen (ADVIL,MOTRIN) 800 MG tablet 800 mg every 8 (eight) hours as needed.  06/21/2014: Received from: External Pharmacy   levocetirizine (XYZAL) 5 MG tablet Take 1 tablet (5 mg total) by mouth every evening.    Magnesium 250 MG TABS Take by mouth.    ondansetron (ZOFRAN) 4 MG tablet Take 1 tablet (4 mg total) by mouth every 8 (eight) hours as needed for nausea or vomiting.     oxybutynin (DITROPAN-XL) 10 MG 24 hr tablet Take 1 tablet (10 mg total) by mouth at bedtime.    propranolol ER (INDERAL LA) 80 MG 24 hr capsule Take 1 capsule (80 mg total) by mouth daily.    rosuvastatin (CRESTOR) 10 MG tablet Take 1 tablet (10 mg total) by mouth daily.    topiramate (TOPAMAX) 25 MG tablet Take 1 tablet (25 mg total) by mouth daily.    triamcinolone cream (KENALOG) 0.1 % Apply 1 application topically 2 (two) times daily.    VITAMIN D, CHOLECALCIFEROL, PO Take 1,000 Int'l Units by mouth daily.    No facility-administered encounter medications on file as of 06/03/2022.    Surgical History: Past Surgical History:  Procedure Laterality Date   COLONOSCOPY     COLONOSCOPY WITH PROPOFOL N/A 01/31/2016   Procedure: COLONOSCOPY WITH PROPOFOL;  Surgeon: Manya Silvas, MD;  Location: [facility] ENDOSCOPY;  Service: Endoscopy;  Laterality: N/A;   ESOPHAGOGASTRODUODENOSCOPY     tooth implant     started in 2020 but finished dec 2021    Medical History: Past Medical History:  Diagnosis Date   Arthritis    Headache    History of chickenpox    Hypertension    Sleep apnea     Family History: Family History  Problem Relation Age of Onset   Breast cancer Mother 2    Social History   Socioeconomic History   Marital status: Married  Spouse name: Not on file   Number of children: Not on file   Years of education: Not on file   Highest education level: Not on file  Occupational History   Not on file  Tobacco Use   Smoking status: Never   Smokeless tobacco: Never  Substance and Sexual Activity   Alcohol use: Yes    Comment: rarely   Drug use: No   Sexual activity: Not on file  Other Topics Concern   Not on file  Social History Narrative   Not on file   Social Determinants of Health   Financial Resource Strain: Not on file  Food Insecurity: Not on file  Transportation Needs: Not on file  Physical Activity: Not on file  Stress: Not on file  Social  Connections: Not on file  Intimate Partner Violence: Not on file      Review of Systems  Constitutional:  Negative for fatigue and fever.  HENT:  Positive for postnasal drip and sore throat. Negative for congestion, mouth sores and sinus pressure.   Respiratory:  Positive for cough. Negative for shortness of breath and wheezing.   Cardiovascular:  Negative for chest pain.  Genitourinary:  Negative for flank pain.  Psychiatric/Behavioral: Negative.      Vital Signs: BP 115/72   Temp (!) 97.4 F (36.3 C)   Resp 16   Ht '5\' 2"'$  (1.575 m)   Wt 222 lb (100.7 kg)   LMP  (LMP Unknown)   SpO2 95%   BMI 40.60 kg/m    Observation/Objective:  Pt is able to carry out conversation   Assessment/Plan: 1. Postnasal drip Will continue mucinex and flonase and stay well hydrated. Will also continue antihistamine. If worsening/not improving, advised to call and may send ABX if needed.  2. Sore throat Likely secondary to postnasal drip and will continue mucinex and flonase and stay well hydrated. Advised to drink warm tea with honey and soup to soothe throat.   General Counseling: Ashley Mckay verbalizes understanding of the findings of today's phone visit and agrees with plan of treatment. I have discussed any further diagnostic evaluation that may be needed or ordered today. We also reviewed her medications today. she has been encouraged to call the office with any questions or concerns that should arise related to todays visit.    No orders of the defined types were placed in this encounter.   No orders of the defined types were placed in this encounter.   Time spent:25 Minutes    Dr Lavera Guise Internal medicine

## 2022-06-06 ENCOUNTER — Telehealth: Payer: Medicare HMO | Admitting: Physician Assistant

## 2022-06-14 ENCOUNTER — Ambulatory Visit (INDEPENDENT_AMBULATORY_CARE_PROVIDER_SITE_OTHER): Payer: Medicare HMO

## 2022-06-14 DIAGNOSIS — E538 Deficiency of other specified B group vitamins: Secondary | ICD-10-CM

## 2022-06-14 MED ORDER — CYANOCOBALAMIN 1000 MCG/ML IJ SOLN
1000.0000 ug | Freq: Once | INTRAMUSCULAR | Status: AC
  Administered 2022-06-14: 1000 ug via INTRAMUSCULAR

## 2022-07-12 ENCOUNTER — Ambulatory Visit (INDEPENDENT_AMBULATORY_CARE_PROVIDER_SITE_OTHER): Payer: Medicare HMO

## 2022-07-12 DIAGNOSIS — E538 Deficiency of other specified B group vitamins: Secondary | ICD-10-CM | POA: Diagnosis not present

## 2022-07-12 MED ORDER — CYANOCOBALAMIN 1000 MCG/ML IJ SOLN
1000.0000 ug | Freq: Once | INTRAMUSCULAR | Status: AC
  Administered 2022-07-12: 1000 ug via INTRAMUSCULAR

## 2022-07-31 DIAGNOSIS — G4733 Obstructive sleep apnea (adult) (pediatric): Secondary | ICD-10-CM | POA: Diagnosis not present

## 2022-08-08 DIAGNOSIS — H00022 Hordeolum internum right lower eyelid: Secondary | ICD-10-CM | POA: Diagnosis not present

## 2022-08-09 ENCOUNTER — Ambulatory Visit (INDEPENDENT_AMBULATORY_CARE_PROVIDER_SITE_OTHER): Payer: Medicare HMO

## 2022-08-09 DIAGNOSIS — E538 Deficiency of other specified B group vitamins: Secondary | ICD-10-CM | POA: Diagnosis not present

## 2022-08-09 MED ORDER — CYANOCOBALAMIN 1000 MCG/ML IJ SOLN
1000.0000 ug | Freq: Once | INTRAMUSCULAR | Status: AC
  Administered 2022-08-09: 1000 ug via INTRAMUSCULAR

## 2022-08-16 DIAGNOSIS — Z1159 Encounter for screening for other viral diseases: Secondary | ICD-10-CM | POA: Diagnosis not present

## 2022-09-06 ENCOUNTER — Ambulatory Visit (INDEPENDENT_AMBULATORY_CARE_PROVIDER_SITE_OTHER): Payer: Medicare HMO | Admitting: Physician Assistant

## 2022-09-06 ENCOUNTER — Encounter: Payer: Self-pay | Admitting: Physician Assistant

## 2022-09-06 VITALS — BP 125/64 | HR 88 | Temp 98.5°F | Resp 16 | Ht 62.0 in | Wt 220.0 lb

## 2022-09-06 DIAGNOSIS — B349 Viral infection, unspecified: Secondary | ICD-10-CM | POA: Diagnosis not present

## 2022-09-06 DIAGNOSIS — E538 Deficiency of other specified B group vitamins: Secondary | ICD-10-CM

## 2022-09-06 DIAGNOSIS — I10 Essential (primary) hypertension: Secondary | ICD-10-CM

## 2022-09-06 MED ORDER — CYANOCOBALAMIN 1000 MCG/ML IJ SOLN
1000.0000 ug | Freq: Once | INTRAMUSCULAR | Status: AC
  Administered 2022-09-06: 1000 ug via INTRAMUSCULAR

## 2022-09-06 NOTE — Progress Notes (Signed)
[facility] [place], [place] [address]  Internal MEDICINE  Office Visit Note  Patient Name: Ashley Mckay  [id number]  [id number]  Date of Service: 09/18/2022  Chief Complaint  Patient presents with   Follow-up    HPI Pt is here for routine follow up -Cold started last night, congestion, headache. Has taken ibuprofen and nasal spray. Going to start mucinex. -Nephew had a cold and was around him. She will take covid test at home if continued symptoms -Otherwise doing well -Could not use cpap last night due to congestion  Current Medication: Outpatient Encounter Medications as of 09/06/2022  Medication Sig Note   aspirin EC 81 MG tablet Take 81 mg by mouth daily. Swallow whole.    ciprofloxacin (CIPRO) 500 MG tablet Take 1 tablet (500 mg total) by mouth 2 (two) times daily.    Docusate Calcium (STOOL SOFTENER PO) Take by mouth.    ibuprofen (ADVIL,MOTRIN) 800 MG tablet 800 mg every 8 (eight) hours as needed.  06/21/2014: Received from: External Pharmacy   levocetirizine (XYZAL) 5 MG tablet Take 1 tablet (5 mg total) by mouth every evening.    Magnesium 250 MG TABS Take by mouth.    ondansetron (ZOFRAN) 4 MG tablet Take 1 tablet (4 mg total) by mouth every 8 (eight) hours as needed for nausea or vomiting.    oxybutynin (DITROPAN-XL) 10 MG 24 hr tablet Take 1 tablet (10 mg total) by mouth at bedtime.    propranolol ER (INDERAL LA) 80 MG 24 hr capsule Take 1 capsule (80 mg total) by mouth daily.    rosuvastatin (CRESTOR) 10 MG tablet Take 1 tablet (10 mg total) by mouth daily.    topiramate (TOPAMAX) 25 MG tablet Take 1 tablet (25 mg total) by mouth daily.    triamcinolone cream (KENALOG) 0.1 % Apply 1 application topically 2 (two) times daily.    VITAMIN D, CHOLECALCIFEROL, PO Take 1,000 Int'l Units by mouth daily.    [EXPIRED] cyanocobalamin (VITAMIN B12) injection 1,000 mcg     No facility-administered encounter medications on file as of 09/06/2022.     Surgical History: Past Surgical History:  Procedure Laterality Date   COLONOSCOPY     COLONOSCOPY WITH PROPOFOL N/A 01/31/2016   Procedure: COLONOSCOPY WITH PROPOFOL;  Surgeon: Manya Silvas, MD;  Location: [facility] ENDOSCOPY;  Service: Endoscopy;  Laterality: N/A;   ESOPHAGOGASTRODUODENOSCOPY     tooth implant     started in 2020 but finished dec 2021    Medical History: Past Medical History:  Diagnosis Date   Arthritis    Headache    History of chickenpox    Hypertension    Sleep apnea     Family History: Family History  Problem Relation Age of Onset   Breast cancer Mother 30    Social History   Socioeconomic History   Marital status: Married    Spouse name: Not on file   Number of children: Not on file   Years of education: Not on file   Highest education level: Not on file  Occupational History   Not on file  Tobacco Use   Smoking status: Never   Smokeless tobacco: Never  Substance and Sexual Activity   Alcohol use: Not Currently    Comment: rarely   Drug use: No   Sexual activity: Not on file  Other Topics Concern   Not on file  Social History Narrative   Not on file   Social Determinants of Health  Financial Resource Strain: Not on file  Food Insecurity: Not on file  Transportation Needs: Not on file  Physical Activity: Not on file  Stress: Not on file  Social Connections: Not on file  Intimate Partner Violence: Not on file      Review of Systems  Constitutional:  Negative for chills, fatigue and unexpected weight change.  HENT:  Positive for congestion and postnasal drip. Negative for rhinorrhea, sneezing and sore throat.   Eyes:  Negative for redness.  Respiratory:  Positive for cough. Negative for chest tightness and shortness of breath.   Cardiovascular:  Negative for chest pain and palpitations.  Gastrointestinal:  Negative for abdominal pain, constipation, diarrhea, nausea and vomiting.  Genitourinary:  Negative for dysuria and  frequency.  Musculoskeletal:  Negative for arthralgias, back pain, joint swelling and neck pain.  Skin:  Negative for rash.  Neurological:  Positive for headaches. Negative for tremors and numbness.  Hematological:  Negative for adenopathy. Does not bruise/bleed easily.  Psychiatric/Behavioral:  Negative for behavioral problems (Depression), sleep disturbance and suicidal ideas. The patient is not nervous/anxious.     Vital Signs: BP 125/64   Pulse 88   Temp 98.5 F (36.9 C)   Resp 16   Ht '5\' 2"'$  (1.575 m)   Wt 220 lb (99.8 kg)   LMP  (LMP Unknown)   SpO2 94%   BMI 40.24 kg/m    Physical Exam Vitals and nursing note reviewed.  Constitutional:      General: She is not in acute distress.    Appearance: She is well-developed. She is obese. She is not diaphoretic.  HENT:     Head: Normocephalic and atraumatic.     Mouth/Throat:     Pharynx: No oropharyngeal exudate.  Eyes:     Pupils: Pupils are equal, round, and reactive to light.  Neck:     Thyroid: No thyromegaly.     Vascular: No JVD.     Trachea: No tracheal deviation.  Cardiovascular:     Rate and Rhythm: Normal rate and regular rhythm.     Heart sounds: Normal heart sounds. No murmur heard.    No friction rub. No gallop.  Pulmonary:     Effort: Pulmonary effort is normal. No respiratory distress.     Breath sounds: No wheezing or rales.  Chest:     Chest wall: No tenderness.  Breasts:    Right: Normal. No mass.     Left: Normal. No mass.  Abdominal:     General: Bowel sounds are normal.     Palpations: Abdomen is soft.  Musculoskeletal:        General: Normal range of motion.     Cervical back: Normal range of motion and neck supple.  Lymphadenopathy:     Cervical: No cervical adenopathy.  Skin:    General: Skin is warm and dry.  Neurological:     Mental Status: She is alert and oriented to person, place, and time.     Cranial Nerves: No cranial nerve deficit.  Psychiatric:        Behavior: Behavior  normal.        Thought Content: Thought content normal.        Judgment: Judgment normal.        Assessment/Plan: 1. Viral infection Will start mucinex-given samples and continue nasal spray. Rest and stay well hydrated. Will consider covid testing if continued symptoms  2. Essential hypertension Stable, continue current medication  3. B12 deficiency - cyanocobalamin (VITAMIN  B12) injection 1,000 mcg   General Counseling: Kamerin verbalizes understanding of the findings of todays visit and agrees with plan of treatment. I have discussed any further diagnostic evaluation that may be needed or ordered today. We also reviewed her medications today. she has been encouraged to call the office with any questions or concerns that should arise related to todays visit.    No orders of the defined types were placed in this encounter.   Meds ordered this encounter  Medications   cyanocobalamin (VITAMIN B12) injection 1,000 mcg    This patient was seen by Drema Dallas, PA-C in collaboration with Dr. Clayborn Bigness as a part of collaborative care agreement.   Total time spent:30 Minutes Time spent includes review of chart, medications, test results, and follow up plan with the patient.      Dr Lavera Guise Internal medicine

## 2022-09-10 ENCOUNTER — Telehealth: Payer: Self-pay

## 2022-09-10 ENCOUNTER — Other Ambulatory Visit: Payer: Self-pay

## 2022-09-10 MED ORDER — AZITHROMYCIN 250 MG PO TABS: 250.0000 mg | ORAL_TABLET | Freq: Every day | ORAL | 0 refills | Status: DC

## 2022-09-10 NOTE — Telephone Encounter (Signed)
Pt called that she tested positive for covid on Saturday and she is feeling good coughing and sinuitis no fever as per lauren send zpak and advised her if symptoms getting worse please call us back

## 2022-09-13 ENCOUNTER — Ambulatory Visit: Payer: Medicare HMO

## 2022-09-25 DIAGNOSIS — G4733 Obstructive sleep apnea (adult) (pediatric): Secondary | ICD-10-CM | POA: Diagnosis not present

## 2022-10-11 ENCOUNTER — Ambulatory Visit: Payer: Medicare HMO

## 2022-10-14 ENCOUNTER — Ambulatory Visit (INDEPENDENT_AMBULATORY_CARE_PROVIDER_SITE_OTHER): Payer: Medicare HMO | Admitting: Internal Medicine

## 2022-10-14 ENCOUNTER — Encounter: Payer: Self-pay | Admitting: Internal Medicine

## 2022-10-14 ENCOUNTER — Ambulatory Visit (INDEPENDENT_AMBULATORY_CARE_PROVIDER_SITE_OTHER): Payer: Medicare HMO

## 2022-10-14 VITALS — BP 122/75 | HR 78 | Temp 98.4°F | Resp 16 | Ht 62.0 in | Wt 218.6 lb

## 2022-10-14 DIAGNOSIS — Z7189 Other specified counseling: Secondary | ICD-10-CM

## 2022-10-14 DIAGNOSIS — E538 Deficiency of other specified B group vitamins: Secondary | ICD-10-CM

## 2022-10-14 DIAGNOSIS — G4733 Obstructive sleep apnea (adult) (pediatric): Secondary | ICD-10-CM

## 2022-10-14 MED ORDER — CYANOCOBALAMIN 1000 MCG/ML IJ SOLN
1000.0000 ug | Freq: Once | INTRAMUSCULAR | Status: AC
  Administered 2022-10-14: 1000 ug via INTRAMUSCULAR

## 2022-10-14 NOTE — Patient Instructions (Signed)

## 2022-10-14 NOTE — Progress Notes (Signed)
[facility] [place], [place] [address]  Pulmonary Sleep Medicine   Office Visit Note  Patient Name: Ashley Mckay DOB: 18-Nov-1950 MRN [id number]  Date of Service: 10/14/2022  Complaints/HPI: On CPAP she states she is doing well. She was asking about inspire and as I explained to her that if her CPAP is working she should stick with the CPAP. She has not done well with weight reduction unfortunately. She states no headaches noted. She states she has no issues with mask fit or leak  ROS  General: (-) fever, (-) chills, (-) night sweats, (-) weakness Skin: (-) rashes, (-) itching,. Eyes: (-) visual changes, (-) redness, (-) itching. Nose and Sinuses: (-) nasal stuffiness or itchiness, (-) postnasal drip, (-) nosebleeds, (-) sinus trouble. Mouth and Throat: (-) sore throat, (-) hoarseness. Neck: (-) swollen glands, (-) enlarged thyroid, (-) neck pain. Respiratory: - cough, (-) bloody sputum, - shortness of breath, - wheezing. Cardiovascular: - ankle swelling, (-) chest pain. Lymphatic: (-) lymph node enlargement. Neurologic: (-) numbness, (-) tingling. Psychiatric: (-) anxiety, (-) depression   Current Medication: Outpatient Encounter Medications as of 10/14/2022  Medication Sig Note   aspirin EC 81 MG tablet Take 81 mg by mouth daily. Swallow whole.    azithromycin (ZITHROMAX) 250 MG tablet Take 1 tablet (250 mg total) by mouth daily.    ciprofloxacin (CIPRO) 500 MG tablet Take 1 tablet (500 mg total) by mouth 2 (two) times daily.    Docusate Calcium (STOOL SOFTENER PO) Take by mouth.    ibuprofen (ADVIL,MOTRIN) 800 MG tablet 800 mg every 8 (eight) hours as needed.  06/21/2014: Received from: External Pharmacy   levocetirizine (XYZAL) 5 MG tablet Take 1 tablet (5 mg total) by mouth every evening.    Magnesium 250 MG TABS Take by mouth.    ondansetron (ZOFRAN) 4 MG tablet Take 1 tablet (4 mg total) by mouth every 8 (eight) hours as needed for nausea or  vomiting.    oxybutynin (DITROPAN-XL) 10 MG 24 hr tablet Take 1 tablet (10 mg total) by mouth at bedtime.    propranolol ER (INDERAL LA) 80 MG 24 hr capsule Take 1 capsule (80 mg total) by mouth daily.    rosuvastatin (CRESTOR) 10 MG tablet Take 1 tablet (10 mg total) by mouth daily.    topiramate (TOPAMAX) 25 MG tablet Take 1 tablet (25 mg total) by mouth daily.    triamcinolone cream (KENALOG) 0.1 % Apply 1 application topically 2 (two) times daily.    VITAMIN D, CHOLECALCIFEROL, PO Take 1,000 Int'l Units by mouth daily.    No facility-administered encounter medications on file as of 10/14/2022.    Surgical History: Past Surgical History:  Procedure Laterality Date   COLONOSCOPY     COLONOSCOPY WITH PROPOFOL N/A 01/31/2016   Procedure: COLONOSCOPY WITH PROPOFOL;  Surgeon: Manya Silvas, MD;  Location: [facility] ENDOSCOPY;  Service: Endoscopy;  Laterality: N/A;   ESOPHAGOGASTRODUODENOSCOPY     tooth implant     started in 2020 but finished dec 2021    Medical History: Past Medical History:  Diagnosis Date   Arthritis    Headache    History of chickenpox    Hypertension    Sleep apnea     Family History: Family History  Problem Relation Age of Onset   Breast cancer Mother 14    Social History: Social History   Socioeconomic History   Marital status: Married    Spouse name: Not on file  Number of children: Not on file   Years of education: Not on file   Highest education level: Not on file  Occupational History   Not on file  Tobacco Use   Smoking status: Never   Smokeless tobacco: Never  Substance and Sexual Activity   Alcohol use: Not Currently    Comment: rarely   Drug use: No   Sexual activity: Not on file  Other Topics Concern   Not on file  Social History Narrative   Not on file   Social Determinants of Health   Financial Resource Strain: Not on file  Food Insecurity: Not on file  Transportation Needs: Not on file  Physical Activity: Not on file   Stress: Not on file  Social Connections: Not on file  Intimate Partner Violence: Not on file    Vital Signs: Blood pressure 122/75, pulse 78, temperature 98.4 F (36.9 C), resp. rate 16, height '5\' 2"'$  (1.575 m), weight 218 lb 9.6 oz (99.2 kg), SpO2 93 %.  Examination: General Appearance: The patient is well-developed, well-nourished, and in no distress. Skin: Gross inspection of skin unremarkable. Head: normocephalic, no gross deformities. Eyes: no gross deformities noted. ENT: ears appear grossly normal no exudates. Neck: Supple. No thyromegaly. No LAD. Respiratory: no rhonchi noted. Cardiovascular: Normal S1 and S2 without murmur or rub. Extremities: No cyanosis. pulses are equal. Neurologic: Alert and oriented. No involuntary movements.  LABS: No results found for this or any previous visit (from the past 2160 hour(s)).  Radiology: MM 3D SCREEN BREAST BILATERAL  Result Date: 05/31/2022 CLINICAL DATA:  Screening. EXAM: DIGITAL SCREENING BILATERAL MAMMOGRAM WITH TOMOSYNTHESIS AND CAD TECHNIQUE: Bilateral screening digital craniocaudal and mediolateral oblique mammograms were obtained. Bilateral screening digital breast tomosynthesis was performed. The images were evaluated with computer-aided detection. COMPARISON:  Previous exam(s). ACR Breast Density Category c: The breast tissue is heterogeneously dense, which may obscure small masses. FINDINGS: There are no findings suspicious for malignancy. IMPRESSION: No mammographic evidence of malignancy. A result letter of this screening mammogram will be mailed directly to the patient. RECOMMENDATION: Screening mammogram in one year. (Code:SM-B-01Y) BI-RADS CATEGORY  1: Negative. Electronically Signed   By: Lillia Mountain M.D.   On: 05/31/2022 12:46    No results found.  No results found.    Assessment and Plan: Patient Active Problem List   Diagnosis Date Noted   Encounter for screening mammogram for malignant neoplasm of breast  04/24/2020   OSA on CPAP 01/07/2020   Urinary incontinence 04/25/2019   Need for vaccination against Streptococcus pneumoniae using pneumococcal conjugate vaccine 13 06/30/2018   Rash 04/14/2018   Vaginal candidiasis 04/14/2018   Stomatitis and mucositis 04/14/2018   Other atopic dermatitis 04/14/2018   Nausea 04/14/2018   Encounter for general adult medical examination with abnormal findings 12/29/2017   Essential hypertension 12/29/2017   Mixed hyperlipidemia 12/29/2017   Dysuria 12/29/2017   Non-seasonal allergic rhinitis due to pollen 12/29/2017   Unspecified menopausal and perimenopausal disorder 12/29/2017   Urinary tract infection with hematuria 12/29/2017   Neoplasm of uncertain behavior of skin of lower extremity 12/29/2017   Plantar fasciitis 09/11/2015   Paronychia 08/25/2014    1. OSA (obstructive sleep apnea) On CPAP will continue with current pressures. Seems to be well controlled  2. Obesity, morbid ([place]) Obesity Counseling: Had a lengthy discussion regarding patients BMI and weight issues. Patient was instructed on portion control as well as increased activity. Also discussed caloric restrictions with trying to maintain intake less than 2000  Kcal. Discussions were made in accordance with the 5As of weight management. Simple actions such as not eating late and if able to, taking a walk is suggested.   3. CPAP use counseling CPAP Counseling: had a lengthy discussion with the patient regarding the importance of PAP therapy in management of the sleep apnea. Patient appears to understand the risk factor reduction and also understands the risks associated with untreated sleep apnea.   General Counseling: I have discussed the findings of the evaluation and examination with Ashley Mckay.  I have also discussed any further diagnostic evaluation thatmay be needed or ordered today. Ashley Mckay verbalizes understanding of the findings of todays visit. We also reviewed her medications today and  discussed drug interactions and side effects including but not limited excessive drowsiness and altered mental states. We also discussed that there is always a risk not just to her but also people around her. she has been encouraged to call the office with any questions or concerns that should arise related to todays visit.  No orders of the defined types were placed in this encounter.    Time spent: 32  I have personally obtained a history, examined the patient, evaluated laboratory and imaging results, formulated the assessment and plan and placed orders.    Allyne Gee, MD [facility] Pulmonary and Critical Care Sleep medicine

## 2022-10-19 ENCOUNTER — Other Ambulatory Visit: Payer: Self-pay | Admitting: Physician Assistant

## 2022-11-12 ENCOUNTER — Ambulatory Visit (INDEPENDENT_AMBULATORY_CARE_PROVIDER_SITE_OTHER): Payer: Medicare HMO

## 2022-11-12 DIAGNOSIS — E538 Deficiency of other specified B group vitamins: Secondary | ICD-10-CM

## 2022-11-12 MED ORDER — CYANOCOBALAMIN 1000 MCG/ML IJ SOLN
1000.0000 ug | Freq: Once | INTRAMUSCULAR | Status: AC
  Administered 2022-11-12: 1000 ug via INTRAMUSCULAR

## 2022-11-25 DIAGNOSIS — H5203 Hypermetropia, bilateral: Secondary | ICD-10-CM | POA: Diagnosis not present

## 2022-11-25 DIAGNOSIS — H52223 Regular astigmatism, bilateral: Secondary | ICD-10-CM | POA: Diagnosis not present

## 2022-11-25 DIAGNOSIS — H31093 Other chorioretinal scars, bilateral: Secondary | ICD-10-CM | POA: Diagnosis not present

## 2022-11-25 DIAGNOSIS — H524 Presbyopia: Secondary | ICD-10-CM | POA: Diagnosis not present

## 2022-11-30 DIAGNOSIS — H524 Presbyopia: Secondary | ICD-10-CM | POA: Diagnosis not present

## 2022-11-30 DIAGNOSIS — H52223 Regular astigmatism, bilateral: Secondary | ICD-10-CM | POA: Diagnosis not present

## 2022-12-06 DIAGNOSIS — G4733 Obstructive sleep apnea (adult) (pediatric): Secondary | ICD-10-CM | POA: Diagnosis not present

## 2023-01-06 ENCOUNTER — Ambulatory Visit: Payer: Medicare HMO | Admitting: Physician Assistant

## 2023-01-09 ENCOUNTER — Encounter: Payer: Self-pay | Admitting: Physician Assistant

## 2023-01-09 ENCOUNTER — Ambulatory Visit (INDEPENDENT_AMBULATORY_CARE_PROVIDER_SITE_OTHER): Payer: Medicare HMO | Admitting: Physician Assistant

## 2023-01-09 VITALS — BP 130/70 | HR 81 | Temp 97.8°F | Resp 16 | Ht 62.0 in | Wt 223.0 lb

## 2023-01-09 DIAGNOSIS — E538 Deficiency of other specified B group vitamins: Secondary | ICD-10-CM | POA: Diagnosis not present

## 2023-01-09 DIAGNOSIS — I10 Essential (primary) hypertension: Secondary | ICD-10-CM | POA: Diagnosis not present

## 2023-01-09 DIAGNOSIS — E559 Vitamin D deficiency, unspecified: Secondary | ICD-10-CM

## 2023-01-09 DIAGNOSIS — E782 Mixed hyperlipidemia: Secondary | ICD-10-CM | POA: Diagnosis not present

## 2023-01-09 DIAGNOSIS — R7989 Other specified abnormal findings of blood chemistry: Secondary | ICD-10-CM

## 2023-01-09 DIAGNOSIS — R5383 Other fatigue: Secondary | ICD-10-CM

## 2023-01-09 NOTE — Progress Notes (Signed)
[facility] [address] [place], [place] [address]  Internal MEDICINE  Office Visit Note  Patient Name: Ashley Mckay  [id number]  [id number]  Date of Service: 01/17/2023  Chief Complaint  Patient presents with   Follow-up   Hypertension    HPI Pt is here for routine follow up -States she is doing well, no acute concerns -went to dentist recently and had eye exam -had shingles shot today, still due for next one in a few months -Will switch to oral B12, will recheck labs before CPE  Current Medication: Outpatient Encounter Medications as of 01/09/2023  Medication Sig Note   aspirin EC 81 MG tablet Take 81 mg by mouth daily. Swallow whole.    azithromycin (ZITHROMAX) 250 MG tablet Take 1 tablet (250 mg total) by mouth daily.    ciprofloxacin (CIPRO) 500 MG tablet Take 1 tablet (500 mg total) by mouth 2 (two) times daily.    Docusate Calcium (STOOL SOFTENER PO) Take by mouth.    ibuprofen (ADVIL,MOTRIN) 800 MG tablet 800 mg every 8 (eight) hours as needed.  06/21/2014: Received from: External Pharmacy   levocetirizine (XYZAL) 5 MG tablet Take 1 tablet (5 mg total) by mouth every evening.    Magnesium 250 MG TABS Take by mouth.    ondansetron (ZOFRAN) 4 MG tablet Take 1 tablet (4 mg total) by mouth every 8 (eight) hours as needed for nausea or vomiting.    oxybutynin (DITROPAN-XL) 10 MG 24 hr tablet Take 1 tablet (10 mg total) by mouth at bedtime.    propranolol ER (INDERAL LA) 80 MG 24 hr capsule Take 1 capsule (80 mg total) by mouth daily.    rosuvastatin (CRESTOR) 10 MG tablet Take 1 tablet (10 mg total) by mouth daily.    topiramate (TOPAMAX) 25 MG tablet TAKE ONE TABLET BY MOUTH ONE TIME DAILY    triamcinolone cream (KENALOG) 0.1 % Apply 1 application topically 2 (two) times daily.    VITAMIN D, CHOLECALCIFEROL, PO Take 1,000 Int'l Units by mouth daily.    No facility-administered encounter medications on file as of 01/09/2023.    Surgical History: Past Surgical  History:  Procedure Laterality Date   COLONOSCOPY     COLONOSCOPY WITH PROPOFOL N/A 01/31/2016   Procedure: COLONOSCOPY WITH PROPOFOL;  Surgeon: Scot Jun, MD;  Location: [facility] ENDOSCOPY;  Service: Endoscopy;  Laterality: N/A;   ESOPHAGOGASTRODUODENOSCOPY     tooth implant     started in 2020 but finished dec 2021    Medical History: Past Medical History:  Diagnosis Date   Arthritis    Headache    History of chickenpox    Hypertension    Sleep apnea     Family History: Family History  Problem Relation Age of Onset   Breast cancer Mother 72    Social History   Socioeconomic History   Marital status: Married    Spouse name: Not on file   Number of children: Not on file   Years of education: Not on file   Highest education level: Not on file  Occupational History   Not on file  Tobacco Use   Smoking status: Never   Smokeless tobacco: Never  Substance and Sexual Activity   Alcohol use: Not Currently    Comment: rarely   Drug use: No   Sexual activity: Not on file  Other Topics Concern   Not on file  Social History Narrative   Not on file   Social Determinants of  Health   Financial Resource Strain: Not on file  Food Insecurity: Not on file  Transportation Needs: Not on file  Physical Activity: Not on file  Stress: Not on file  Social Connections: Not on file  Intimate Partner Violence: Not on file      Review of Systems  Constitutional:  Negative for chills, fatigue and unexpected weight change.  HENT:  Positive for postnasal drip. Negative for congestion, rhinorrhea, sneezing and sore throat.   Eyes:  Negative for redness.  Respiratory:  Negative for cough, chest tightness and shortness of breath.   Cardiovascular:  Negative for chest pain and palpitations.  Gastrointestinal:  Negative for abdominal pain, constipation, diarrhea, nausea and vomiting.  Genitourinary:  Negative for dysuria and frequency.  Musculoskeletal:  Negative for arthralgias,  back pain, joint swelling and neck pain.  Skin:  Negative for rash.  Neurological: Negative.  Negative for tremors and numbness.  Hematological:  Negative for adenopathy. Does not bruise/bleed easily.  Psychiatric/Behavioral:  Negative for behavioral problems (Depression), sleep disturbance and suicidal ideas. The patient is not nervous/anxious.     Vital Signs: BP 130/70   Pulse 81   Temp 97.8 F (36.6 C)   Resp 16   Ht 5\' 2"  (1.575 m)   Wt 223 lb (101.2 kg)   LMP  (LMP Unknown)   SpO2 93%   BMI 40.79 kg/m    Physical Exam Vitals and nursing note reviewed.  Constitutional:      General: She is not in acute distress.    Appearance: She is well-developed. She is obese. She is not diaphoretic.  HENT:     Head: Normocephalic and atraumatic.     Mouth/Throat:     Pharynx: No oropharyngeal exudate.  Eyes:     Pupils: Pupils are equal, round, and reactive to light.  Neck:     Thyroid: No thyromegaly.     Vascular: No JVD.     Trachea: No tracheal deviation.  Cardiovascular:     Rate and Rhythm: Normal rate and regular rhythm.     Heart sounds: Normal heart sounds. No murmur heard.    No friction rub. No gallop.  Pulmonary:     Effort: Pulmonary effort is normal. No respiratory distress.     Breath sounds: No wheezing or rales.  Chest:     Chest wall: No tenderness.  Breasts:    Right: Normal. No mass.     Left: Normal. No mass.  Abdominal:     General: Bowel sounds are normal.     Palpations: Abdomen is soft.  Musculoskeletal:        General: Normal range of motion.     Cervical back: Normal range of motion and neck supple.  Lymphadenopathy:     Cervical: No cervical adenopathy.  Skin:    General: Skin is warm and dry.  Neurological:     Mental Status: She is alert and oriented to person, place, and time.     Cranial Nerves: No cranial nerve deficit.  Psychiatric:        Behavior: Behavior normal.        Thought Content: Thought content normal.         Judgment: Judgment normal.        Assessment/Plan: 1. Essential hypertension Stable, continue propranolol  2. B12 deficiency May switch to oral B12 now and will check labs before CPE - B12 and Folate Panel  3. Vitamin D deficiency - VITAMIN D 25 Hydroxy (Vit-D Deficiency, Fractures)  4. Mixed hyperlipidemia - Lipid Panel With LDL/HDL Ratio  5. Abnormal thyroid blood test - TSH + free T4  6. Other fatigue - CBC w/Diff/Platelet - Comprehensive metabolic panel - TSH + free T4 - Lipid Panel With LDL/HDL Ratio - B12 and Folate Panel - VITAMIN D 25 Hydroxy (Vit-D Deficiency, Fractures)   General Counseling: Annaston verbalizes understanding of the findings of todays visit and agrees with plan of treatment. I have discussed any further diagnostic evaluation that may be needed or ordered today. We also reviewed her medications today. she has been encouraged to call the office with any questions or concerns that should arise related to todays visit.    Orders Placed This Encounter  Procedures   CBC w/Diff/Platelet   Comprehensive metabolic panel   TSH + free T4   Lipid Panel With LDL/HDL Ratio   B12 and Folate Panel   VITAMIN D 25 Hydroxy (Vit-D Deficiency, Fractures)    No orders of the defined types were placed in this encounter.   This patient was seen by Lynn Ito, PA-C in collaboration with Dr. Beverely Risen as a part of collaborative care agreement.   Total time spent:30 Minutes Time spent includes review of chart, medications, test results, and follow up plan with the patient.      Dr Lyndon Code Internal medicine

## 2023-02-28 DIAGNOSIS — G4733 Obstructive sleep apnea (adult) (pediatric): Secondary | ICD-10-CM | POA: Diagnosis not present

## 2023-04-29 ENCOUNTER — Other Ambulatory Visit
Admission: RE | Admit: 2023-04-29 | Discharge: 2023-04-29 | Disposition: A | Discharge status: Home / Self Care | Payer: Medicare HMO | Source: Ambulatory Visit | Attending: Physician Assistant | Admitting: Physician Assistant

## 2023-04-29 DIAGNOSIS — E782 Mixed hyperlipidemia: Secondary | ICD-10-CM | POA: Diagnosis not present

## 2023-04-29 DIAGNOSIS — E559 Vitamin D deficiency, unspecified: Secondary | ICD-10-CM | POA: Diagnosis not present

## 2023-04-29 DIAGNOSIS — R7989 Other specified abnormal findings of blood chemistry: Secondary | ICD-10-CM | POA: Insufficient documentation

## 2023-04-29 DIAGNOSIS — E538 Deficiency of other specified B group vitamins: Secondary | ICD-10-CM | POA: Insufficient documentation

## 2023-04-29 DIAGNOSIS — R5383 Other fatigue: Secondary | ICD-10-CM | POA: Insufficient documentation

## 2023-04-29 LAB — COMPREHENSIVE METABOLIC PANEL
ALT: 14 U/L (ref 0–44)
AST: 21 U/L (ref 15–41)
Albumin: 4.2 g/dL (ref 3.5–5.0)
Alkaline Phosphatase: 78 U/L (ref 38–126)
Anion gap: 11 (ref 5–15)
BUN: 15 mg/dL (ref 8–23)
CO2: 24 mmol/L (ref 22–32)
Calcium: 9 mg/dL (ref 8.9–10.3)
Chloride: 104 mmol/L (ref 98–111)
Creatinine, Ser: 0.65 mg/dL (ref 0.44–1.00)
GFR, Estimated: >60 mL/min (ref >60)
Glucose, Bld: 103 mg/dL — ABNORMAL HIGH (ref 70–99)
Potassium: 3.8 mmol/L (ref 3.5–5.1)
Sodium: 139 mmol/L (ref 135–145)
Total Bilirubin: 0.8 mg/dL (ref 0.3–1.2)
Total Protein: 8.1 g/dL (ref 6.5–8.1)

## 2023-04-29 LAB — CBC WITH DIFFERENTIAL/PLATELET
Abs Immature Granulocytes: 0.02 10*3/uL (ref 0.00–0.07)
Basophils Absolute: 0 10*3/uL (ref 0.0–0.1)
Basophils Relative: 1 %
Eosinophils Absolute: 0.2 10*3/uL (ref 0.0–0.5)
Eosinophils Relative: 3 %
HCT: 43.8 % (ref 36.0–46.0)
Hemoglobin: 14.3 g/dL (ref 12.0–15.0)
Immature Granulocytes: 0 %
Lymphocytes Relative: 20 %
Lymphs Abs: 1.3 10*3/uL (ref 0.7–4.0)
MCH: 30.8 pg (ref 26.0–34.0)
MCHC: 32.6 g/dL (ref 30.0–36.0)
MCV: 94.4 fL (ref 80.0–100.0)
Monocytes Absolute: 0.6 10*3/uL (ref 0.1–1.0)
Monocytes Relative: 8 %
Neutro Abs: 4.6 10*3/uL (ref 1.7–7.7)
Neutrophils Relative %: 68 %
Platelets: 271 10*3/uL (ref 150–400)
RBC: 4.64 MIL/uL (ref 3.87–5.11)
RDW: 13 % (ref 11.5–15.5)
WBC: 6.8 10*3/uL (ref 4.0–10.5)
nRBC: 0 % (ref 0.0–0.2)

## 2023-04-29 LAB — LIPID PANEL
Cholesterol: 166 mg/dL (ref 0–200)
HDL: 59 mg/dL (ref >40)
LDL Cholesterol: 77 mg/dL (ref 0–99)
Total CHOL/HDL Ratio: 2.8 RATIO
Triglycerides: 150 mg/dL — ABNORMAL HIGH (ref <150)
VLDL: 30 mg/dL (ref 0–40)

## 2023-04-29 LAB — VITAMIN B12: Vitamin B-12: 256 pg/mL (ref 180–914)

## 2023-04-29 LAB — VITAMIN D 25 HYDROXY (VIT D DEFICIENCY, FRACTURES): Vit D, 25-Hydroxy: 49.1 ng/mL (ref 30–100)

## 2023-04-29 LAB — FOLATE: Folate: 24 ng/mL (ref >5.9)

## 2023-04-29 LAB — T4, FREE: Free T4: 0.82 ng/dL (ref 0.61–1.12)

## 2023-04-29 LAB — TSH: TSH: 1.815 u[IU]/mL (ref 0.350–4.500)

## 2023-05-09 ENCOUNTER — Ambulatory Visit (INDEPENDENT_AMBULATORY_CARE_PROVIDER_SITE_OTHER): Payer: Medicare HMO | Admitting: Physician Assistant

## 2023-05-09 ENCOUNTER — Encounter: Payer: Self-pay | Admitting: Physician Assistant

## 2023-05-09 VITALS — BP 130/80 | HR 81 | Temp 97.7°F | Resp 16 | Ht 62.0 in | Wt 225.2 lb

## 2023-05-09 DIAGNOSIS — I10 Essential (primary) hypertension: Secondary | ICD-10-CM | POA: Diagnosis not present

## 2023-05-09 DIAGNOSIS — E538 Deficiency of other specified B group vitamins: Secondary | ICD-10-CM | POA: Diagnosis not present

## 2023-05-09 DIAGNOSIS — E782 Mixed hyperlipidemia: Secondary | ICD-10-CM | POA: Diagnosis not present

## 2023-05-09 DIAGNOSIS — J301 Allergic rhinitis due to pollen: Secondary | ICD-10-CM

## 2023-05-09 DIAGNOSIS — Z0001 Encounter for general adult medical examination with abnormal findings: Secondary | ICD-10-CM

## 2023-05-09 DIAGNOSIS — R300 Dysuria: Secondary | ICD-10-CM

## 2023-05-09 DIAGNOSIS — R32 Unspecified urinary incontinence: Secondary | ICD-10-CM | POA: Diagnosis not present

## 2023-05-09 DIAGNOSIS — Z1231 Encounter for screening mammogram for malignant neoplasm of breast: Secondary | ICD-10-CM | POA: Diagnosis not present

## 2023-05-09 MED ORDER — ROSUVASTATIN CALCIUM 10 MG PO TABS: 10.0000 mg | ORAL_TABLET | Freq: Every day | ORAL | 3 refills | Status: DC

## 2023-05-09 MED ORDER — LEVOCETIRIZINE DIHYDROCHLORIDE 5 MG PO TABS
5.0000 mg | ORAL_TABLET | Freq: Every evening | ORAL | 3 refills | Status: DC
Start: 2023-05-09 — End: 2024-05-02

## 2023-05-09 MED ORDER — OXYBUTYNIN CHLORIDE ER 10 MG PO TB24
10.0000 mg | ORAL_TABLET | Freq: Every day | ORAL | 1 refills | Status: DC
Start: 2023-05-09 — End: 2023-11-04

## 2023-05-09 MED ORDER — PROPRANOLOL HCL ER 80 MG PO CP24
80.0000 mg | ORAL_CAPSULE | Freq: Every day | ORAL | 3 refills | Status: DC
Start: 2023-05-09 — End: 2024-04-26

## 2023-05-09 NOTE — Progress Notes (Signed)
[facility] [address] [place], [place] [address]  Internal MEDICINE  Office Visit Note  Patient Name: Ashley Mckay  [id number]  [id number]  Date of Service: 05/20/2023  Chief Complaint  Patient presents with   Medicare Wellness   Hypertension    HPI [name] presents for an annual well visit and physical exam.  Well-appearing 72 y.o. female Routine CRC screening: UTD Routine mammogram: Due in Sept DEXA scan: Done in 2015 Eye exam: UTD, also seeing dentist on the 5th Labs: b12 still a little low, TG borderline, glucose borderline Other concerns: Shingles vaccines done and will send dates.     05/09/2023    8:50 AM 05/03/2022    9:13 AM 04/26/2021   10:00 AM  MMSE - Mini Mental State Exam  Orientation to time 5 5 5   Orientation to Place 5 5 5   Registration 3 3 3   Attention/ Calculation 5 5 5   Recall 3 3 3   Language- name 2 objects 2 2 2   Language- repeat 1 1 1   Language- follow 3 step command 3 3 3   Language- read & follow direction 1 1 1   Write a sentence 1 1 1   Copy design 1 1 1   Total score 30 30 30     Functional Status Survey: Is the patient deaf or have difficulty hearing?: No Does the patient have difficulty seeing, even when wearing glasses/contacts?: No Does the patient have difficulty concentrating, remembering, or making decisions?: No Does the patient have difficulty walking or climbing stairs?: No Does the patient have difficulty dressing or bathing?: No Does the patient have difficulty doing errands alone such as visiting a doctor's office or shopping?: No     02/07/2022   11:21 AM 05/03/2022    9:15 AM 09/06/2022   10:39 AM 01/09/2023    2:00 PM 05/09/2023    8:49 AM  Fall Risk  Falls in the past year? 0 0 0 0 0  Was there an injury with Fall?   0    Fall Risk Category Calculator   0    Fall Risk Category (Retired)   Low    (RETIRED) Patient Fall Risk Level  Low fall risk Low fall risk    Patient at Risk for Falls Due to No Fall Risks  No Fall Risks No Fall Risks    Fall risk Follow up Falls evaluation completed Falls evaluation completed Falls evaluation completed         05/09/2023    8:49 AM  Depression screen PHQ 2/9  Decreased Interest 0  Down, Depressed, Hopeless 0  PHQ - 2 Score 0        No data to display            Current Medication: Outpatient Encounter Medications as of 05/09/2023  Medication Sig Note   aspirin EC 81 MG tablet Take 81 mg by mouth daily. Swallow whole.    Docusate Calcium (STOOL SOFTENER PO) Take by mouth.    ibuprofen (ADVIL,MOTRIN) 800 MG tablet 800 mg every 8 (eight) hours as needed.  06/21/2014: Received from: External Pharmacy   Magnesium 250 MG TABS Take by mouth.    ondansetron (ZOFRAN) 4 MG tablet Take 1 tablet (4 mg total) by mouth every 8 (eight) hours as needed for nausea or vomiting.    triamcinolone cream (KENALOG) 0.1 % Apply 1 application topically 2 (two) times daily.    VITAMIN D, CHOLECALCIFEROL, PO Take 1,000 Int'l Units by mouth daily.    [  DISCONTINUED] azithromycin (ZITHROMAX) 250 MG tablet Take 1 tablet (250 mg total) by mouth daily.    [DISCONTINUED] ciprofloxacin (CIPRO) 500 MG tablet Take 1 tablet (500 mg total) by mouth 2 (two) times daily.    [DISCONTINUED] levocetirizine (XYZAL) 5 MG tablet Take 1 tablet (5 mg total) by mouth every evening.    [DISCONTINUED] oxybutynin (DITROPAN-XL) 10 MG 24 hr tablet Take 1 tablet (10 mg total) by mouth at bedtime.    [DISCONTINUED] propranolol ER (INDERAL LA) 80 MG 24 hr capsule Take 1 capsule (80 mg total) by mouth daily.    [DISCONTINUED] rosuvastatin (CRESTOR) 10 MG tablet Take 1 tablet (10 mg total) by mouth daily.    [DISCONTINUED] topiramate (TOPAMAX) 25 MG tablet TAKE ONE TABLET BY MOUTH ONE TIME DAILY    levocetirizine (XYZAL) 5 MG tablet Take 1 tablet (5 mg total) by mouth every evening.    oxybutynin (DITROPAN-XL) 10 MG 24 hr tablet Take 1 tablet (10 mg total) by mouth at bedtime.    propranolol ER (INDERAL  LA) 80 MG 24 hr capsule Take 1 capsule (80 mg total) by mouth daily.    rosuvastatin (CRESTOR) 10 MG tablet Take 1 tablet (10 mg total) by mouth daily.    No facility-administered encounter medications on file as of 05/09/2023.    Surgical History: Past Surgical History:  Procedure Laterality Date   COLONOSCOPY     COLONOSCOPY WITH PROPOFOL N/A 01/31/2016   Procedure: COLONOSCOPY WITH PROPOFOL;  Surgeon: Scot Jun, MD;  Location: [facility] ENDOSCOPY;  Service: Endoscopy;  Laterality: N/A;   ESOPHAGOGASTRODUODENOSCOPY     tooth implant     started in 2020 but finished dec 2021    Medical History: Past Medical History:  Diagnosis Date   Arthritis    Headache    History of chickenpox    Hypertension    Sleep apnea     Family History: Family History  Problem Relation Age of Onset   Breast cancer Mother 82    Social History   Socioeconomic History   Marital status: Married    Spouse name: Not on file   Number of children: Not on file   Years of education: Not on file   Highest education level: Not on file  Occupational History   Not on file  Tobacco Use   Smoking status: Never   Smokeless tobacco: Never  Substance and Sexual Activity   Alcohol use: Not Currently    Comment: rarely   Drug use: No   Sexual activity: Not on file  Other Topics Concern   Not on file  Social History Narrative   Not on file   Social Determinants of Health   Financial Resource Strain: Not on file  Food Insecurity: Not on file  Transportation Needs: Not on file  Physical Activity: Not on file  Stress: Not on file  Social Connections: Not on file  Intimate Partner Violence: Not on file      Review of Systems  Constitutional:  Negative for chills, fatigue and unexpected weight change.  HENT:  Positive for postnasal drip. Negative for congestion, rhinorrhea, sneezing and sore throat.   Eyes:  Negative for redness.  Respiratory:  Negative for cough, chest tightness and shortness  of breath.   Cardiovascular:  Negative for chest pain and palpitations.  Gastrointestinal:  Negative for abdominal pain, constipation, diarrhea, nausea and vomiting.  Genitourinary:  Negative for dysuria and frequency.  Musculoskeletal:  Negative for arthralgias, back pain, joint swelling and neck  pain.  Skin:  Negative for rash.  Neurological: Negative.  Negative for tremors and numbness.  Hematological:  Negative for adenopathy. Does not bruise/bleed easily.  Psychiatric/Behavioral:  Negative for behavioral problems (Depression), sleep disturbance and suicidal ideas. The patient is not nervous/anxious.     Vital Signs: BP 130/80   Pulse 81   Temp 97.7 F (36.5 C)   Resp 16   Ht 5\' 2"  (1.575 m)   Wt 225 lb 3.2 oz (102.2 kg)   LMP  (LMP Unknown)   SpO2 93%   BMI 41.19 kg/m    Physical Exam Vitals and nursing note reviewed.  Constitutional:      General: She is not in acute distress.    Appearance: She is well-developed. She is obese. She is not diaphoretic.  HENT:     Head: Normocephalic and atraumatic.     Mouth/Throat:     Pharynx: No oropharyngeal exudate.  Eyes:     Pupils: Pupils are equal, round, and reactive to light.  Neck:     Thyroid: No thyromegaly.     Vascular: No JVD.     Trachea: No tracheal deviation.  Cardiovascular:     Rate and Rhythm: Normal rate and regular rhythm.     Heart sounds: Normal heart sounds. No murmur heard.    No friction rub. No gallop.  Pulmonary:     Effort: Pulmonary effort is normal. No respiratory distress.     Breath sounds: No wheezing or rales.  Chest:     Chest wall: No tenderness.  Breasts:    Right: Normal. No mass.     Left: Normal. No mass.  Abdominal:     General: Bowel sounds are normal.     Palpations: Abdomen is soft.  Musculoskeletal:        General: Normal range of motion.     Cervical back: Normal range of motion and neck supple.  Lymphadenopathy:     Cervical: No cervical adenopathy.  Skin:     General: Skin is warm and dry.  Neurological:     Mental Status: She is alert and oriented to person, place, and time.     Cranial Nerves: No cranial nerve deficit.  Psychiatric:        Behavior: Behavior normal.        Thought Content: Thought content normal.        Judgment: Judgment normal.        Assessment/Plan: 1. Encounter for general adult medical examination with abnormal findings CPE performed, labs reviewed, PHM UTD  2. Essential hypertension - propranolol ER (INDERAL LA) 80 MG 24 hr capsule; Take 1 capsule (80 mg total) by mouth daily.  Dispense: 90 capsule; Refill: 3  3. Visit for screening mammogram - MM 3D SCREENING MAMMOGRAM BILATERAL BREAST; Future  4. Mixed hyperlipidemia - rosuvastatin (CRESTOR) 10 MG tablet; Take 1 tablet (10 mg total) by mouth daily.  Dispense: 90 tablet; Refill: 3  5. Non-seasonal allergic rhinitis due to pollen - levocetirizine (XYZAL) 5 MG tablet; Take 1 tablet (5 mg total) by mouth every evening.  Dispense: 90 tablet; Refill: 3  6. Urinary incontinence, unspecified type - oxybutynin (DITROPAN-XL) 10 MG 24 hr tablet; Take 1 tablet (10 mg total) by mouth at bedtime.  Dispense: 90 tablet; Refill: 1  7. Dysuria - UA/M w/rflx Culture, Routine  8. B12 deficiency Start B12 shots    General Counseling: Jadis verbalizes understanding of the findings of todays visit and agrees with plan of treatment.  I have discussed any further diagnostic evaluation that may be needed or ordered today. We also reviewed her medications today. she has been encouraged to call the office with any questions or concerns that should arise related to todays visit.    Orders Placed This Encounter  Procedures   Microscopic Examination   MM 3D SCREENING MAMMOGRAM BILATERAL BREAST   UA/M w/rflx Culture, Routine    Meds ordered this encounter  Medications   propranolol ER (INDERAL LA) 80 MG 24 hr capsule    Sig: Take 1 capsule (80 mg total) by mouth daily.     Dispense:  90 capsule    Refill:  3   rosuvastatin (CRESTOR) 10 MG tablet    Sig: Take 1 tablet (10 mg total) by mouth daily.    Dispense:  90 tablet    Refill:  3   levocetirizine (XYZAL) 5 MG tablet    Sig: Take 1 tablet (5 mg total) by mouth every evening.    Dispense:  90 tablet    Refill:  3   oxybutynin (DITROPAN-XL) 10 MG 24 hr tablet    Sig: Take 1 tablet (10 mg total) by mouth at bedtime.    Dispense:  90 tablet    Refill:  1    Return in about 4 months (around 09/08/2023) for general follow up, needs b12 shots.   Total time spent:35 Minutes Time spent includes review of chart, medications, test results, and follow up plan with the patient.   [place] Controlled Substance Database was reviewed by me.  This patient was seen by Lynn Ito, PA-C in collaboration with Dr. Beverely Risen as a part of collaborative care agreement.  Lynn Ito, PA-C Internal medicine

## 2023-05-10 LAB — MICROSCOPIC EXAMINATION
Bacteria, UA: NONE SEEN
Casts: NONE SEEN /LPF
WBC, UA: NONE SEEN /HPF (ref 0–5)

## 2023-05-10 LAB — UA/M W/RFLX CULTURE, ROUTINE
Bilirubin, UA: NEGATIVE
Glucose, UA: NEGATIVE
Ketones, UA: NEGATIVE
Leukocytes,UA: NEGATIVE
Nitrite, UA: NEGATIVE
Protein,UA: NEGATIVE
RBC, UA: NEGATIVE
Specific Gravity, UA: 1.008 (ref 1.005–1.030)
Urobilinogen, Ur: 0.2 mg/dL (ref 0.2–1.0)
pH, UA: 6.5 (ref 5.0–7.5)

## 2023-05-13 ENCOUNTER — Telehealth: Payer: Self-pay

## 2023-05-13 ENCOUNTER — Other Ambulatory Visit: Payer: Self-pay

## 2023-05-13 NOTE — Telephone Encounter (Addendum)
Spoke with pt regarding UA result that urine microscopic blood in urine as per lauren we can repeat UA and pt had B 12 appt on Friday she will give [place] urine specimen and we can send to [facility]

## 2023-05-16 ENCOUNTER — Ambulatory Visit: Payer: Medicare HMO

## 2023-05-16 DIAGNOSIS — E538 Deficiency of other specified B group vitamins: Secondary | ICD-10-CM | POA: Diagnosis not present

## 2023-05-16 DIAGNOSIS — R300 Dysuria: Secondary | ICD-10-CM | POA: Diagnosis not present

## 2023-05-16 MED ORDER — CYANOCOBALAMIN 1000 MCG/ML IJ SOLN
1000.0000 ug | Freq: Once | INTRAMUSCULAR | Status: AC
Start: 2023-05-16 — End: 2023-05-16
  Administered 2023-05-16: 1000 ug via INTRAMUSCULAR

## 2023-05-17 LAB — UA/M W/RFLX CULTURE, ROUTINE
Bilirubin, UA: NEGATIVE
Glucose, UA: NEGATIVE
Ketones, UA: NEGATIVE
Leukocytes,UA: NEGATIVE
Nitrite, UA: NEGATIVE
Protein,UA: NEGATIVE
RBC, UA: NEGATIVE
Specific Gravity, UA: 1.014 (ref 1.005–1.030)
Urobilinogen, Ur: 0.2 mg/dL (ref 0.2–1.0)
pH, UA: 6 (ref 5.0–7.5)

## 2023-05-17 LAB — MICROSCOPIC EXAMINATION
Bacteria, UA: NONE SEEN
Casts: NONE SEEN /LPF
RBC, Urine: NONE SEEN /HPF (ref 0–2)
WBC, UA: NONE SEEN /hpf (ref 0–5)

## 2023-05-23 ENCOUNTER — Ambulatory Visit (INDEPENDENT_AMBULATORY_CARE_PROVIDER_SITE_OTHER): Payer: Medicare HMO

## 2023-05-23 DIAGNOSIS — E538 Deficiency of other specified B group vitamins: Secondary | ICD-10-CM

## 2023-05-23 MED ORDER — CYANOCOBALAMIN 1000 MCG/ML IJ SOLN
1000.0000 ug | Freq: Once | INTRAMUSCULAR | Status: AC
Start: 2023-05-23 — End: 2023-05-23
  Administered 2023-05-23: 1000 ug via INTRAMUSCULAR

## 2023-06-03 DIAGNOSIS — G4733 Obstructive sleep apnea (adult) (pediatric): Secondary | ICD-10-CM | POA: Diagnosis not present

## 2023-06-04 ENCOUNTER — Ambulatory Visit
Admission: RE | Admit: 2023-06-04 | Discharge: 2023-06-04 | Disposition: A | Discharge status: Home / Self Care | Payer: Medicare HMO | Source: Ambulatory Visit | Attending: Physician Assistant | Admitting: Physician Assistant

## 2023-06-04 DIAGNOSIS — Z1231 Encounter for screening mammogram for malignant neoplasm of breast: Secondary | ICD-10-CM | POA: Diagnosis not present

## 2023-06-20 ENCOUNTER — Ambulatory Visit (INDEPENDENT_AMBULATORY_CARE_PROVIDER_SITE_OTHER): Payer: Medicare HMO

## 2023-06-20 DIAGNOSIS — E538 Deficiency of other specified B group vitamins: Secondary | ICD-10-CM | POA: Diagnosis not present

## 2023-06-20 MED ORDER — CYANOCOBALAMIN 1000 MCG/ML IJ SOLN
1000.0000 ug | Freq: Once | INTRAMUSCULAR | Status: AC
Start: 2023-06-20 — End: 2023-06-20
  Administered 2023-06-20: 1000 ug via INTRAMUSCULAR

## 2023-07-07 ENCOUNTER — Encounter: Payer: Self-pay | Admitting: Physician Assistant

## 2023-07-07 ENCOUNTER — Ambulatory Visit (INDEPENDENT_AMBULATORY_CARE_PROVIDER_SITE_OTHER): Payer: Medicare HMO | Admitting: Physician Assistant

## 2023-07-07 VITALS — BP 130/70 | HR 77 | Temp 97.8°F | Resp 16 | Ht 62.0 in | Wt 223.0 lb

## 2023-07-07 DIAGNOSIS — Z6841 Body Mass Index (BMI) 40.0 and over, adult: Secondary | ICD-10-CM

## 2023-07-07 DIAGNOSIS — I10 Essential (primary) hypertension: Secondary | ICD-10-CM

## 2023-07-07 DIAGNOSIS — G4733 Obstructive sleep apnea (adult) (pediatric): Secondary | ICD-10-CM | POA: Diagnosis not present

## 2023-07-07 MED ORDER — TIRZEPATIDE-WEIGHT MANAGEMENT 2.5 MG/0.5ML ~~LOC~~ SOLN
2.5000 mg | SUBCUTANEOUS | 2 refills | Status: DC
Start: 2023-07-07 — End: 2023-07-24

## 2023-07-07 NOTE — Progress Notes (Signed)
[facility] [address] [place], [place] [address]  Internal MEDICINE  Office Visit Note  Patient Name: Ashley Mckay  [id number]  [id number]  Date of Service: 07/07/2023  Chief Complaint  Patient presents with   Follow-up   Hypertension    HPI Pt is here for routine follow up -dentist gave new toothpaste to help with effects of dry mouth -mammogram updated -flu shot done -Wants to discuss weight loss medications, interested in injections. Pt would be ok with OOP, has coupon and may try this. -No personal hx or FHx of thyroid cancer   Current Medication: Outpatient Encounter Medications as of 07/07/2023  Medication Sig Note   aspirin EC 81 MG tablet Take 81 mg by mouth daily. Swallow whole.    Docusate Calcium (STOOL SOFTENER PO) Take by mouth.    ibuprofen (ADVIL,MOTRIN) 800 MG tablet 800 mg every 8 (eight) hours as needed.  06/21/2014: Received from: External Pharmacy   levocetirizine (XYZAL) 5 MG tablet Take 1 tablet (5 mg total) by mouth every evening.    Magnesium 250 MG TABS Take by mouth.    ondansetron (ZOFRAN) 4 MG tablet Take 1 tablet (4 mg total) by mouth every 8 (eight) hours as needed for nausea or vomiting.    oxybutynin (DITROPAN-XL) 10 MG 24 hr tablet Take 1 tablet (10 mg total) by mouth at bedtime.    propranolol ER (INDERAL LA) 80 MG 24 hr capsule Take 1 capsule (80 mg total) by mouth daily.    rosuvastatin (CRESTOR) 10 MG tablet Take 1 tablet (10 mg total) by mouth daily.    tirzepatide (ZEPBOUND) 2.5 MG/0.5ML injection vial Inject 2.5 mg into the skin once a week.    triamcinolone cream (KENALOG) 0.1 % Apply 1 application topically 2 (two) times daily.    VITAMIN D, CHOLECALCIFEROL, PO Take 1,000 Int'l Units by mouth daily.    No facility-administered encounter medications on file as of 07/07/2023.    Surgical History: Past Surgical History:  Procedure Laterality Date   COLONOSCOPY     COLONOSCOPY WITH PROPOFOL N/A 01/31/2016    Procedure: COLONOSCOPY WITH PROPOFOL;  Surgeon: Scot Jun, MD;  Location: [facility] ENDOSCOPY;  Service: Endoscopy;  Laterality: N/A;   ESOPHAGOGASTRODUODENOSCOPY     tooth implant     started in 2020 but finished dec 2021    Medical History: Past Medical History:  Diagnosis Date   Arthritis    Headache    History of chickenpox    Hypertension    Sleep apnea     Family History: Family History  Problem Relation Age of Onset   Breast cancer Mother 63    Social History   Socioeconomic History   Marital status: Married    Spouse name: Not on file   Number of children: Not on file   Years of education: Not on file   Highest education level: Not on file  Occupational History   Not on file  Tobacco Use   Smoking status: Never   Smokeless tobacco: Never  Substance and Sexual Activity   Alcohol use: Not Currently    Comment: rarely   Drug use: No   Sexual activity: Not on file  Other Topics Concern   Not on file  Social History Narrative   Not on file   Social Determinants of Health   Financial Resource Strain: Not on file  Food Insecurity: Not on file  Transportation Needs: Not on file  Physical Activity: Not on file  Stress: Not on file  Social Connections: Not on file  Intimate Partner Violence: Not on file      Review of Systems  Constitutional:  Negative for chills, fatigue and unexpected weight change.  HENT:  Positive for postnasal drip. Negative for congestion, rhinorrhea, sneezing and sore throat.   Eyes:  Negative for redness.  Respiratory:  Negative for cough, chest tightness and shortness of breath.   Cardiovascular:  Negative for chest pain and palpitations.  Gastrointestinal:  Negative for abdominal pain, constipation, diarrhea, nausea and vomiting.  Genitourinary:  Negative for dysuria and frequency.  Musculoskeletal:  Negative for arthralgias, back pain, joint swelling and neck pain.  Skin:  Negative for rash.  Neurological: Negative.   Negative for tremors and numbness.  Hematological:  Negative for adenopathy. Does not bruise/bleed easily.  Psychiatric/Behavioral:  Negative for behavioral problems (Depression), sleep disturbance and suicidal ideas. The patient is not nervous/anxious.     Vital Signs: BP 130/70   Pulse 77   Temp 97.8 F (36.6 C)   Resp 16   Ht 5\' 2"  (1.575 m)   Wt 223 lb (101.2 kg)   LMP  (LMP Unknown)   SpO2 94%   BMI 40.79 kg/m    Physical Exam Vitals and nursing note reviewed.  Constitutional:      General: She is not in acute distress.    Appearance: She is well-developed. She is obese. She is not diaphoretic.  HENT:     Head: Normocephalic and atraumatic.     Mouth/Throat:     Pharynx: No oropharyngeal exudate.  Eyes:     Pupils: Pupils are equal, round, and reactive to light.  Neck:     Thyroid: No thyromegaly.     Vascular: No JVD.     Trachea: No tracheal deviation.  Cardiovascular:     Rate and Rhythm: Normal rate and regular rhythm.     Heart sounds: Normal heart sounds. No murmur heard.    No friction rub. No gallop.  Pulmonary:     Effort: Pulmonary effort is normal. No respiratory distress.     Breath sounds: No wheezing or rales.  Chest:     Chest wall: No tenderness.  Breasts:    Right: Normal. No mass.     Left: Normal. No mass.  Abdominal:     General: Bowel sounds are normal.     Palpations: Abdomen is soft.  Musculoskeletal:        General: Normal range of motion.     Cervical back: Normal range of motion and neck supple.  Lymphadenopathy:     Cervical: No cervical adenopathy.  Skin:    General: Skin is warm and dry.  Neurological:     Mental Status: She is alert and oriented to person, place, and time.     Cranial Nerves: No cranial nerve deficit.  Psychiatric:        Behavior: Behavior normal.        Thought Content: Thought content normal.        Judgment: Judgment normal.        Assessment/Plan: 1. Morbid obesity with BMI of 40.0-44.9,  adult (HCC) Will start on zepbound to aid in weight loss efforts while pt continues to work on diet and exercise - tirzepatide (ZEPBOUND) 2.5 MG/0.5ML injection vial; Inject 2.5 mg into the skin once a week.  Dispense: 2 mL; Refill: 2  2. Essential hypertension Stable, continue current medications  3. OSA (obstructive sleep apnea) Continue cpap nightly  General Counseling: Verlena verbalizes understanding of the findings of todays visit and agrees with plan of treatment. I have discussed any further diagnostic evaluation that may be needed or ordered today. We also reviewed her medications today. she has been encouraged to call the office with any questions or concerns that should arise related to todays visit.    No orders of the defined types were placed in this encounter.   Meds ordered this encounter  Medications   tirzepatide (ZEPBOUND) 2.5 MG/0.5ML injection vial    Sig: Inject 2.5 mg into the skin once a week.    Dispense:  2 mL    Refill:  2    This patient was seen by Lynn Ito, PA-C in collaboration with Dr. Beverely Risen as a part of collaborative care agreement.   Total time spent:30 Minutes Time spent includes review of chart, medications, test results, and follow up plan with the patient.      Dr Lyndon Code Internal medicine

## 2023-07-18 ENCOUNTER — Ambulatory Visit (INDEPENDENT_AMBULATORY_CARE_PROVIDER_SITE_OTHER): Payer: Medicare HMO

## 2023-07-18 DIAGNOSIS — E538 Deficiency of other specified B group vitamins: Secondary | ICD-10-CM | POA: Diagnosis not present

## 2023-07-18 MED ORDER — CYANOCOBALAMIN 1000 MCG/ML IJ SOLN
1000.0000 ug | Freq: Once | INTRAMUSCULAR | Status: AC
Start: 2023-07-18 — End: 2023-07-18
  Administered 2023-07-18: 1000 ug via INTRAMUSCULAR

## 2023-07-22 DIAGNOSIS — G4733 Obstructive sleep apnea (adult) (pediatric): Secondary | ICD-10-CM | POA: Diagnosis not present

## 2023-07-24 ENCOUNTER — Encounter: Payer: Self-pay | Admitting: Physician Assistant

## 2023-07-24 ENCOUNTER — Ambulatory Visit (INDEPENDENT_AMBULATORY_CARE_PROVIDER_SITE_OTHER): Payer: Medicare HMO | Admitting: Physician Assistant

## 2023-07-24 VITALS — BP 118/85 | HR 86 | Temp 98.3°F | Resp 16 | Ht 62.0 in | Wt 225.4 lb

## 2023-07-24 DIAGNOSIS — Z6841 Body Mass Index (BMI) 40.0 and over, adult: Secondary | ICD-10-CM | POA: Diagnosis not present

## 2023-07-24 NOTE — Progress Notes (Signed)
[facility] [address] [place], [place] [address]  Internal MEDICINE  Office Visit Note  Patient Name: Ashley Mckay  [id number]  [id number]  Date of Service: 07/30/2023  Chief Complaint  Patient presents with   Follow-up   Weight Loss   Hypertension    HPI Pt is here for routine follow up for wt management -Would like to try alternative GLP1, insurance does not cover and OOP was over 1000 at local pharmacy -Would like to do the zepbound cashpay option via [employer]. Pt aware of costs of cashpay and would like to proceed.  Current Medication: Outpatient Encounter Medications as of 07/24/2023  Medication Sig Note   aspirin EC 81 MG tablet Take 81 mg by mouth daily. Swallow whole.    Docusate Calcium (STOOL SOFTENER PO) Take by mouth.    ibuprofen (ADVIL,MOTRIN) 800 MG tablet 800 mg every 8 (eight) hours as needed.  06/21/2014: Received from: External Pharmacy   levocetirizine (XYZAL) 5 MG tablet Take 1 tablet (5 mg total) by mouth every evening.    Magnesium 250 MG TABS Take by mouth.    ondansetron (ZOFRAN) 4 MG tablet Take 1 tablet (4 mg total) by mouth every 8 (eight) hours as needed for nausea or vomiting.    oxybutynin (DITROPAN-XL) 10 MG 24 hr tablet Take 1 tablet (10 mg total) by mouth at bedtime.    propranolol ER (INDERAL LA) 80 MG 24 hr capsule Take 1 capsule (80 mg total) by mouth daily.    rosuvastatin (CRESTOR) 10 MG tablet Take 1 tablet (10 mg total) by mouth daily.    tirzepatide (ZEPBOUND) 2.5 MG/0.5ML injection vial Inject 2.5 mg into the skin once a week.    tirzepatide 5 MG/0.5ML injection vial Inject 5 mg into the skin once a week.    triamcinolone cream (KENALOG) 0.1 % Apply 1 application topically 2 (two) times daily.    VITAMIN D, CHOLECALCIFEROL, PO Take 1,000 Int'l Units by mouth daily.    [DISCONTINUED] tirzepatide (ZEPBOUND) 2.5 MG/0.5ML injection vial Inject 2.5 mg into the skin once a week.    No facility-administered encounter medications  on file as of 07/24/2023.    Surgical History: Past Surgical History:  Procedure Laterality Date   COLONOSCOPY     COLONOSCOPY WITH PROPOFOL N/A 01/31/2016   Procedure: COLONOSCOPY WITH PROPOFOL;  Surgeon: Scot Jun, MD;  Location: [facility] ENDOSCOPY;  Service: Endoscopy;  Laterality: N/A;   ESOPHAGOGASTRODUODENOSCOPY     tooth implant     started in 2020 but finished dec 2021    Medical History: Past Medical History:  Diagnosis Date   Arthritis    Headache    History of chickenpox    Hypertension    Sleep apnea     Family History: Family History  Problem Relation Age of Onset   Breast cancer Mother 32    Social History   Socioeconomic History   Marital status: Married    Spouse name: Not on file   Number of children: Not on file   Years of education: Not on file   Highest education level: Not on file  Occupational History   Not on file  Tobacco Use   Smoking status: Never   Smokeless tobacco: Never  Substance and Sexual Activity   Alcohol use: Not Currently    Comment: rarely   Drug use: No   Sexual activity: Not on file  Other Topics Concern   Not on file  Social History Narrative  Not on file   Social Determinants of Health   Financial Resource Strain: Not on file  Food Insecurity: Not on file  Transportation Needs: Not on file  Physical Activity: Not on file  Stress: Not on file  Social Connections: Not on file  Intimate Partner Violence: Not on file      Review of Systems  Constitutional:  Negative for chills, fatigue and unexpected weight change.  HENT:  Positive for postnasal drip. Negative for congestion and rhinorrhea.   Eyes:  Negative for redness.  Respiratory:  Negative for cough, chest tightness and shortness of breath.   Cardiovascular:  Negative for chest pain and palpitations.  Gastrointestinal:  Negative for abdominal pain and constipation.  Genitourinary:  Negative for dysuria and frequency.  Musculoskeletal:  Negative for  arthralgias and back pain.  Skin:  Negative for rash.  Neurological: Negative.  Negative for tremors and numbness.  Hematological:  Negative for adenopathy. Does not bruise/bleed easily.  Psychiatric/Behavioral:  Negative for behavioral problems (Depression), sleep disturbance and suicidal ideas. The patient is not nervous/anxious.     Vital Signs: BP 118/85   Pulse 86   Temp 98.3 F (36.8 C)   Resp 16   Ht 5\' 2"  (1.575 m)   Wt 225 lb 6.4 oz (102.2 kg)   LMP  (LMP Unknown)   SpO2 93%   BMI 41.23 kg/m    Physical Exam Vitals and nursing note reviewed.  Constitutional:      General: She is not in acute distress.    Appearance: She is well-developed. She is obese. She is not diaphoretic.  HENT:     Head: Normocephalic and atraumatic.     Mouth/Throat:     Pharynx: No oropharyngeal exudate.  Eyes:     Pupils: Pupils are equal, round, and reactive to light.  Neck:     Thyroid: No thyromegaly.     Vascular: No JVD.     Trachea: No tracheal deviation.  Cardiovascular:     Rate and Rhythm: Normal rate and regular rhythm.     Heart sounds: Normal heart sounds. No murmur heard.    No friction rub. No gallop.  Pulmonary:     Effort: Pulmonary effort is normal. No respiratory distress.     Breath sounds: No wheezing or rales.  Chest:     Chest wall: No tenderness.  Breasts:    Right: Normal. No mass.     Left: Normal. No mass.  Abdominal:     General: Bowel sounds are normal.     Palpations: Abdomen is soft.  Musculoskeletal:        General: Normal range of motion.     Cervical back: Normal range of motion and neck supple.  Lymphadenopathy:     Cervical: No cervical adenopathy.  Skin:    General: Skin is warm and dry.  Neurological:     Mental Status: She is alert and oriented to person, place, and time.     Cranial Nerves: No cranial nerve deficit.  Psychiatric:        Behavior: Behavior normal.        Thought Content: Thought content normal.        Judgment:  Judgment normal.        Assessment/Plan: 1. Morbid obesity with BMI of 40.0-44.9, adult (HCC) Pt requests zepbound cashpay option with [employer], will send initial 2.5 mg weekly dose for 4 weeks, then may increase to 5mg  weekly if tolerating and wanting to continue. Pt will  call to schedule follow up once started on medication for a few weeks - tirzepatide (ZEPBOUND) 2.5 MG/0.5ML injection vial; Inject 2.5 mg into the skin once a week.  Dispense: 2 mL; Refill: 0 - tirzepatide 5 MG/0.5ML injection vial; Inject 5 mg into the skin once a week.  Dispense: 2 mL; Refill: 2   General Counseling: Benay verbalizes understanding of the findings of todays visit and agrees with plan of treatment. I have discussed any further diagnostic evaluation that may be needed or ordered today. We also reviewed her medications today. she has been encouraged to call the office with any questions or concerns that should arise related to todays visit.    No orders of the defined types were placed in this encounter.   Meds ordered this encounter  Medications   tirzepatide (ZEPBOUND) 2.5 MG/0.5ML injection vial    Sig: Inject 2.5 mg into the skin once a week.    Dispense:  2 mL    Refill:  0    May increase to 5mg  weekly injection after 4 weeks   tirzepatide 5 MG/0.5ML injection vial    Sig: Inject 5 mg into the skin once a week.    Dispense:  2 mL    Refill:  2    Start after 4 weeks of 2.5mg  dose    This patient was seen by Lynn Ito, PA-C in collaboration with Dr. Beverely Risen as a part of collaborative care agreement.   Total time spent:30 Minutes Time spent includes review of chart, medications, test results, and follow up plan with the patient.      Dr Lyndon Code Internal medicine

## 2023-07-25 MED ORDER — TIRZEPATIDE-WEIGHT MANAGEMENT 2.5 MG/0.5ML ~~LOC~~ SOLN
2.5000 mg | SUBCUTANEOUS | 0 refills | Status: DC
Start: 2023-07-25 — End: 2023-08-14

## 2023-07-25 MED ORDER — TIRZEPATIDE-WEIGHT MANAGEMENT 5 MG/0.5ML ~~LOC~~ SOLN
5.0000 mg | SUBCUTANEOUS | 2 refills | Status: DC
Start: 2023-07-25 — End: 2023-08-22

## 2023-08-04 ENCOUNTER — Ambulatory Visit: Payer: Medicare HMO | Admitting: Physician Assistant

## 2023-08-14 ENCOUNTER — Other Ambulatory Visit: Payer: Self-pay | Admitting: Physician Assistant

## 2023-08-22 ENCOUNTER — Ambulatory Visit (INDEPENDENT_AMBULATORY_CARE_PROVIDER_SITE_OTHER): Payer: Medicare HMO | Admitting: Physician Assistant

## 2023-08-22 ENCOUNTER — Ambulatory Visit: Payer: Medicare HMO

## 2023-08-22 ENCOUNTER — Encounter: Payer: Self-pay | Admitting: Physician Assistant

## 2023-08-22 VITALS — BP 109/70 | HR 87 | Temp 97.7°F | Resp 16 | Ht 62.0 in | Wt 220.2 lb

## 2023-08-22 DIAGNOSIS — I10 Essential (primary) hypertension: Secondary | ICD-10-CM

## 2023-08-22 DIAGNOSIS — Z6841 Body Mass Index (BMI) 40.0 and over, adult: Secondary | ICD-10-CM

## 2023-08-22 DIAGNOSIS — E538 Deficiency of other specified B group vitamins: Secondary | ICD-10-CM | POA: Diagnosis not present

## 2023-08-22 MED ORDER — CYANOCOBALAMIN 1000 MCG/ML IJ SOLN
1000.0000 ug | Freq: Once | INTRAMUSCULAR | Status: AC
Start: 2023-08-22 — End: 2023-08-22
  Administered 2023-08-22: 1000 ug via INTRAMUSCULAR

## 2023-08-22 MED ORDER — TIRZEPATIDE-WEIGHT MANAGEMENT 2.5 MG/0.5ML ~~LOC~~ SOLN
2.5000 mg | SUBCUTANEOUS | 2 refills | Status: DC
Start: 2023-08-22 — End: 2023-12-01

## 2023-08-22 NOTE — Progress Notes (Signed)
[facility] [address] [place], [place] [address]  Internal MEDICINE  Office Visit Note  Patient Name: Ashley Mckay  [id number]  [id number]  Date of Service: 08/22/2023  Chief Complaint  Patient presents with   Follow-up   Hypertension    HPI Pt is here for routine follow up -Doing well with zepbound, a little nausea but tolerable -has completed 4 weeks on the 2.5mg  dose and would like to continue on this, states she has the next round at home now -down 5 lbs since last visit -just started using invisalign  Current Medication: Outpatient Encounter Medications as of 08/22/2023  Medication Sig Note   aspirin EC 81 MG tablet Take 81 mg by mouth daily. Swallow whole.    Docusate Calcium (STOOL SOFTENER PO) Take by mouth.    ibuprofen (ADVIL,MOTRIN) 800 MG tablet 800 mg every 8 (eight) hours as needed.  06/21/2014: Received from: External Pharmacy   levocetirizine (XYZAL) 5 MG tablet Take 1 tablet (5 mg total) by mouth every evening.    Magnesium 250 MG TABS Take by mouth.    ondansetron (ZOFRAN) 4 MG tablet Take 1 tablet (4 mg total) by mouth every 8 (eight) hours as needed for nausea or vomiting.    oxybutynin (DITROPAN-XL) 10 MG 24 hr tablet Take 1 tablet (10 mg total) by mouth at bedtime.    propranolol ER (INDERAL LA) 80 MG 24 hr capsule Take 1 capsule (80 mg total) by mouth daily.    rosuvastatin (CRESTOR) 10 MG tablet Take 1 tablet (10 mg total) by mouth daily.    tirzepatide (ZEPBOUND) 2.5 MG/0.5ML injection vial Inject 2.5 mg into the skin once a week.    triamcinolone cream (KENALOG) 0.1 % Apply 1 application topically 2 (two) times daily.    VITAMIN D, CHOLECALCIFEROL, PO Take 1,000 Int'l Units by mouth daily.    [DISCONTINUED] tirzepatide 5 MG/0.5ML injection vial Inject 5 mg into the skin once a week.    [DISCONTINUED] ZEPBOUND 2.5 MG/0.5ML injection vial INJECT 0.5 ML (2.5 MG) UNDER THE SKIN ONCE WEEKLY    [EXPIRED] cyanocobalamin (VITAMIN B12)  injection 1,000 mcg     No facility-administered encounter medications on file as of 08/22/2023.    Surgical History: Past Surgical History:  Procedure Laterality Date   COLONOSCOPY     COLONOSCOPY WITH PROPOFOL N/A 01/31/2016   Procedure: COLONOSCOPY WITH PROPOFOL;  Surgeon: Scot Jun, MD;  Location: [facility] ENDOSCOPY;  Service: Endoscopy;  Laterality: N/A;   ESOPHAGOGASTRODUODENOSCOPY     tooth implant     started in 2020 but finished dec 2021    Medical History: Past Medical History:  Diagnosis Date   Arthritis    Headache    History of chickenpox    Hypertension    Sleep apnea     Family History: Family History  Problem Relation Age of Onset   Breast cancer Mother 26    Social History   Socioeconomic History   Marital status: Married    Spouse name: Not on file   Number of children: Not on file   Years of education: Not on file   Highest education level: Not on file  Occupational History   Not on file  Tobacco Use   Smoking status: Never   Smokeless tobacco: Never  Substance and Sexual Activity   Alcohol use: Not Currently    Comment: rarely   Drug use: No   Sexual activity: Not on file  Other Topics Concern  Not on file  Social History Narrative   Not on file   Social Drivers of Health   Financial Resource Strain: Not on file  Food Insecurity: Not on file  Transportation Needs: Not on file  Physical Activity: Not on file  Stress: Not on file  Social Connections: Not on file  Intimate Partner Violence: Not on file      Review of Systems  Constitutional:  Negative for chills, fatigue and unexpected weight change.  HENT:  Positive for postnasal drip. Negative for congestion and rhinorrhea.   Eyes:  Negative for redness.  Respiratory:  Negative for cough, chest tightness and shortness of breath.   Cardiovascular:  Negative for chest pain and palpitations.  Gastrointestinal:  Negative for abdominal pain and constipation.  Genitourinary:   Negative for dysuria and frequency.  Musculoskeletal:  Negative for arthralgias and back pain.  Skin:  Negative for rash.  Neurological: Negative.  Negative for tremors and numbness.  Hematological:  Negative for adenopathy. Does not bruise/bleed easily.  Psychiatric/Behavioral:  Negative for behavioral problems (Depression), sleep disturbance and suicidal ideas. The patient is not nervous/anxious.     Vital Signs: BP 109/70   Pulse 87   Temp 97.7 F (36.5 C)   Resp 16   Ht 5\' 2"  (1.575 m)   Wt 220 lb 3.2 oz (99.9 kg)   LMP  (LMP Unknown)   SpO2 95%   BMI 40.28 kg/m    Physical Exam Vitals and nursing note reviewed.  Constitutional:      General: She is not in acute distress.    Appearance: She is well-developed. She is obese. She is not diaphoretic.  HENT:     Head: Normocephalic and atraumatic.     Mouth/Throat:     Pharynx: No oropharyngeal exudate.  Eyes:     Pupils: Pupils are equal, round, and reactive to light.  Neck:     Thyroid: No thyromegaly.     Vascular: No JVD.     Trachea: No tracheal deviation.  Cardiovascular:     Rate and Rhythm: Normal rate and regular rhythm.     Heart sounds: Normal heart sounds. No murmur heard.    No friction rub. No gallop.  Pulmonary:     Effort: Pulmonary effort is normal. No respiratory distress.     Breath sounds: No wheezing or rales.  Chest:     Chest wall: No tenderness.  Breasts:    Right: Normal. No mass.     Left: Normal. No mass.  Abdominal:     General: Bowel sounds are normal.     Palpations: Abdomen is soft.  Musculoskeletal:        General: Normal range of motion.     Cervical back: Normal range of motion and neck supple.  Lymphadenopathy:     Cervical: No cervical adenopathy.  Skin:    General: Skin is warm and dry.  Neurological:     Mental Status: She is alert and oriented to person, place, and time.     Cranial Nerves: No cranial nerve deficit.  Psychiatric:        Behavior: Behavior normal.         Thought Content: Thought content normal.        Judgment: Judgment normal.        Assessment/Plan: 1. Essential hypertension Well controlled, continue current medication  2. Morbid obesity with BMI of 40.0-44.9, adult (HCC) Down 5lbs, may continue zepbound while working on diet and exercise - tirzepatide (  ZEPBOUND) 2.5 MG/0.5ML injection vial; Inject 2.5 mg into the skin once a week.  Dispense: 2 mL; Refill: 2  3. B12 deficiency (Primary) - cyanocobalamin (VITAMIN B12) injection 1,000 mcg   General Counseling: Hillari verbalizes understanding of the findings of todays visit and agrees with plan of treatment. I have discussed any further diagnostic evaluation that may be needed or ordered today. We also reviewed her medications today. she has been encouraged to call the office with any questions or concerns that should arise related to todays visit.    No orders of the defined types were placed in this encounter.   Meds ordered this encounter  Medications   cyanocobalamin (VITAMIN B12) injection 1,000 mcg   tirzepatide (ZEPBOUND) 2.5 MG/0.5ML injection vial    Sig: Inject 2.5 mg into the skin once a week.    Dispense:  2 mL    Refill:  2    This patient was seen by Lynn Ito, PA-C in collaboration with Dr. Beverely Risen as a part of collaborative care agreement.   Total time spent:30 Minutes Time spent includes review of chart, medications, test results, and follow up plan with the patient.      Dr Lyndon Code Internal medicine

## 2023-09-04 ENCOUNTER — Other Ambulatory Visit: Payer: Self-pay | Admitting: Physician Assistant

## 2023-09-12 ENCOUNTER — Ambulatory Visit: Payer: Self-pay | Admitting: Physician Assistant

## 2023-09-19 ENCOUNTER — Encounter: Payer: Self-pay | Admitting: Physician Assistant

## 2023-09-19 ENCOUNTER — Ambulatory Visit: Payer: Self-pay

## 2023-09-19 ENCOUNTER — Ambulatory Visit (INDEPENDENT_AMBULATORY_CARE_PROVIDER_SITE_OTHER): Payer: Self-pay | Admitting: Physician Assistant

## 2023-09-19 VITALS — BP 130/80 | HR 86 | Temp 98.7°F | Resp 16 | Ht 62.0 in | Wt 212.8 lb

## 2023-09-19 DIAGNOSIS — I10 Essential (primary) hypertension: Secondary | ICD-10-CM

## 2023-09-19 DIAGNOSIS — E669 Obesity, unspecified: Secondary | ICD-10-CM | POA: Diagnosis not present

## 2023-09-19 DIAGNOSIS — G4733 Obstructive sleep apnea (adult) (pediatric): Secondary | ICD-10-CM

## 2023-09-19 DIAGNOSIS — E538 Deficiency of other specified B group vitamins: Secondary | ICD-10-CM | POA: Diagnosis not present

## 2023-09-19 MED ORDER — CYANOCOBALAMIN 1000 MCG/ML IJ SOLN
1000.0000 ug | Freq: Once | INTRAMUSCULAR | Status: AC
Start: 2023-09-19 — End: 2023-09-19
  Administered 2023-09-19: 1000 ug via INTRAMUSCULAR

## 2023-09-19 NOTE — Progress Notes (Signed)
 [facility] [address] [place], [place] [address]  Internal MEDICINE  Office Visit Note  Patient Name: Ashley Mckay  [id number]  [id number]  Date of Service: 09/19/2023  Chief Complaint  Patient presents with   Follow-up   Hypertension    HPI Pt is here for routine follow up for wt management -Down 8lbs since last visit, taking 2.5mg  zepbound  and doing well with this. Wants to continue on this dose. -a little heart burn occasionally, but tolerating ok -Was sick around Christmas, another family member had flu so may have had this. Feeling better now -using invisalign now -BP stable  Current Medication: Outpatient Encounter Medications as of 09/19/2023  Medication Sig Note   aspirin EC 81 MG tablet Take 81 mg by mouth daily. Swallow whole.    Docusate Calcium  (STOOL SOFTENER PO) Take by mouth.    ibuprofen (ADVIL,MOTRIN) 800 MG tablet 800 mg every 8 (eight) hours as needed.  06/21/2014: Received from: External Pharmacy   levocetirizine (XYZAL ) 5 MG tablet Take 1 tablet (5 mg total) by mouth every evening.    Magnesium 250 MG TABS Take by mouth.    ondansetron  (ZOFRAN ) 4 MG tablet Take 1 tablet (4 mg total) by mouth every 8 (eight) hours as needed for nausea or vomiting.    oxybutynin  (DITROPAN -XL) 10 MG 24 hr tablet Take 1 tablet (10 mg total) by mouth at bedtime.    propranolol  ER (INDERAL  LA) 80 MG 24 hr capsule Take 1 capsule (80 mg total) by mouth daily.    rosuvastatin  (CRESTOR ) 10 MG tablet Take 1 tablet (10 mg total) by mouth daily.    tirzepatide  (ZEPBOUND ) 2.5 MG/0.5ML injection vial Inject 2.5 mg into the skin once a week.    triamcinolone  cream (KENALOG ) 0.1 % Apply 1 application topically 2 (two) times daily.    VITAMIN D , CHOLECALCIFEROL, PO Take 1,000 Int'l Units by mouth daily.    [EXPIRED] cyanocobalamin  (VITAMIN B12) injection 1,000 mcg     No facility-administered encounter medications on file as of 09/19/2023.    Surgical History: Past  Surgical History:  Procedure Laterality Date   COLONOSCOPY     COLONOSCOPY WITH PROPOFOL  N/A 01/31/2016   Procedure: COLONOSCOPY WITH PROPOFOL ;  Surgeon: Lamar ONEIDA Holmes, MD;  Location: [facility] ENDOSCOPY;  Service: Endoscopy;  Laterality: N/A;   ESOPHAGOGASTRODUODENOSCOPY     tooth implant     started in 2020 but finished dec 2021    Medical History: Past Medical History:  Diagnosis Date   Arthritis    Headache    History of chickenpox    Hypertension    Sleep apnea     Family History: Family History  Problem Relation Age of Onset   Breast cancer Mother 42    Social History   Socioeconomic History   Marital status: Married    Spouse name: Not on file   Number of children: Not on file   Years of education: Not on file   Highest education level: Not on file  Occupational History   Not on file  Tobacco Use   Smoking status: Never   Smokeless tobacco: Never  Substance and Sexual Activity   Alcohol use: Not Currently    Comment: rarely   Drug use: No   Sexual activity: Not on file  Other Topics Concern   Not on file  Social History Narrative   Not on file   Social Drivers of Health   Financial Resource Strain: Not on file  Food  Insecurity: Not on file  Transportation Needs: Not on file  Physical Activity: Not on file  Stress: Not on file  Social Connections: Not on file  Intimate Partner Violence: Not on file      Review of Systems  Constitutional:  Negative for chills, fatigue and unexpected weight change.  HENT:  Positive for postnasal drip. Negative for congestion and rhinorrhea.   Eyes:  Negative for redness.  Respiratory:  Negative for cough, chest tightness and shortness of breath.   Cardiovascular:  Negative for chest pain and palpitations.  Gastrointestinal:  Negative for abdominal pain and constipation.  Genitourinary:  Negative for dysuria and frequency.  Musculoskeletal:  Negative for arthralgias and back pain.  Skin:  Negative for rash.   Neurological: Negative.  Negative for tremors and numbness.  Hematological:  Negative for adenopathy. Does not bruise/bleed easily.  Psychiatric/Behavioral:  Negative for behavioral problems (Depression), sleep disturbance and suicidal ideas. The patient is not nervous/anxious.     Vital Signs: BP 130/80 Comment: 140/80  Pulse 86   Temp 98.7 F (37.1 C)   Resp 16   Ht 5' 2 (1.575 m)   Wt 212 lb 12.8 oz (96.5 kg)   LMP  (LMP Unknown)   SpO2 96%   BMI 38.92 kg/m    Physical Exam Vitals and nursing note reviewed.  Constitutional:      General: She is not in acute distress.    Appearance: She is well-developed. She is obese. She is not diaphoretic.  HENT:     Head: Normocephalic and atraumatic.     Mouth/Throat:     Pharynx: No oropharyngeal exudate.  Eyes:     Pupils: Pupils are equal, round, and reactive to light.  Neck:     Thyroid : No thyromegaly.     Vascular: No JVD.     Trachea: No tracheal deviation.  Cardiovascular:     Rate and Rhythm: Normal rate and regular rhythm.     Heart sounds: Normal heart sounds. No murmur heard.    No friction rub. No gallop.  Pulmonary:     Effort: Pulmonary effort is normal. No respiratory distress.     Breath sounds: No wheezing or rales.  Chest:     Chest wall: No tenderness.  Breasts:    Right: Normal. No mass.     Left: Normal. No mass.  Abdominal:     General: Bowel sounds are normal.     Palpations: Abdomen is soft.  Musculoskeletal:        General: Normal range of motion.     Cervical back: Normal range of motion and neck supple.  Lymphadenopathy:     Cervical: No cervical adenopathy.  Skin:    General: Skin is warm and dry.  Neurological:     Mental Status: She is alert and oriented to person, place, and time.     Cranial Nerves: No cranial nerve deficit.  Psychiatric:        Behavior: Behavior normal.        Thought Content: Thought content normal.        Judgment: Judgment normal.         Assessment/Plan: 1. Essential hypertension (Primary) Stable, continue current medication  2. OSA (obstructive sleep apnea) Continue cpap and working on wt loss  3. Obesity (BMI 30-39.9) Down 8lbs since last visit, continue to work on diet and exercise and may continue on zepbound  Obesity Counseling: Had a lengthy discussion regarding patients BMI and weight issues. Patient was instructed  on portion control as well as increased activity. Also discussed caloric restrictions with trying to maintain intake less than 2000 Kcal. Discussions were made in accordance with the 5As of weight management. Simple actions such as not eating late and if able to, taking a walk is suggested.  4. B12 deficiency - cyanocobalamin  (VITAMIN B12) injection 1,000 mcg   General Counseling: Domitila verbalizes understanding of the findings of todays visit and agrees with plan of treatment. I have discussed any further diagnostic evaluation that may be needed or ordered today. We also reviewed her medications today. she has been encouraged to call the office with any questions or concerns that should arise related to todays visit.    No orders of the defined types were placed in this encounter.   Meds ordered this encounter  Medications   cyanocobalamin  (VITAMIN B12) injection 1,000 mcg    This patient was seen by Tinnie Pro, PA-C in collaboration with Dr. Sigrid Bathe as a part of collaborative care agreement.   Total time spent:30 Minutes Time spent includes review of chart, medications, test results, and follow up plan with the patient.      Dr Fozia M Khan Internal medicine

## 2023-10-13 ENCOUNTER — Ambulatory Visit (INDEPENDENT_AMBULATORY_CARE_PROVIDER_SITE_OTHER): Payer: PPO | Admitting: Internal Medicine

## 2023-10-13 ENCOUNTER — Encounter: Payer: Self-pay | Admitting: Internal Medicine

## 2023-10-13 VITALS — BP 119/70 | HR 90 | Temp 98.4°F | Resp 16 | Ht 62.0 in | Wt 211.0 lb

## 2023-10-13 DIAGNOSIS — G4733 Obstructive sleep apnea (adult) (pediatric): Secondary | ICD-10-CM

## 2023-10-13 NOTE — Progress Notes (Signed)
[facility] [address] [place], [place] [address]  Pulmonary Sleep Medicine   Office Visit Note  Patient Name: Ashley Mckay DOB: 1951-01-15 MRN [id number]  Date of Service: 10/13/2023  Complaints/HPI: follow up for OSA/ Patient is doing well with the CPAP nightly. She states she has good response to the treatments. She has no cough no ccongestion. She states she does feel better with the machine. When she does not use it she notices a big diference  Office Spirometry Results:     ROS  General: (-) fever, (-) chills, (-) night sweats, (-) weakness Skin: (-) rashes, (-) itching,. Eyes: (-) visual changes, (-) redness, (-) itching. Nose and Sinuses: (-) nasal stuffiness or itchiness, (-) postnasal drip, (-) nosebleeds, (-) sinus trouble. Mouth and Throat: (-) sore throat, (-) hoarseness. Neck: (-) swollen glands, (-) enlarged thyroid, (-) neck pain. Respiratory: - cough, (-) bloody sputum, - shortness of breath, - wheezing. Cardiovascular: - ankle swelling, (-) chest pain. Lymphatic: (-) lymph node enlargement. Neurologic: (-) numbness, (-) tingling. Psychiatric: (-) anxiety, (-) depression   Current Medication: Outpatient Encounter Medications as of 10/13/2023  Medication Sig Note   aspirin EC 81 MG tablet Take 81 mg by mouth daily. Swallow whole.    Docusate Calcium (STOOL SOFTENER PO) Take by mouth.    ibuprofen (ADVIL,MOTRIN) 800 MG tablet 800 mg every 8 (eight) hours as needed.  06/21/2014: Received from: External Pharmacy   levocetirizine (XYZAL) 5 MG tablet Take 1 tablet (5 mg total) by mouth every evening.    Magnesium 250 MG TABS Take by mouth.    ondansetron (ZOFRAN) 4 MG tablet Take 1 tablet (4 mg total) by mouth every 8 (eight) hours as needed for nausea or vomiting.    oxybutynin (DITROPAN-XL) 10 MG 24 hr tablet Take 1 tablet (10 mg total) by mouth at bedtime.    propranolol ER (INDERAL LA) 80 MG 24 hr capsule Take 1 capsule (80 mg total) by mouth  daily.    rosuvastatin (CRESTOR) 10 MG tablet Take 1 tablet (10 mg total) by mouth daily.    tirzepatide (ZEPBOUND) 2.5 MG/0.5ML injection vial Inject 2.5 mg into the skin once a week.    triamcinolone cream (KENALOG) 0.1 % Apply 1 application topically 2 (two) times daily.    VITAMIN D, CHOLECALCIFEROL, PO Take 1,000 Int'l Units by mouth daily.    No facility-administered encounter medications on file as of 10/13/2023.    Surgical History: Past Surgical History:  Procedure Laterality Date   COLONOSCOPY     COLONOSCOPY WITH PROPOFOL N/A 01/31/2016   Procedure: COLONOSCOPY WITH PROPOFOL;  Surgeon: Scot Jun, MD;  Location: [facility] ENDOSCOPY;  Service: Endoscopy;  Laterality: N/A;   ESOPHAGOGASTRODUODENOSCOPY     tooth implant     started in 2020 but finished dec 2021    Medical History: Past Medical History:  Diagnosis Date   Arthritis    Headache    History of chickenpox    Hypertension    Sleep apnea     Family History: Family History  Problem Relation Age of Onset   Breast cancer Mother 46    Social History: Social History   Socioeconomic History   Marital status: Married    Spouse name: Not on file   Number of children: Not on file   Years of education: Not on file   Highest education level: Not on file  Occupational History   Not on file  Tobacco Use   Smoking status:  Never   Smokeless tobacco: Never  Substance and Sexual Activity   Alcohol use: Not Currently    Comment: rarely   Drug use: No   Sexual activity: Not on file  Other Topics Concern   Not on file  Social History Narrative   Not on file   Social Drivers of Health   Financial Resource Strain: Not on file  Food Insecurity: Not on file  Transportation Needs: Not on file  Physical Activity: Not on file  Stress: Not on file  Social Connections: Not on file  Intimate Partner Violence: Not on file    Vital Signs: Blood pressure 119/70, pulse 90, temperature 98.4 F (36.9 C), resp. rate  16, height 5\' 2"  (1.575 m), weight 211 lb (95.7 kg), SpO2 95%.  Examination: General Appearance: The patient is well-developed, well-nourished, and in no distress. Skin: Gross inspection of skin unremarkable. Head: normocephalic, no gross deformities. Eyes: no gross deformities noted. ENT: ears appear grossly normal no exudates. Neck: Supple. No thyromegaly. No LAD. Respiratory: no rhonchi noted. Cardiovascular: Normal S1 and S2 without murmur or rub. Extremities: No cyanosis. pulses are equal. Neurologic: Alert and oriented. No involuntary movements.  LABS: No results found for this or any previous visit (from the past 2160 hours).  Radiology: MM 3D SCREENING MAMMOGRAM BILATERAL BREAST Result Date: 06/05/2023 CLINICAL DATA:  Screening. EXAM: DIGITAL SCREENING BILATERAL MAMMOGRAM WITH TOMOSYNTHESIS AND CAD TECHNIQUE: Bilateral screening digital craniocaudal and mediolateral oblique mammograms were obtained. Bilateral screening digital breast tomosynthesis was performed. The images were evaluated with computer-aided detection. COMPARISON:  Previous exam(s). ACR Breast Density Category c: The breasts are heterogeneously dense, which may obscure small masses. FINDINGS: There are no findings suspicious for malignancy. IMPRESSION: No mammographic evidence of malignancy. A result letter of this screening mammogram will be mailed directly to the patient. RECOMMENDATION: Screening mammogram in one year. (Code:SM-B-01Y) BI-RADS CATEGORY  1: Negative. Electronically Signed   By: Frederico Hamman M.D.   On: 06/05/2023 16:24    No results found.  No results found.  Assessment and Plan: Patient Active Problem List   Diagnosis Date Noted   Encounter for screening mammogram for malignant neoplasm of breast 04/24/2020   OSA on CPAP 01/07/2020   Urinary incontinence 04/25/2019   Need for vaccination against Streptococcus pneumoniae using pneumococcal conjugate vaccine 13 06/30/2018   Rash 04/14/2018    Vaginal candidiasis 04/14/2018   Stomatitis and mucositis 04/14/2018   Other atopic dermatitis 04/14/2018   Nausea 04/14/2018   Encounter for general adult medical examination with abnormal findings 12/29/2017   Essential hypertension 12/29/2017   Mixed hyperlipidemia 12/29/2017   Dysuria 12/29/2017   Non-seasonal allergic rhinitis due to pollen 12/29/2017   Unspecified menopausal and perimenopausal disorder 12/29/2017   Urinary tract infection with hematuria 12/29/2017   Neoplasm of uncertain behavior of skin of lower extremity 12/29/2017   Plantar fasciitis 09/11/2015   Paronychia 08/25/2014    1. OSA (obstructive sleep apnea) (Primary) She is on CPAP and will continue with current pressure levels. She states she is noting a significant ongoing inprovement  2. Obesity, morbid (HCC) Obesity Counseling: Had a lengthy discussion regarding patients BMI and weight issues. Patient was instructed on portion control as well as increased activity. Also discussed caloric restrictions with trying to maintain intake less than 2000 Kcal.    General Counseling: I have discussed the findings of the evaluation and examination with Birda.  I have also discussed any further diagnostic evaluation thatmay be needed or ordered today. Freyja  verbalizes understanding of the findings of todays visit. We also reviewed her medications today and discussed drug interactions and side effects including but not limited excessive drowsiness and altered mental states. We also discussed that there is always a risk not just to her but also people around her. she has been encouraged to call the office with any questions or concerns that should arise related to todays visit.  No orders of the defined types were placed in this encounter.    Time spent: 42  I have personally obtained a history, examined the patient, evaluated laboratory and imaging results, formulated the assessment and plan and placed orders.     Yevonne Pax, MD [facility] Pulmonary and Critical Care Sleep medicine

## 2023-10-13 NOTE — Patient Instructions (Signed)

## 2023-10-17 ENCOUNTER — Ambulatory Visit (INDEPENDENT_AMBULATORY_CARE_PROVIDER_SITE_OTHER): Payer: PPO | Admitting: Physician Assistant

## 2023-10-17 ENCOUNTER — Encounter: Payer: Self-pay | Admitting: Physician Assistant

## 2023-10-17 VITALS — BP 135/85 | HR 81 | Temp 98.1°F | Resp 16 | Ht 62.0 in | Wt 211.0 lb

## 2023-10-17 DIAGNOSIS — G4733 Obstructive sleep apnea (adult) (pediatric): Secondary | ICD-10-CM | POA: Diagnosis not present

## 2023-10-17 DIAGNOSIS — E669 Obesity, unspecified: Secondary | ICD-10-CM | POA: Diagnosis not present

## 2023-10-17 DIAGNOSIS — E538 Deficiency of other specified B group vitamins: Secondary | ICD-10-CM

## 2023-10-17 DIAGNOSIS — I10 Essential (primary) hypertension: Secondary | ICD-10-CM

## 2023-10-17 MED ORDER — CYANOCOBALAMIN 1000 MCG/ML IJ SOLN
1000.0000 ug | Freq: Once | INTRAMUSCULAR | Status: AC
Start: 2023-10-17 — End: 2023-10-17
  Administered 2023-10-17: 1000 ug via INTRAMUSCULAR

## 2023-10-17 NOTE — Progress Notes (Signed)
 [facility] [address] [place], [place] [address]  Internal MEDICINE  Office Visit Note  Patient Name: Ashley Mckay  [id number]  [id number]  Date of Service: 10/17/2023  Chief Complaint  Patient presents with   Follow-up    HPI Pt is here for routine follow up -Down 1 lb since last visit -Still doing the 2.5mg  zepbound  injections, would like to continue at this dose -is going to contact new insurance to see about coverage for this since she has OSA as well. She will let us  know. Currently paying OOP with [facility] pharmacy -left ear a little sore and pressure, a little tender, thinks related to weather. Starting some mucinex and nasal spray due to a little drainage. Advised to call if not improving  Current Medication: Outpatient Encounter Medications as of 10/17/2023  Medication Sig Note   aspirin EC 81 MG tablet Take 81 mg by mouth daily. Swallow whole.    Docusate Calcium  (STOOL SOFTENER PO) Take by mouth.    ibuprofen (ADVIL,MOTRIN) 800 MG tablet 800 mg every 8 (eight) hours as needed.  06/21/2014: Received from: External Pharmacy   levocetirizine (XYZAL ) 5 MG tablet Take 1 tablet (5 mg total) by mouth every evening.    Magnesium 250 MG TABS Take by mouth.    ondansetron  (ZOFRAN ) 4 MG tablet Take 1 tablet (4 mg total) by mouth every 8 (eight) hours as needed for nausea or vomiting.    oxybutynin  (DITROPAN -XL) 10 MG 24 hr tablet Take 1 tablet (10 mg total) by mouth at bedtime.    propranolol  ER (INDERAL  LA) 80 MG 24 hr capsule Take 1 capsule (80 mg total) by mouth daily.    rosuvastatin  (CRESTOR ) 10 MG tablet Take 1 tablet (10 mg total) by mouth daily.    tirzepatide  (ZEPBOUND ) 2.5 MG/0.5ML injection vial Inject 2.5 mg into the skin once a week.    triamcinolone  cream (KENALOG ) 0.1 % Apply 1 application topically 2 (two) times daily.    VITAMIN D , CHOLECALCIFEROL, PO Take 1,000 Int'l Units by mouth daily.    Facility-Administered Encounter Medications as of 10/17/2023   Medication   cyanocobalamin  (VITAMIN B12) injection 1,000 mcg    Surgical History: Past Surgical History:  Procedure Laterality Date   COLONOSCOPY     COLONOSCOPY WITH PROPOFOL  N/A 01/31/2016   Procedure: COLONOSCOPY WITH PROPOFOL ;  Surgeon: Lamar ONEIDA Holmes, MD;  Location: [facility] ENDOSCOPY;  Service: Endoscopy;  Laterality: N/A;   ESOPHAGOGASTRODUODENOSCOPY     tooth implant     started in 2020 but finished dec 2021    Medical History: Past Medical History:  Diagnosis Date   Arthritis    Headache    History of chickenpox    Hypertension    Sleep apnea     Family History: Family History  Problem Relation Age of Onset   Breast cancer Mother 19    Social History   Socioeconomic History   Marital status: Married    Spouse name: Not on file   Number of children: Not on file   Years of education: Not on file   Highest education level: Not on file  Occupational History   Not on file  Tobacco Use   Smoking status: Never   Smokeless tobacco: Never  Substance and Sexual Activity   Alcohol use: Not Currently    Comment: rarely   Drug use: No   Sexual activity: Not on file  Other Topics Concern   Not on file  Social History Narrative  Not on file   Social Drivers of Health   Financial Resource Strain: Not on file  Food Insecurity: Not on file  Transportation Needs: Not on file  Physical Activity: Not on file  Stress: Not on file  Social Connections: Not on file  Intimate Partner Violence: Not on file      Review of Systems  Constitutional:  Negative for chills, fatigue and unexpected weight change.  HENT:  Positive for ear pain and postnasal drip. Negative for congestion and rhinorrhea.   Eyes:  Negative for redness.  Respiratory:  Negative for cough, chest tightness and shortness of breath.   Cardiovascular:  Negative for chest pain and palpitations.  Gastrointestinal:  Negative for abdominal pain and constipation.  Genitourinary:  Negative for dysuria  and frequency.  Musculoskeletal:  Negative for arthralgias and back pain.  Skin:  Negative for rash.  Neurological: Negative.  Negative for tremors and numbness.  Hematological:  Negative for adenopathy. Does not bruise/bleed easily.  Psychiatric/Behavioral:  Negative for behavioral problems (Depression), sleep disturbance and suicidal ideas. The patient is not nervous/anxious.     Vital Signs: BP 135/85   Pulse 81   Temp 98.1 F (36.7 C)   Resp 16   Ht 5' 2 (1.575 m)   Wt 211 lb (95.7 kg)   LMP  (LMP Unknown)   SpO2 93%   BMI 38.59 kg/m    Physical Exam Vitals and nursing note reviewed.  Constitutional:      General: She is not in acute distress.    Appearance: She is well-developed. She is obese. She is not diaphoretic.  HENT:     Head: Normocephalic and atraumatic.     Right Ear: Tympanic membrane normal.     Left Ear: Tympanic membrane normal.     Mouth/Throat:     Pharynx: No oropharyngeal exudate.  Eyes:     Pupils: Pupils are equal, round, and reactive to light.  Neck:     Thyroid : No thyromegaly.     Vascular: No JVD.     Trachea: No tracheal deviation.  Cardiovascular:     Rate and Rhythm: Normal rate and regular rhythm.     Heart sounds: Normal heart sounds. No murmur heard.    No friction rub. No gallop.  Pulmonary:     Effort: Pulmonary effort is normal. No respiratory distress.     Breath sounds: No wheezing or rales.  Chest:     Chest wall: No tenderness.  Breasts:    Right: Normal. No mass.     Left: Normal. No mass.  Abdominal:     General: Bowel sounds are normal.     Palpations: Abdomen is soft.  Musculoskeletal:        General: Normal range of motion.     Cervical back: Normal range of motion and neck supple.  Lymphadenopathy:     Cervical: No cervical adenopathy.  Skin:    General: Skin is warm and dry.  Neurological:     Mental Status: She is alert and oriented to person, place, and time.     Cranial Nerves: No cranial nerve  deficit.  Psychiatric:        Behavior: Behavior normal.        Thought Content: Thought content normal.        Judgment: Judgment normal.        Assessment/Plan: 1. Essential hypertension (Primary) Continue current medication  2. OSA (obstructive sleep apnea) Continue cpap as well as zepbound  for wt  loss that can help apnea  3. Obesity (BMI 30-39.9) Down 1 lb since last visit, will continue to work on diet and exercise and may continue zepbound   4. B12 deficiency - cyanocobalamin  (VITAMIN B12) injection 1,000 mcg   General Counseling: Alieyah verbalizes understanding of the findings of todays visit and agrees with plan of treatment. I have discussed any further diagnostic evaluation that may be needed or ordered today. We also reviewed her medications today. she has been encouraged to call the office with any questions or concerns that should arise related to todays visit.    No orders of the defined types were placed in this encounter.   Meds ordered this encounter  Medications   cyanocobalamin  (VITAMIN B12) injection 1,000 mcg    This patient was seen by Tinnie Pro, PA-C in collaboration with Dr. Sigrid Bathe as a part of collaborative care agreement.   Total time spent:30 Minutes Time spent includes review of chart, medications, test results, and follow up plan with the patient.      Dr Fozia M Khan Internal medicine

## 2023-10-24 ENCOUNTER — Ambulatory Visit: Payer: Self-pay

## 2023-11-04 ENCOUNTER — Other Ambulatory Visit: Payer: Self-pay | Admitting: Physician Assistant

## 2023-11-04 DIAGNOSIS — R32 Unspecified urinary incontinence: Secondary | ICD-10-CM

## 2023-11-12 DIAGNOSIS — G4733 Obstructive sleep apnea (adult) (pediatric): Secondary | ICD-10-CM | POA: Diagnosis not present

## 2023-11-14 ENCOUNTER — Encounter: Payer: Self-pay | Admitting: Physician Assistant

## 2023-11-14 ENCOUNTER — Ambulatory Visit: Payer: PPO | Admitting: Physician Assistant

## 2023-11-14 VITALS — BP 120/80 | HR 84 | Temp 97.8°F | Resp 16 | Ht 62.0 in | Wt 211.8 lb

## 2023-11-14 DIAGNOSIS — J0110 Acute frontal sinusitis, unspecified: Secondary | ICD-10-CM

## 2023-11-14 DIAGNOSIS — E538 Deficiency of other specified B group vitamins: Secondary | ICD-10-CM | POA: Diagnosis not present

## 2023-11-14 DIAGNOSIS — G4733 Obstructive sleep apnea (adult) (pediatric): Secondary | ICD-10-CM

## 2023-11-14 DIAGNOSIS — E669 Obesity, unspecified: Secondary | ICD-10-CM

## 2023-11-14 MED ORDER — CYANOCOBALAMIN 1000 MCG/ML IJ SOLN
1000.0000 ug | Freq: Once | INTRAMUSCULAR | Status: AC
Start: 2023-11-14 — End: 2023-11-14
  Administered 2023-11-14: 1000 ug via INTRAMUSCULAR

## 2023-11-14 MED ORDER — AZITHROMYCIN 250 MG PO TABS: ORAL_TABLET | ORAL | 0 refills | Status: AC

## 2023-11-14 NOTE — Progress Notes (Signed)
 [facility] [address] [place], [place] [address]  Internal MEDICINE  Office Visit Note  Patient Name: Ashley Mckay  [id number]  [id number]  Date of Service: 11/28/2023  Chief Complaint  Patient presents with   Follow-up   Medical Management of Chronic Issues    Weight management    Hypertension    HPI Pt is here for routine follow up -2weeks of sinus congestion and cough. Has been using mucinex and nasal spray. Did use some left over tussionex -Wt stable on 2.5mg  dose now, may want to increase dose in future, but has ordered 1 more round of the 2.5 for now. -using cpap nightly. Was hoping insurance would cover zepbound due to OSA dx but she is still paying OOP. Received letter for appeal, however unclear why it was denied as it says failed to try preferred on formulary, but no alternative options. May need to call to find out why it was denied  Current Medication: Outpatient Encounter Medications as of 11/14/2023  Medication Sig Note   aspirin EC 81 MG tablet Take 81 mg by mouth daily. Swallow whole.    [EXPIRED] azithromycin (ZITHROMAX) 250 MG tablet Take 2 tablets on day 1, then 1 tablet daily on days 2 through 5    Docusate Calcium (STOOL SOFTENER PO) Take by mouth.    ibuprofen (ADVIL,MOTRIN) 800 MG tablet 800 mg every 8 (eight) hours as needed.  06/21/2014: Received from: External Pharmacy   levocetirizine (XYZAL) 5 MG tablet Take 1 tablet (5 mg total) by mouth every evening.    Magnesium 250 MG TABS Take by mouth.    ondansetron (ZOFRAN) 4 MG tablet Take 1 tablet (4 mg total) by mouth every 8 (eight) hours as needed for nausea or vomiting.    oxybutynin (DITROPAN-XL) 10 MG 24 hr tablet TAKE ONE TABLET BY MOUTH DAILY AT BEDTIME    propranolol ER (INDERAL LA) 80 MG 24 hr capsule Take 1 capsule (80 mg total) by mouth daily.    rosuvastatin (CRESTOR) 10 MG tablet Take 1 tablet (10 mg total) by mouth daily.    tirzepatide (ZEPBOUND) 2.5 MG/0.5ML injection vial  Inject 2.5 mg into the skin once a week.    triamcinolone cream (KENALOG) 0.1 % Apply 1 application topically 2 (two) times daily.    VITAMIN D, CHOLECALCIFEROL, PO Take 1,000 Int'l Units by mouth daily.    [EXPIRED] cyanocobalamin (VITAMIN B12) injection 1,000 mcg     [EXPIRED] cyanocobalamin (VITAMIN B12) injection 1,000 mcg     No facility-administered encounter medications on file as of 11/14/2023.    Surgical History: Past Surgical History:  Procedure Laterality Date   COLONOSCOPY     COLONOSCOPY WITH PROPOFOL N/A 01/31/2016   Procedure: COLONOSCOPY WITH PROPOFOL;  Surgeon: Scot Jun, MD;  Location: [facility] ENDOSCOPY;  Service: Endoscopy;  Laterality: N/A;   ESOPHAGOGASTRODUODENOSCOPY     tooth implant     started in 2020 but finished dec 2021    Medical History: Past Medical History:  Diagnosis Date   Arthritis    Headache    History of chickenpox    Hypertension    Sleep apnea     Family History: Family History  Problem Relation Age of Onset   Breast cancer Mother 78    Social History   Socioeconomic History   Marital status: Married    Spouse name: Not on file   Number of children: Not on file   Years of education: Not on file  Highest education level: Not on file  Occupational History   Not on file  Tobacco Use   Smoking status: Never   Smokeless tobacco: Never  Substance and Sexual Activity   Alcohol use: Not Currently    Comment: rarely   Drug use: No   Sexual activity: Not on file  Other Topics Concern   Not on file  Social History Narrative   Not on file   Social Drivers of Health   Financial Resource Strain: Not on file  Food Insecurity: Not on file  Transportation Needs: Not on file  Physical Activity: Not on file  Stress: Not on file  Social Connections: Not on file  Intimate Partner Violence: Not on file      Review of Systems  Constitutional:  Negative for chills, fatigue and unexpected weight change.  HENT:  Positive for  congestion, postnasal drip and sinus pressure. Negative for rhinorrhea.   Eyes:  Negative for redness.  Respiratory:  Positive for cough. Negative for chest tightness and shortness of breath.   Cardiovascular:  Negative for chest pain and palpitations.  Gastrointestinal:  Negative for abdominal pain and constipation.  Genitourinary:  Negative for dysuria and frequency.  Musculoskeletal:  Negative for arthralgias and back pain.  Skin:  Negative for rash.  Neurological: Negative.  Negative for tremors and numbness.  Hematological:  Negative for adenopathy. Does not bruise/bleed easily.  Psychiatric/Behavioral:  Negative for behavioral problems (Depression), sleep disturbance and suicidal ideas. The patient is not nervous/anxious.     Vital Signs: BP 120/80   Pulse 84   Temp 97.8 F (36.6 C)   Resp 16   Ht 5\' 2"  (1.575 m)   Wt 211 lb 12.8 oz (96.1 kg)   LMP  (LMP Unknown)   SpO2 94%   BMI 38.74 kg/m    Physical Exam Vitals and nursing note reviewed.  Constitutional:      General: She is not in acute distress.    Appearance: She is well-developed. She is obese. She is not diaphoretic.  HENT:     Head: Normocephalic and atraumatic.     Right Ear: Tympanic membrane normal.     Left Ear: Tympanic membrane normal.     Nose: Congestion present.     Mouth/Throat:     Pharynx: No oropharyngeal exudate.  Eyes:     Pupils: Pupils are equal, round, and reactive to light.  Neck:     Thyroid: No thyromegaly.     Vascular: No JVD.     Trachea: No tracheal deviation.  Cardiovascular:     Rate and Rhythm: Normal rate and regular rhythm.     Heart sounds: Normal heart sounds. No murmur heard.    No friction rub. No gallop.  Pulmonary:     Effort: Pulmonary effort is normal. No respiratory distress.     Breath sounds: No wheezing or rales.  Chest:     Chest wall: No tenderness.  Breasts:    Right: Normal. No mass.     Left: Normal. No mass.  Abdominal:     General: Bowel sounds  are normal.     Palpations: Abdomen is soft.  Musculoskeletal:        General: Normal range of motion.     Cervical back: Normal range of motion and neck supple.  Lymphadenopathy:     Cervical: No cervical adenopathy.  Skin:    General: Skin is warm and dry.  Neurological:     Mental Status: She is alert  and oriented to person, place, and time.     Cranial Nerves: No cranial nerve deficit.  Psychiatric:        Behavior: Behavior normal.        Thought Content: Thought content normal.        Judgment: Judgment normal.        Assessment/Plan: 1. Acute non-recurrent frontal sinusitis (Primary) Will send zpak and may continue mucinex and nasal spray - azithromycin (ZITHROMAX) 250 MG tablet; Take 2 tablets on day 1, then 1 tablet daily on days 2 through 5  Dispense: 6 tablet; Refill: 0  2. OSA (obstructive sleep apnea) Continue nightly use  3. Obesity (BMI 30-39.9) May continue zepbound, may need to titrate up in future  4. B12 deficiency - cyanocobalamin (VITAMIN B12) injection 1,000 mcg   General Counseling: Sammi verbalizes understanding of the findings of todays visit and agrees with plan of treatment. I have discussed any further diagnostic evaluation that may be needed or ordered today. We also reviewed her medications today. she has been encouraged to call the office with any questions or concerns that should arise related to todays visit.    No orders of the defined types were placed in this encounter.   Meds ordered this encounter  Medications   cyanocobalamin (VITAMIN B12) injection 1,000 mcg   azithromycin (ZITHROMAX) 250 MG tablet    Sig: Take 2 tablets on day 1, then 1 tablet daily on days 2 through 5    Dispense:  6 tablet    Refill:  0    This patient was seen by Lynn Ito, PA-C in collaboration with Dr. Beverely Risen as a part of collaborative care agreement.   Total time spent:30 Minutes Time spent includes review of chart, medications, test  results, and follow up plan with the patient.      Dr Lyndon Code Internal medicine

## 2023-12-01 ENCOUNTER — Other Ambulatory Visit: Payer: Self-pay | Admitting: Physician Assistant

## 2023-12-01 DIAGNOSIS — G4733 Obstructive sleep apnea (adult) (pediatric): Secondary | ICD-10-CM | POA: Diagnosis not present

## 2023-12-12 ENCOUNTER — Encounter: Payer: Self-pay | Admitting: Physician Assistant

## 2023-12-12 ENCOUNTER — Ambulatory Visit (INDEPENDENT_AMBULATORY_CARE_PROVIDER_SITE_OTHER): Admitting: Physician Assistant

## 2023-12-12 VITALS — BP 121/68 | HR 71 | Temp 98.3°F | Resp 16 | Ht 62.0 in | Wt 210.0 lb

## 2023-12-12 DIAGNOSIS — E669 Obesity, unspecified: Secondary | ICD-10-CM | POA: Diagnosis not present

## 2023-12-12 DIAGNOSIS — E538 Deficiency of other specified B group vitamins: Secondary | ICD-10-CM | POA: Diagnosis not present

## 2023-12-12 DIAGNOSIS — G4733 Obstructive sleep apnea (adult) (pediatric): Secondary | ICD-10-CM

## 2023-12-12 MED ORDER — TIRZEPATIDE-WEIGHT MANAGEMENT 5 MG/0.5ML ~~LOC~~ SOLN: 5.0000 mg | SUBCUTANEOUS | 2 refills | Status: DC

## 2023-12-12 MED ORDER — CYANOCOBALAMIN 1000 MCG/ML IJ SOLN
1000.0000 ug | Freq: Once | INTRAMUSCULAR | Status: AC
Start: 2023-12-12 — End: 2023-12-12
  Administered 2023-12-12: 1000 ug via INTRAMUSCULAR

## 2023-12-12 NOTE — Progress Notes (Signed)
 [facility] [address] [place], [place] [address]  Internal MEDICINE  Office Visit Note  Patient Name: Ashley Mckay  [id number]  [id number]  Date of Service: 12/30/2023  Chief Complaint  Patient presents with   Follow-up   Hypertension   Weight Loss    HPI Pt is here for routine follow up for weight management -Has one more round of zepbound 2.5mg , but then would like to increase dose due to hitting plateau -tolerating well -continues to use CPAP nightly  Current Medication: Outpatient Encounter Medications as of 12/12/2023  Medication Sig Note   aspirin EC 81 MG tablet Take 81 mg by mouth daily. Swallow whole.    Docusate Calcium  (STOOL SOFTENER PO) Take by mouth.    ibuprofen (ADVIL,MOTRIN) 800 MG tablet 800 mg every 8 (eight) hours as needed.  06/21/2014: Received from: External Pharmacy   levocetirizine (XYZAL ) 5 MG tablet Take 1 tablet (5 mg total) by mouth every evening.    Magnesium 250 MG TABS Take by mouth.    ondansetron  (ZOFRAN ) 4 MG tablet Take 1 tablet (4 mg total) by mouth every 8 (eight) hours as needed for nausea or vomiting.    oxybutynin  (DITROPAN -XL) 10 MG 24 hr tablet TAKE ONE TABLET BY MOUTH DAILY AT BEDTIME    propranolol  ER (INDERAL  LA) 80 MG 24 hr capsule Take 1 capsule (80 mg total) by mouth daily.    rosuvastatin  (CRESTOR ) 10 MG tablet Take 1 tablet (10 mg total) by mouth daily.    tirzepatide 5 MG/0.5ML injection vial Inject 5 mg into the skin once a week.    triamcinolone  cream (KENALOG ) 0.1 % Apply 1 application topically 2 (two) times daily.    VITAMIN D , CHOLECALCIFEROL, PO Take 1,000 Int'l Units by mouth daily.    [DISCONTINUED] ZEPBOUND 2.5 MG/0.5ML injection vial INJECT 0.5 ML (2.5 MG) UNDER THE SKIN ONCE WEEKLY (0.5ML= 50 UNITS)    [EXPIRED] cyanocobalamin  (VITAMIN B12) injection 1,000 mcg     No facility-administered encounter medications on file as of 12/12/2023.    Surgical History: Past Surgical History:  Procedure  Laterality Date   COLONOSCOPY     COLONOSCOPY WITH PROPOFOL  N/A 01/31/2016   Procedure: COLONOSCOPY WITH PROPOFOL ;  Surgeon: Cassie Click, MD;  Location: [facility] ENDOSCOPY;  Service: Endoscopy;  Laterality: N/A;   ESOPHAGOGASTRODUODENOSCOPY     tooth implant     started in 2020 but finished dec 2021    Medical History: Past Medical History:  Diagnosis Date   Arthritis    Headache    History of chickenpox    Hypertension    Sleep apnea     Family History: Family History  Problem Relation Age of Onset   Breast cancer Mother 39    Social History   Socioeconomic History   Marital status: Married    Spouse name: Not on file   Number of children: Not on file   Years of education: Not on file   Highest education level: Not on file  Occupational History   Not on file  Tobacco Use   Smoking status: Never   Smokeless tobacco: Never  Substance and Sexual Activity   Alcohol use: Not Currently    Comment: rarely   Drug use: No   Sexual activity: Not on file  Other Topics Concern   Not on file  Social History Narrative   Not on file   Social Drivers of Health   Financial Resource Strain: Not on file  Food Insecurity:  Not on file  Transportation Needs: Not on file  Physical Activity: Not on file  Stress: Not on file  Social Connections: Not on file  Intimate Partner Violence: Not on file      Review of Systems  Constitutional:  Negative for chills, fatigue and unexpected weight change.  HENT:  Positive for postnasal drip. Negative for congestion and rhinorrhea.   Eyes:  Negative for redness.  Respiratory:  Negative for cough, chest tightness and shortness of breath.   Cardiovascular:  Negative for chest pain and palpitations.  Gastrointestinal:  Negative for abdominal pain and constipation.  Genitourinary:  Negative for dysuria and frequency.  Musculoskeletal:  Negative for arthralgias and back pain.  Skin:  Negative for rash.  Neurological: Negative.  Negative  for tremors and numbness.  Hematological:  Negative for adenopathy. Does not bruise/bleed easily.  Psychiatric/Behavioral:  Negative for behavioral problems (Depression), sleep disturbance and suicidal ideas. The patient is not nervous/anxious.     Vital Signs: BP 121/68   Pulse 71   Temp 98.3 F (36.8 C)   Resp 16   Ht 5\' 2"  (1.575 m)   Wt 210 lb (95.3 kg)   LMP  (LMP Unknown)   SpO2 93%   BMI 38.41 kg/m    Physical Exam Vitals and nursing note reviewed.  Constitutional:      General: She is not in acute distress.    Appearance: She is well-developed. She is obese. She is not diaphoretic.  HENT:     Head: Normocephalic and atraumatic.  Neck:     Thyroid : No thyromegaly.     Vascular: No JVD.     Trachea: No tracheal deviation.  Cardiovascular:     Rate and Rhythm: Normal rate and regular rhythm.     Heart sounds: Normal heart sounds. No murmur heard.    No friction rub. No gallop.  Pulmonary:     Effort: Pulmonary effort is normal. No respiratory distress.     Breath sounds: No wheezing.  Chest:  Breasts:    Right: Normal. No mass.     Left: Normal. No mass.  Musculoskeletal:        General: Normal range of motion.  Skin:    General: Skin is warm and dry.  Neurological:     Mental Status: She is alert and oriented to person, place, and time.     Cranial Nerves: No cranial nerve deficit.  Psychiatric:        Behavior: Behavior normal.        Thought Content: Thought content normal.        Judgment: Judgment normal.        Assessment/Plan: 1. OSA (obstructive sleep apnea) (Primary) Continue cpap nightly, continue with wt loss goals which may help apnea as well  2. Obesity (BMI 30-39.9) Will increase to 5mg  weekly injection and continue to work on diet and exercise - tirzepatide 5 MG/0.5ML injection vial; Inject 5 mg into the skin once a week.  Dispense: 2 mL; Refill: 2  3. B12 deficiency - cyanocobalamin  (VITAMIN B12) injection 1,000 mcg   General  Counseling: Ashley Mckay verbalizes understanding of the findings of todays visit and agrees with plan of treatment. I have discussed any further diagnostic evaluation that may be needed or ordered today. We also reviewed her medications today. she has been encouraged to call the office with any questions or concerns that should arise related to todays visit.    No orders of the defined types were placed in  this encounter.   Meds ordered this encounter  Medications   cyanocobalamin  (VITAMIN B12) injection 1,000 mcg   tirzepatide 5 MG/0.5ML injection vial    Sig: Inject 5 mg into the skin once a week.    Dispense:  2 mL    Refill:  2    This patient was seen by Taylor Favia, PA-C in collaboration with Dr. Verneta Gone as a part of collaborative care agreement.   Total time spent:30 Minutes Time spent includes review of chart, medications, test results, and follow up plan with the patient.      Dr Fozia M Khan Internal medicine

## 2023-12-26 DIAGNOSIS — G4733 Obstructive sleep apnea (adult) (pediatric): Secondary | ICD-10-CM | POA: Diagnosis not present

## 2024-01-16 ENCOUNTER — Ambulatory Visit: Admitting: Physician Assistant

## 2024-01-16 ENCOUNTER — Encounter: Payer: Self-pay | Admitting: Physician Assistant

## 2024-01-16 VITALS — BP 126/72 | HR 77 | Temp 98.3°F | Resp 16 | Ht 62.0 in | Wt 208.2 lb

## 2024-01-16 DIAGNOSIS — E669 Obesity, unspecified: Secondary | ICD-10-CM | POA: Diagnosis not present

## 2024-01-16 DIAGNOSIS — G4733 Obstructive sleep apnea (adult) (pediatric): Secondary | ICD-10-CM | POA: Diagnosis not present

## 2024-01-16 DIAGNOSIS — E538 Deficiency of other specified B group vitamins: Secondary | ICD-10-CM | POA: Diagnosis not present

## 2024-01-16 MED ORDER — CYANOCOBALAMIN 1000 MCG/ML IJ SOLN
1000.0000 ug | Freq: Once | INTRAMUSCULAR | Status: AC
Start: 2024-01-16 — End: 2024-01-16
  Administered 2024-01-16: 1000 ug via INTRAMUSCULAR

## 2024-01-16 NOTE — Progress Notes (Signed)
 [facility] [address] [place], [place] [address]  Internal MEDICINE  Office Visit Note  Patient Name: Ashley Mckay  [id number]  [id number]  Date of Service: 02/04/2024  Chief Complaint  Patient presents with   Hypertension   Follow-up    Weight loss    HPI Pt is here for routine follow up -Tolerating new dose of zepbound well, only has had 1 injection of the new dose so far -down another 2lbs since last visit -more active, has been working in garden and cutting weeds -appetite suppressed on injection and eating smaller portions -Staying well hydrated  Current Medication: Outpatient Encounter Medications as of 01/16/2024  Medication Sig Note   aspirin EC 81 MG tablet Take 81 mg by mouth daily. Swallow whole.    Docusate Calcium  (STOOL SOFTENER PO) Take by mouth.    ibuprofen (ADVIL,MOTRIN) 800 MG tablet 800 mg every 8 (eight) hours as needed.  06/21/2014: Received from: External Pharmacy   levocetirizine (XYZAL ) 5 MG tablet Take 1 tablet (5 mg total) by mouth every evening.    Magnesium 250 MG TABS Take by mouth.    ondansetron  (ZOFRAN ) 4 MG tablet Take 1 tablet (4 mg total) by mouth every 8 (eight) hours as needed for nausea or vomiting.    propranolol  ER (INDERAL  LA) 80 MG 24 hr capsule Take 1 capsule (80 mg total) by mouth daily.    rosuvastatin  (CRESTOR ) 10 MG tablet Take 1 tablet (10 mg total) by mouth daily.    tirzepatide 5 MG/0.5ML injection vial Inject 5 mg into the skin once a week.    triamcinolone  cream (KENALOG ) 0.1 % Apply 1 application topically 2 (two) times daily.    VITAMIN D , CHOLECALCIFEROL, PO Take 1,000 Int'l Units by mouth daily.    [DISCONTINUED] oxybutynin  (DITROPAN -XL) 10 MG 24 hr tablet TAKE ONE TABLET BY MOUTH DAILY AT BEDTIME    [EXPIRED] cyanocobalamin  (VITAMIN B12) injection 1,000 mcg     No facility-administered encounter medications on file as of 01/16/2024.    Surgical History: Past Surgical History:  Procedure Laterality Date    COLONOSCOPY     COLONOSCOPY WITH PROPOFOL  N/A 01/31/2016   Procedure: COLONOSCOPY WITH PROPOFOL ;  Surgeon: Cassie Click, MD;  Location: [facility] ENDOSCOPY;  Service: Endoscopy;  Laterality: N/A;   ESOPHAGOGASTRODUODENOSCOPY     tooth implant     started in 2020 but finished dec 2021    Medical History: Past Medical History:  Diagnosis Date   Arthritis    Headache    History of chickenpox    Hypertension    Sleep apnea     Family History: Family History  Problem Relation Age of Onset   Breast cancer Mother 70    Social History   Socioeconomic History   Marital status: Married    Spouse name: Not on file   Number of children: Not on file   Years of education: Not on file   Highest education level: Not on file  Occupational History   Not on file  Tobacco Use   Smoking status: Never   Smokeless tobacco: Never  Substance and Sexual Activity   Alcohol use: Not Currently    Comment: rarely   Drug use: No   Sexual activity: Not on file  Other Topics Concern   Not on file  Social History Narrative   Not on file   Social Drivers of Health   Financial Resource Strain: Not on file  Food Insecurity: Not on file  Transportation Needs: Not on file  Physical Activity: Not on file  Stress: Not on file  Social Connections: Not on file  Intimate Partner Violence: Not on file      Review of Systems  Constitutional:  Negative for chills, fatigue and unexpected weight change.  HENT:  Positive for postnasal drip. Negative for congestion and rhinorrhea.   Eyes:  Negative for redness.  Respiratory:  Negative for cough, chest tightness and shortness of breath.   Cardiovascular:  Negative for chest pain and palpitations.  Gastrointestinal:  Negative for abdominal pain and constipation.  Genitourinary:  Negative for dysuria and frequency.  Musculoskeletal:  Negative for arthralgias and back pain.  Skin:  Negative for rash.  Neurological: Negative.  Negative for tremors and  numbness.  Hematological:  Negative for adenopathy. Does not bruise/bleed easily.  Psychiatric/Behavioral:  Negative for behavioral problems (Depression), sleep disturbance and suicidal ideas. The patient is not nervous/anxious.     Vital Signs: BP 126/72   Pulse 77   Temp 98.3 F (36.8 C)   Resp 16   Ht 5\' 2"  (1.575 m)   Wt 208 lb 3.2 oz (94.4 kg)   LMP  (LMP Unknown)   SpO2 95%   BMI 38.08 kg/m    Physical Exam Vitals and nursing note reviewed.  Constitutional:      General: She is not in acute distress.    Appearance: She is well-developed. She is obese. She is not diaphoretic.  HENT:     Head: Normocephalic and atraumatic.  Neck:     Thyroid : No thyromegaly.     Vascular: No JVD.     Trachea: No tracheal deviation.  Cardiovascular:     Rate and Rhythm: Normal rate and regular rhythm.     Heart sounds: Normal heart sounds. No murmur heard.    No friction rub. No gallop.  Pulmonary:     Effort: Pulmonary effort is normal. No respiratory distress.     Breath sounds: No wheezing.  Chest:  Breasts:    Right: Normal. No mass.     Left: Normal. No mass.  Musculoskeletal:        General: Normal range of motion.  Skin:    General: Skin is warm and dry.  Neurological:     Mental Status: She is alert and oriented to person, place, and time.     Cranial Nerves: No cranial nerve deficit.  Psychiatric:        Behavior: Behavior normal.        Thought Content: Thought content normal.        Judgment: Judgment normal.        Assessment/Plan: 1. OSA (obstructive sleep apnea) (Primary) Continue cpap nightly and continue with wt loss goals which may helps as wel  2. Obesity (BMI 30-39.9) Continue 5mg  weekly zepbound and work on diet and exercise  3. B12 deficiency - cyanocobalamin  (VITAMIN B12) injection 1,000 mcg   General Counseling: [name] verbalizes understanding of the findings of todays visit and agrees with plan of treatment. I have discussed any further  diagnostic evaluation that may be needed or ordered today. We also reviewed her medications today. she has been encouraged to call the office with any questions or concerns that should arise related to todays visit.    No orders of the defined types were placed in this encounter.   Meds ordered this encounter  Medications   cyanocobalamin  (VITAMIN B12) injection 1,000 mcg    This patient was seen by Barbra Ley  Zanyah Lentsch, PA-C in collaboration with Dr. Verneta Gone as a part of collaborative care agreement.   Total time spent:25 Minutes Time spent includes review of chart, medications, test results, and follow up plan with the patient.      Dr Fozia M Khan Internal medicine

## 2024-01-25 ENCOUNTER — Other Ambulatory Visit: Payer: Self-pay | Admitting: Physician Assistant

## 2024-01-25 DIAGNOSIS — R32 Unspecified urinary incontinence: Secondary | ICD-10-CM

## 2024-01-29 DIAGNOSIS — G4733 Obstructive sleep apnea (adult) (pediatric): Secondary | ICD-10-CM | POA: Diagnosis not present

## 2024-02-12 DIAGNOSIS — H524 Presbyopia: Secondary | ICD-10-CM | POA: Diagnosis not present

## 2024-02-12 DIAGNOSIS — H52223 Regular astigmatism, bilateral: Secondary | ICD-10-CM | POA: Diagnosis not present

## 2024-02-12 DIAGNOSIS — H31093 Other chorioretinal scars, bilateral: Secondary | ICD-10-CM | POA: Diagnosis not present

## 2024-02-12 DIAGNOSIS — H5203 Hypermetropia, bilateral: Secondary | ICD-10-CM | POA: Diagnosis not present

## 2024-02-23 ENCOUNTER — Ambulatory Visit (INDEPENDENT_AMBULATORY_CARE_PROVIDER_SITE_OTHER): Admitting: Physician Assistant

## 2024-02-23 ENCOUNTER — Encounter: Payer: Self-pay | Admitting: Physician Assistant

## 2024-02-23 VITALS — BP 128/72 | HR 81 | Temp 98.3°F | Resp 16 | Ht 62.0 in | Wt 204.2 lb

## 2024-02-23 DIAGNOSIS — G4733 Obstructive sleep apnea (adult) (pediatric): Secondary | ICD-10-CM

## 2024-02-23 DIAGNOSIS — E669 Obesity, unspecified: Secondary | ICD-10-CM | POA: Diagnosis not present

## 2024-02-23 NOTE — Progress Notes (Signed)
 [facility] [address] [place], [place] [address]  Internal MEDICINE  Office Visit Note  Patient Name: Ashley Mckay  [id number]  [id number]  Date of Service: 02/23/2024  Chief Complaint  Patient presents with   Follow-up   Weight Loss   Hypertension    HPI Pt is here for routine follow up for weight management -doing well today and has no complaints -She has lost 4lbs since last visit -taking zepbound  5mg  weekly -tolerating well, happy at current dose and would like to continue -using cpap nightly -will hold B12 shots for a few months until labs rechecked  Current Medication: Outpatient Encounter Medications as of 02/23/2024  Medication Sig Note   aspirin EC 81 MG tablet Take 81 mg by mouth daily. Swallow whole.    Docusate Calcium  (STOOL SOFTENER PO) Take by mouth.    ibuprofen (ADVIL,MOTRIN) 800 MG tablet 800 mg every 8 (eight) hours as needed.  06/21/2014: Received from: External Pharmacy   levocetirizine (XYZAL ) 5 MG tablet Take 1 tablet (5 mg total) by mouth every evening.    Magnesium 250 MG TABS Take by mouth.    ondansetron  (ZOFRAN ) 4 MG tablet Take 1 tablet (4 mg total) by mouth every 8 (eight) hours as needed for nausea or vomiting.    oxybutynin  (DITROPAN -XL) 10 MG 24 hr tablet TAKE ONE TABLET BY MOUTH DAILY AT BEDTIME    propranolol  ER (INDERAL  LA) 80 MG 24 hr capsule Take 1 capsule (80 mg total) by mouth daily.    rosuvastatin  (CRESTOR ) 10 MG tablet Take 1 tablet (10 mg total) by mouth daily.    tirzepatide  5 MG/0.5ML injection vial Inject 5 mg into the skin once a week.    triamcinolone  cream (KENALOG ) 0.1 % Apply 1 application topically 2 (two) times daily.    VITAMIN D , CHOLECALCIFEROL, PO Take 1,000 Int'l Units by mouth daily.    No facility-administered encounter medications on file as of 02/23/2024.    Surgical History: Past Surgical History:  Procedure Laterality Date   COLONOSCOPY     COLONOSCOPY WITH PROPOFOL  N/A 01/31/2016    Procedure: COLONOSCOPY WITH PROPOFOL ;  Surgeon: Cassie Click, MD;  Location: [facility] ENDOSCOPY;  Service: Endoscopy;  Laterality: N/A;   ESOPHAGOGASTRODUODENOSCOPY     tooth implant     started in 2020 but finished dec 2021    Medical History: Past Medical History:  Diagnosis Date   Arthritis    Headache    History of chickenpox    Hypertension    Sleep apnea     Family History: Family History  Problem Relation Age of Onset   Breast cancer Mother 24    Social History   Socioeconomic History   Marital status: Married    Spouse name: Not on file   Number of children: Not on file   Years of education: Not on file   Highest education level: Not on file  Occupational History   Not on file  Tobacco Use   Smoking status: Never   Smokeless tobacco: Never  Substance and Sexual Activity   Alcohol use: Not Currently    Comment: rarely   Drug use: No   Sexual activity: Not on file  Other Topics Concern   Not on file  Social History Narrative   Not on file   Social Drivers of Health   Financial Resource Strain: Not on file  Food Insecurity: Not on file  Transportation Needs: Not on file  Physical Activity: Not on file  Stress: Not on file  Social Connections: Not on file  Intimate Partner Violence: Not on file      Review of Systems  Constitutional:  Negative for chills, fatigue and unexpected weight change.  HENT:  Positive for postnasal drip. Negative for congestion and rhinorrhea.   Eyes:  Negative for redness.  Respiratory:  Negative for cough, chest tightness and shortness of breath.   Cardiovascular:  Negative for chest pain and palpitations.  Gastrointestinal:  Negative for abdominal pain and constipation.  Genitourinary:  Negative for dysuria and frequency.  Musculoskeletal:  Negative for arthralgias and back pain.  Skin:  Negative for rash.  Neurological: Negative.  Negative for tremors and numbness.  Hematological:  Negative for adenopathy. Does not  bruise/bleed easily.  Psychiatric/Behavioral:  Negative for behavioral problems (Depression), sleep disturbance and suicidal ideas. The patient is not nervous/anxious.     Vital Signs: BP 128/72   Pulse 81   Temp 98.3 F (36.8 C)   Resp 16   Ht 5' 2 (1.575 m)   Wt 204 lb 3.2 oz (92.6 kg)   LMP  (LMP Unknown)   SpO2 94%   BMI 37.35 kg/m    Physical Exam Vitals and nursing note reviewed.  Constitutional:      General: She is not in acute distress.    Appearance: She is well-developed. She is obese. She is not diaphoretic.  HENT:     Head: Normocephalic and atraumatic.  Neck:     Thyroid : No thyromegaly.     Vascular: No JVD.     Trachea: No tracheal deviation.   Cardiovascular:     Rate and Rhythm: Normal rate and regular rhythm.     Heart sounds: Normal heart sounds. No murmur heard.    No friction rub. No gallop.  Pulmonary:     Effort: Pulmonary effort is normal. No respiratory distress.     Breath sounds: No wheezing.  Chest:  Breasts:    Right: Normal. No mass.     Left: Normal. No mass.   Musculoskeletal:        General: Normal range of motion.   Skin:    General: Skin is warm and dry.   Neurological:     Mental Status: She is alert and oriented to person, place, and time.   Psychiatric:        Behavior: Behavior normal.        Thought Content: Thought content normal.        Judgment: Judgment normal.        Assessment/Plan: 1. OSA (obstructive sleep apnea) (Primary) Continue cpap nightly and continue to work on wt loss  2. Obesity (BMI 30-39.9) Down 4lbs since last visit, continue zepbound  weekly and working on diet and exercise   General Counseling: Ashley Mckay verbalizes understanding of the findings of todays visit and agrees with plan of treatment. I have discussed any further diagnostic evaluation that may be needed or ordered today. We also reviewed her medications today. she has been encouraged to call the office with any questions or  concerns that should arise related to todays visit.    No orders of the defined types were placed in this encounter.   No orders of the defined types were placed in this encounter.   This patient was seen by Taylor Favia, PA-C in collaboration with Dr. Verneta Gone as a part of collaborative care agreement.   Total time spent:25 Minutes Time spent includes review of chart, medications, test results, and follow  up plan with the patient.      Dr Fozia M Khan Internal medicine

## 2024-03-02 DIAGNOSIS — G4733 Obstructive sleep apnea (adult) (pediatric): Secondary | ICD-10-CM | POA: Diagnosis not present

## 2024-03-22 ENCOUNTER — Other Ambulatory Visit: Payer: Self-pay | Admitting: Physician Assistant

## 2024-03-22 ENCOUNTER — Encounter: Payer: Self-pay | Admitting: Physician Assistant

## 2024-03-22 ENCOUNTER — Ambulatory Visit (INDEPENDENT_AMBULATORY_CARE_PROVIDER_SITE_OTHER): Admitting: Physician Assistant

## 2024-03-22 VITALS — BP 125/70 | HR 92 | Temp 98.7°F | Resp 16 | Ht 62.0 in | Wt 199.0 lb

## 2024-03-22 DIAGNOSIS — E669 Obesity, unspecified: Secondary | ICD-10-CM

## 2024-03-22 DIAGNOSIS — G4733 Obstructive sleep apnea (adult) (pediatric): Secondary | ICD-10-CM | POA: Diagnosis not present

## 2024-03-22 DIAGNOSIS — R197 Diarrhea, unspecified: Secondary | ICD-10-CM

## 2024-03-22 NOTE — Progress Notes (Signed)
 [facility] [address] [place], [place] [address]  Internal MEDICINE  Office Visit Note  Patient Name: Ashley Mckay  [id number]  [id number]  Date of Service: 03/22/2024  Chief Complaint  Patient presents with   Follow-up   Weight Loss   Hypertension   Diarrhea    HPI Pt is here for routine follow up for wt management on zepbound  -Was out of town this past week sat-sat and did her shot on Monday, but then had some diarrhea tues/wed and is still present -took immodium this morning -did eat fresh eggs as well as eating out more, went to [employer] and also had burger and fries and may have also been grease/diet change that upset stomach -previously had been doing well with zepbound  and tolerating well, no issues with it before. May hold off a few days and try again to see if doing better -went to eye doctor  -down 5lbs since last visit, will hold off on new refill until she calls if tolerating well and diarrhea subsides -no N, V, or abdominal pain. No blood in stools. Staying hydrated. Appetite is ok and otherwise feels well  Current Medication: Outpatient Encounter Medications as of 03/22/2024  Medication Sig Note   aspirin EC 81 MG tablet Take 81 mg by mouth daily. Swallow whole.    Docusate Calcium  (STOOL SOFTENER PO) Take by mouth.    ibuprofen (ADVIL,MOTRIN) 800 MG tablet 800 mg every 8 (eight) hours as needed.  06/21/2014: Received from: External Pharmacy   levocetirizine (XYZAL ) 5 MG tablet Take 1 tablet (5 mg total) by mouth every evening.    Magnesium 250 MG TABS Take by mouth.    ondansetron  (ZOFRAN ) 4 MG tablet Take 1 tablet (4 mg total) by mouth every 8 (eight) hours as needed for nausea or vomiting.    oxybutynin  (DITROPAN -XL) 10 MG 24 hr tablet TAKE ONE TABLET BY MOUTH DAILY AT BEDTIME    propranolol  ER (INDERAL  LA) 80 MG 24 hr capsule Take 1 capsule (80 mg total) by mouth daily.    rosuvastatin  (CRESTOR ) 10 MG tablet Take 1 tablet (10 mg total)  by mouth daily.    tirzepatide  5 MG/0.5ML injection vial Inject 5 mg into the skin once a week.    triamcinolone  cream (KENALOG ) 0.1 % Apply 1 application topically 2 (two) times daily.    VITAMIN D , CHOLECALCIFEROL, PO Take 1,000 Int'l Units by mouth daily.    No facility-administered encounter medications on file as of 03/22/2024.    Surgical History: Past Surgical History:  Procedure Laterality Date   COLONOSCOPY     COLONOSCOPY WITH PROPOFOL  N/A 01/31/2016   Procedure: COLONOSCOPY WITH PROPOFOL ;  Surgeon: Lamar ONEIDA Holmes, MD;  Location: [facility] ENDOSCOPY;  Service: Endoscopy;  Laterality: N/A;   ESOPHAGOGASTRODUODENOSCOPY     tooth implant     started in 2020 but finished dec 2021    Medical History: Past Medical History:  Diagnosis Date   Arthritis    Headache    History of chickenpox    Hypertension    Sleep apnea     Family History: Family History  Problem Relation Age of Onset   Breast cancer Mother 87    Social History   Socioeconomic History   Marital status: Married    Spouse name: Not on file   Number of children: Not on file   Years of education: Not on file   Highest education level: Not on file  Occupational History   Not  on file  Tobacco Use   Smoking status: Never   Smokeless tobacco: Never  Substance and Sexual Activity   Alcohol use: Not Currently    Comment: rarely   Drug use: No   Sexual activity: Not on file  Other Topics Concern   Not on file  Social History Narrative   Not on file   Social Drivers of Health   Financial Resource Strain: Not on file  Food Insecurity: Not on file  Transportation Needs: Not on file  Physical Activity: Not on file  Stress: Not on file  Social Connections: Not on file  Intimate Partner Violence: Not on file      Review of Systems  Constitutional:  Negative for chills, fatigue and unexpected weight change.  HENT:  Positive for postnasal drip. Negative for congestion and rhinorrhea.   Eyes:   Negative for redness.  Respiratory:  Negative for cough, chest tightness and shortness of breath.   Cardiovascular:  Negative for chest pain and palpitations.  Gastrointestinal:  Positive for diarrhea. Negative for abdominal pain, blood in stool, constipation, nausea and vomiting.  Genitourinary:  Negative for dysuria and frequency.  Musculoskeletal:  Negative for arthralgias and back pain.  Skin:  Negative for rash.  Neurological: Negative.  Negative for tremors and numbness.  Hematological:  Negative for adenopathy. Does not bruise/bleed easily.  Psychiatric/Behavioral:  Negative for behavioral problems (Depression), sleep disturbance and suicidal ideas. The patient is not nervous/anxious.     Vital Signs: BP 125/70   Pulse 92   Temp 98.7 F (37.1 C)   Resp 16   Ht 5' 2 (1.575 m)   Wt 199 lb (90.3 kg)   LMP  (LMP Unknown)   SpO2 95%   BMI 36.40 kg/m    Physical Exam Vitals and nursing note reviewed.  Constitutional:      General: She is not in acute distress.    Appearance: She is well-developed. She is obese. She is not diaphoretic.  HENT:     Head: Normocephalic and atraumatic.  Neck:     Thyroid : No thyromegaly.     Vascular: No JVD.     Trachea: No tracheal deviation.  Cardiovascular:     Rate and Rhythm: Normal rate and regular rhythm.     Heart sounds: Normal heart sounds. No murmur heard.    No friction rub. No gallop.  Pulmonary:     Effort: Pulmonary effort is normal. No respiratory distress.     Breath sounds: No wheezing.  Chest:  Breasts:    Right: Normal. No mass.     Left: Normal. No mass.  Abdominal:     Tenderness: There is no abdominal tenderness.  Musculoskeletal:        General: Normal range of motion.  Skin:    General: Skin is warm and dry.  Neurological:     Mental Status: She is alert and oriented to person, place, and time.  Psychiatric:        Behavior: Behavior normal.        Thought Content: Thought content normal.         Judgment: Judgment normal.        Assessment/Plan: 1. Diarrhea, unspecified type (Primary) Will stay hydrated and monitor, call if not improving  2. Obesity (BMI 30-39.9) May hold zepbound  a few days to see if diarrhea subsides and may trial another round since previously well tolerated. Will call if wanting refill if tolerated at next dose  3. OSA (obstructive sleep  apnea) Continue cpap nightly   General Counseling: Ashley Mckay verbalizes understanding of the findings of todays visit and agrees with plan of treatment. I have discussed any further diagnostic evaluation that may be needed or ordered today. We also reviewed her medications today. she has been encouraged to call the office with any questions or concerns that should arise related to todays visit.    No orders of the defined types were placed in this encounter.   No orders of the defined types were placed in this encounter.   This patient was seen by Tinnie Pro, PA-C in collaboration with Dr. Sigrid Bathe as a part of collaborative care agreement.   Total time spent:30 Minutes Time spent includes review of chart, medications, test results, and follow up plan with the patient.      Dr Fozia M Khan Internal medicine

## 2024-03-30 ENCOUNTER — Telehealth: Payer: Self-pay

## 2024-03-31 NOTE — Telephone Encounter (Signed)
 Patient notified

## 2024-04-22 ENCOUNTER — Other Ambulatory Visit: Payer: Self-pay | Admitting: Physician Assistant

## 2024-04-22 DIAGNOSIS — E782 Mixed hyperlipidemia: Secondary | ICD-10-CM

## 2024-04-26 ENCOUNTER — Encounter: Payer: Self-pay | Admitting: Physician Assistant

## 2024-04-26 ENCOUNTER — Ambulatory Visit (INDEPENDENT_AMBULATORY_CARE_PROVIDER_SITE_OTHER): Admitting: Physician Assistant

## 2024-04-26 VITALS — BP 121/70 | HR 84 | Temp 98.6°F | Resp 16 | Ht 62.0 in | Wt 201.0 lb

## 2024-04-26 DIAGNOSIS — R5383 Other fatigue: Secondary | ICD-10-CM

## 2024-04-26 DIAGNOSIS — R7989 Other specified abnormal findings of blood chemistry: Secondary | ICD-10-CM

## 2024-04-26 DIAGNOSIS — E559 Vitamin D deficiency, unspecified: Secondary | ICD-10-CM

## 2024-04-26 DIAGNOSIS — E538 Deficiency of other specified B group vitamins: Secondary | ICD-10-CM

## 2024-04-26 DIAGNOSIS — I10 Essential (primary) hypertension: Secondary | ICD-10-CM | POA: Diagnosis not present

## 2024-04-26 DIAGNOSIS — E669 Obesity, unspecified: Secondary | ICD-10-CM | POA: Diagnosis not present

## 2024-04-26 DIAGNOSIS — E782 Mixed hyperlipidemia: Secondary | ICD-10-CM | POA: Diagnosis not present

## 2024-04-26 MED ORDER — PROPRANOLOL HCL ER 80 MG PO CP24: 80.0000 mg | ORAL_CAPSULE | Freq: Every day | ORAL | 3 refills | Status: AC

## 2024-04-26 NOTE — Progress Notes (Signed)
 [facility] [address] [place], [place] [address]  Internal MEDICINE  Office Visit Note  Patient Name: Ashley Mckay  [id number]  [id number]  Date of Service: 04/27/2024  Chief Complaint  Patient presents with   Follow-up   Weight Loss   Hypertension    HPI Pt is here for routine follow up -Did gain 2lbs since last visit but also wasn't feeling well. Doing better now. Thinks it was just a bug not the zepbound  -was on vacation again and just got back on Saturday and therefore wasn't in her normal routine with diet and exercise -Tolerating zepbound  again and will continue -BP stable -Will order labs for CPE  Current Medication: Outpatient Encounter Medications as of 04/26/2024  Medication Sig Note   aspirin EC 81 MG tablet Take 81 mg by mouth daily. Swallow whole.    Docusate Calcium  (STOOL SOFTENER PO) Take by mouth.    ibuprofen (ADVIL,MOTRIN) 800 MG tablet 800 mg every 8 (eight) hours as needed.  06/21/2014: Received from: External Pharmacy   levocetirizine (XYZAL ) 5 MG tablet Take 1 tablet (5 mg total) by mouth every evening.    Magnesium 250 MG TABS Take by mouth.    ondansetron  (ZOFRAN ) 4 MG tablet Take 1 tablet (4 mg total) by mouth every 8 (eight) hours as needed for nausea or vomiting.    oxybutynin  (DITROPAN -XL) 10 MG 24 hr tablet TAKE ONE TABLET BY MOUTH DAILY AT BEDTIME    rosuvastatin  (CRESTOR ) 10 MG tablet TAKE ONE TABLET BY MOUTH ONCE A DAY    triamcinolone  cream (KENALOG ) 0.1 % Apply 1 application topically 2 (two) times daily.    VITAMIN D , CHOLECALCIFEROL, PO Take 1,000 Int'l Units by mouth daily.    ZEPBOUND  5 MG/0.5ML injection vial INJECT 0.5 ML (5 MG) UNDER THE SKIN ONCE WEEKLY (0.5ML= 50 UNITS)    [DISCONTINUED] propranolol  ER (INDERAL  LA) 80 MG 24 hr capsule Take 1 capsule (80 mg total) by mouth daily.    propranolol  ER (INDERAL  LA) 80 MG 24 hr capsule Take 1 capsule (80 mg total) by mouth daily.    No facility-administered encounter  medications on file as of 04/26/2024.    Surgical History: Past Surgical History:  Procedure Laterality Date   COLONOSCOPY     COLONOSCOPY WITH PROPOFOL  N/A 01/31/2016   Procedure: COLONOSCOPY WITH PROPOFOL ;  Surgeon: Lamar ONEIDA Holmes, MD;  Location: [facility] ENDOSCOPY;  Service: Endoscopy;  Laterality: N/A;   ESOPHAGOGASTRODUODENOSCOPY     tooth implant     started in 2020 but finished dec 2021    Medical History: Past Medical History:  Diagnosis Date   Arthritis    Headache    History of chickenpox    Hypertension    Sleep apnea     Family History: Family History  Problem Relation Age of Onset   Breast cancer Mother 54    Social History   Socioeconomic History   Marital status: Married    Spouse name: Not on file   Number of children: Not on file   Years of education: Not on file   Highest education level: Not on file  Occupational History   Not on file  Tobacco Use   Smoking status: Never   Smokeless tobacco: Never  Substance and Sexual Activity   Alcohol use: Not Currently    Comment: rarely   Drug use: No   Sexual activity: Not on file  Other Topics Concern   Not on file  Social History Narrative  Not on file   Social Drivers of Health   Financial Resource Strain: Not on file  Food Insecurity: Not on file  Transportation Needs: Not on file  Physical Activity: Not on file  Stress: Not on file  Social Connections: Not on file  Intimate Partner Violence: Not on file      Review of Systems  Constitutional:  Negative for chills, fatigue and unexpected weight change.  HENT:  Positive for postnasal drip. Negative for congestion and rhinorrhea.   Eyes:  Negative for redness.  Respiratory:  Negative for cough, chest tightness and shortness of breath.   Cardiovascular:  Negative for chest pain and palpitations.  Gastrointestinal:  Negative for abdominal pain and constipation.  Genitourinary:  Negative for dysuria and frequency.  Musculoskeletal:   Negative for arthralgias and back pain.  Skin:  Negative for rash.  Neurological: Negative.  Negative for tremors and numbness.  Hematological:  Negative for adenopathy. Does not bruise/bleed easily.  Psychiatric/Behavioral:  Negative for behavioral problems (Depression), sleep disturbance and suicidal ideas. The patient is not nervous/anxious.     Vital Signs: BP 121/70   Pulse 84   Temp 98.6 F (37 C)   Resp 16   Ht 5' 2 (1.575 m)   Wt 201 lb (91.2 kg)   LMP  (LMP Unknown)   SpO2 96%   BMI 36.76 kg/m    Physical Exam Vitals and nursing note reviewed.  Constitutional:      General: She is not in acute distress.    Appearance: She is well-developed. She is obese. She is not diaphoretic.  HENT:     Head: Normocephalic and atraumatic.  Neck:     Thyroid : No thyromegaly.     Vascular: No JVD.     Trachea: No tracheal deviation.  Cardiovascular:     Rate and Rhythm: Normal rate and regular rhythm.     Heart sounds: Normal heart sounds. No murmur heard.    No friction rub. No gallop.  Pulmonary:     Effort: Pulmonary effort is normal. No respiratory distress.     Breath sounds: No wheezing.  Chest:  Breasts:    Right: Normal. No mass.     Left: Normal. No mass.  Musculoskeletal:        General: Normal range of motion.  Skin:    General: Skin is warm and dry.  Neurological:     Mental Status: She is alert and oriented to person, place, and time.  Psychiatric:        Behavior: Behavior normal.        Thought Content: Thought content normal.        Judgment: Judgment normal.        Assessment/Plan: 1. Essential hypertension (Primary) Stable, continue current medication - propranolol  ER (INDERAL  LA) 80 MG 24 hr capsule; Take 1 capsule (80 mg total) by mouth daily.  Dispense: 90 capsule; Refill: 3  2. Obesity (BMI 30-39.9) May continue zepbound  and working on diet and exercise  3. Mixed hyperlipidemia Continue crestor  and will check labs - Lipid Panel With  LDL/HDL Ratio  4. Abnormal thyroid  blood test - TSH + free T4  5. B12 deficiency - B12 and Folate Panel  6. Vitamin D  deficiency - VITAMIN D  25 Hydroxy (Vit-D Deficiency, Fractures)  7. Other fatigue - CBC w/Diff/Platelet - Comprehensive metabolic panel with GFR - TSH + free T4 - Lipid Panel With LDL/HDL Ratio - B12 and Folate Panel - VITAMIN D  25 Hydroxy (Vit-D Deficiency,  Fractures)   General Counseling: Roe verbalizes understanding of the findings of todays visit and agrees with plan of treatment. I have discussed any further diagnostic evaluation that may be needed or ordered today. We also reviewed her medications today. she has been encouraged to call the office with any questions or concerns that should arise related to todays visit.    Orders Placed This Encounter  Procedures   CBC w/Diff/Platelet   Comprehensive metabolic panel with GFR   TSH + free T4   Lipid Panel With LDL/HDL Ratio   B12 and Folate Panel   VITAMIN D  25 Hydroxy (Vit-D Deficiency, Fractures)    Meds ordered this encounter  Medications   propranolol  ER (INDERAL  LA) 80 MG 24 hr capsule    Sig: Take 1 capsule (80 mg total) by mouth daily.    Dispense:  90 capsule    Refill:  3    This patient was seen by Tinnie Pro, PA-C in collaboration with Dr. Sigrid Bathe as a part of collaborative care agreement.   Total time spent:30 Minutes Time spent includes review of chart, medications, test results, and follow up plan with the patient.      Dr Fozia M Khan Internal medicine

## 2024-05-01 ENCOUNTER — Other Ambulatory Visit: Payer: Self-pay | Admitting: Physician Assistant

## 2024-05-01 DIAGNOSIS — R32 Unspecified urinary incontinence: Secondary | ICD-10-CM

## 2024-05-01 DIAGNOSIS — J301 Allergic rhinitis due to pollen: Secondary | ICD-10-CM

## 2024-05-03 ENCOUNTER — Other Ambulatory Visit
Admission: RE | Admit: 2024-05-03 | Discharge: 2024-05-03 | Disposition: A | Discharge status: Home / Self Care | Attending: Physician Assistant | Admitting: Physician Assistant

## 2024-05-03 DIAGNOSIS — R5383 Other fatigue: Secondary | ICD-10-CM | POA: Diagnosis not present

## 2024-05-03 DIAGNOSIS — R7989 Other specified abnormal findings of blood chemistry: Secondary | ICD-10-CM | POA: Insufficient documentation

## 2024-05-03 DIAGNOSIS — E538 Deficiency of other specified B group vitamins: Secondary | ICD-10-CM | POA: Diagnosis not present

## 2024-05-03 DIAGNOSIS — E782 Mixed hyperlipidemia: Secondary | ICD-10-CM | POA: Insufficient documentation

## 2024-05-03 DIAGNOSIS — E559 Vitamin D deficiency, unspecified: Secondary | ICD-10-CM | POA: Insufficient documentation

## 2024-05-03 LAB — COMPREHENSIVE METABOLIC PANEL WITH GFR
ALT: 11 U/L (ref 0–44)
AST: 21 U/L (ref 15–41)
Albumin: 3.9 g/dL (ref 3.5–5.0)
Alkaline Phosphatase: 80 U/L (ref 38–126)
Anion gap: 11 (ref 5–15)
BUN: 16 mg/dL (ref 8–23)
CO2: 26 mmol/L (ref 22–32)
Calcium: 9.5 mg/dL (ref 8.9–10.3)
Chloride: 105 mmol/L (ref 98–111)
Creatinine, Ser: 0.67 mg/dL (ref 0.44–1.00)
GFR, Estimated: >60 mL/min (ref >60)
Glucose, Bld: 99 mg/dL (ref 70–99)
Potassium: 3.7 mmol/L (ref 3.5–5.1)
Sodium: 142 mmol/L (ref 135–145)
Total Bilirubin: 0.6 mg/dL (ref 0.0–1.2)
Total Protein: 7.6 g/dL (ref 6.5–8.1)

## 2024-05-03 LAB — FOLATE: Folate: 16.6 ng/mL (ref >5.9)

## 2024-05-03 LAB — CBC WITH DIFFERENTIAL/PLATELET
Abs Immature Granulocytes: 0.02 K/uL (ref 0.00–0.07)
Basophils Absolute: 0.1 K/uL (ref 0.0–0.1)
Basophils Relative: 1 %
Eosinophils Absolute: 0.2 K/uL (ref 0.0–0.5)
Eosinophils Relative: 3 %
HCT: 42.1 % (ref 36.0–46.0)
Hemoglobin: 13.7 g/dL (ref 12.0–15.0)
Immature Granulocytes: 0 %
Lymphocytes Relative: 18 %
Lymphs Abs: 1.3 K/uL (ref 0.7–4.0)
MCH: 30.8 pg (ref 26.0–34.0)
MCHC: 32.5 g/dL (ref 30.0–36.0)
MCV: 94.6 fL (ref 80.0–100.0)
Monocytes Absolute: 0.8 K/uL (ref 0.1–1.0)
Monocytes Relative: 10 %
Neutro Abs: 5 K/uL (ref 1.7–7.7)
Neutrophils Relative %: 68 %
Platelets: 281 K/uL (ref 150–400)
RBC: 4.45 MIL/uL (ref 3.87–5.11)
RDW: 13.2 % (ref 11.5–15.5)
WBC: 7.3 K/uL (ref 4.0–10.5)
nRBC: 0 % (ref 0.0–0.2)

## 2024-05-03 LAB — VITAMIN B12: Vitamin B-12: 234 pg/mL (ref 180–914)

## 2024-05-03 LAB — LIPID PANEL
Cholesterol: 150 mg/dL (ref 0–200)
HDL: 49 mg/dL (ref >40)
LDL Cholesterol: 73 mg/dL (ref 0–99)
Total CHOL/HDL Ratio: 3.1 ratio
Triglycerides: 140 mg/dL (ref <150)
VLDL: 28 mg/dL (ref 0–40)

## 2024-05-03 LAB — TSH: TSH: 2.728 u[IU]/mL (ref 0.350–4.500)

## 2024-05-03 LAB — T4, FREE: Free T4: 0.85 ng/dL (ref 0.61–1.12)

## 2024-05-03 LAB — VITAMIN D 25 HYDROXY (VIT D DEFICIENCY, FRACTURES): Vit D, 25-Hydroxy: 39.28 ng/mL (ref 30–100)

## 2024-05-13 ENCOUNTER — Encounter: Payer: Self-pay | Admitting: Physician Assistant

## 2024-05-13 ENCOUNTER — Telehealth: Payer: Self-pay | Admitting: Physician Assistant

## 2024-05-13 VITALS — Temp 97.0°F | Resp 16 | Ht 62.0 in | Wt 194.0 lb

## 2024-05-13 DIAGNOSIS — U071 COVID-19: Secondary | ICD-10-CM | POA: Diagnosis not present

## 2024-05-13 MED ORDER — AZITHROMYCIN 250 MG PO TABS: ORAL_TABLET | ORAL | 0 refills | Status: AC

## 2024-05-13 NOTE — Progress Notes (Signed)
 [facility] [address] [place], [place] [address]  Internal MEDICINE  Telephone Visit  Patient Name: Ashley Mckay  [id number]  [id number]  Date of Service: 05/13/2024  I connected with the patient at 9:49 by telephone and verified the patients identity using two identifiers.   I discussed the limitations, risks, security and privacy concerns of performing an evaluation and management service by telephone and the availability of in person appointments. I also discussed with the patient that there may be a patient responsible charge related to the service.  The patient expressed understanding and agrees to proceed.    Chief Complaint  Patient presents with   Telephone Assessment    Covid positive    Telephone Screen   Sore Throat   Headache   Sinusitis   Cough    HPI Pt is here fore virtual sick visit -husband had a stroke on the 21st and is doing rehab at [facility]. Thinks she may have caught something going in and out of the hospital -started feeling sick with sore throat Sat then Monday and Tues worse with congestion, headaches, and drainage and left ear pain -she has been gargling with salt water, nasal spray, mucinex, Cepacol for sore throat -coughing more yesterday, no SOB and wheezing -covid positive Tuesday  Current Medication: Outpatient Encounter Medications as of 05/13/2024  Medication Sig Note   aspirin EC 81 MG tablet Take 81 mg by mouth daily. Swallow whole.    azithromycin  (ZITHROMAX ) 250 MG tablet Take 2 tablets on day 1, then 1 tablet daily on days 2 through 5    Docusate Calcium  (STOOL SOFTENER PO) Take by mouth.    ibuprofen (ADVIL,MOTRIN) 800 MG tablet 800 mg every 8 (eight) hours as needed.  06/21/2014: Received from: External Pharmacy   levocetirizine (XYZAL ) 5 MG tablet TAKE ONE TABLET BY MOUTH DAILY IN THE EVENING    Magnesium 250 MG TABS Take by mouth.    ondansetron  (ZOFRAN ) 4 MG tablet Take 1 tablet (4 mg total) by mouth every 8 (eight)  hours as needed for nausea or vomiting.    oxybutynin  (DITROPAN -XL) 10 MG 24 hr tablet TAKE ONE TABLET BY MOUTH DAILY AT BEDTIME    propranolol  ER (INDERAL  LA) 80 MG 24 hr capsule Take 1 capsule (80 mg total) by mouth daily.    rosuvastatin  (CRESTOR ) 10 MG tablet TAKE ONE TABLET BY MOUTH ONCE A DAY    triamcinolone  cream (KENALOG ) 0.1 % Apply 1 application topically 2 (two) times daily.    VITAMIN D , CHOLECALCIFEROL, PO Take 1,000 Int'l Units by mouth daily.    ZEPBOUND  5 MG/0.5ML injection vial INJECT 0.5 ML (5 MG) UNDER THE SKIN ONCE WEEKLY (0.5ML= 50 UNITS)    No facility-administered encounter medications on file as of 05/13/2024.    Surgical History: Past Surgical History:  Procedure Laterality Date   COLONOSCOPY     COLONOSCOPY WITH PROPOFOL  N/A 01/31/2016   Procedure: COLONOSCOPY WITH PROPOFOL ;  Surgeon: Lamar ONEIDA Holmes, MD;  Location: [facility] ENDOSCOPY;  Service: Endoscopy;  Laterality: N/A;   ESOPHAGOGASTRODUODENOSCOPY     tooth implant     started in 2020 but finished dec 2021    Medical History: Past Medical History:  Diagnosis Date   Arthritis    Headache    History of chickenpox    Hypertension    Sleep apnea     Family History: Family History  Problem Relation Age of Onset   Breast cancer Mother 22    Social  History   Socioeconomic History   Marital status: Married    Spouse name: Not on file   Number of children: Not on file   Years of education: Not on file   Highest education level: Not on file  Occupational History   Not on file  Tobacco Use   Smoking status: Never   Smokeless tobacco: Never  Substance and Sexual Activity   Alcohol use: Not Currently    Comment: rarely   Drug use: No   Sexual activity: Not on file  Other Topics Concern   Not on file  Social History Narrative   Not on file   Social Drivers of Health   Financial Resource Strain: Not on file  Food Insecurity: Not on file  Transportation Needs: Not on file  Physical Activity:  Not on file  Stress: Not on file  Social Connections: Not on file  Intimate Partner Violence: Not on file      Review of Systems  Constitutional:  Negative for chills, fatigue and fever.  HENT:  Positive for congestion, ear pain, postnasal drip, sinus pressure and sore throat. Negative for mouth sores.   Respiratory:  Positive for cough. Negative for shortness of breath and wheezing.   Cardiovascular:  Negative for chest pain.  Gastrointestinal:  Negative for nausea and vomiting.  Genitourinary:  Negative for flank pain.  Neurological:  Positive for headaches.  Psychiatric/Behavioral: Negative.      Vital Signs: Temp (!) 97 F (36.1 C)   Resp 16   Ht 5' 2 (1.575 m)   Wt 194 lb (88 kg)   LMP  (LMP Unknown)   BMI 35.48 kg/m    Observation/Objective:  Pt is able to carry out conversation   Assessment/Plan: 1. Acute COVID-19 (Primary) Continue symptomatic treatment with OTC nasal spray, mucinex, and throat lozenges as needed. Will send zpak in case congestion is not improving. Pt states she will only take it if needed - azithromycin  (ZITHROMAX ) 250 MG tablet; Take 2 tablets on day 1, then 1 tablet daily on days 2 through 5  Dispense: 6 tablet; Refill: 0   General Counseling: Jheri verbalizes understanding of the findings of today's phone visit and agrees with plan of treatment. I have discussed any further diagnostic evaluation that may be needed or ordered today. We also reviewed her medications today. she has been encouraged to call the office with any questions or concerns that should arise related to todays visit.    No orders of the defined types were placed in this encounter.   Meds ordered this encounter  Medications   azithromycin  (ZITHROMAX ) 250 MG tablet    Sig: Take 2 tablets on day 1, then 1 tablet daily on days 2 through 5    Dispense:  6 tablet    Refill:  0    Time spent:25 Minutes    Dr Sigrid CHRISTELLA Bathe Internal medicine

## 2024-05-31 NOTE — Progress Notes (Signed)
   05/31/2024  Patient ID: Ashley Mckay, female   DOB: 21-Jun-1951, 73 y.o.   MRN: [id number]  Pharmacy Quality Measure Review  This patient is appearing on a report for being at risk of failing the adherence measure for cholesterol (statin) medications this calendar year.   Medication: Rosuvastatin  Last fill date: 04/22/24 for 90 day supply  Insurance report was not up to date. No action needed at this time.   Jon VEAR [place], PharmD Clinical Pharmacist [phone]

## 2024-06-04 ENCOUNTER — Other Ambulatory Visit: Payer: Self-pay | Admitting: Physician Assistant

## 2024-06-04 ENCOUNTER — Encounter: Payer: Self-pay | Admitting: Internal Medicine

## 2024-06-04 DIAGNOSIS — Z1231 Encounter for screening mammogram for malignant neoplasm of breast: Secondary | ICD-10-CM

## 2024-06-14 ENCOUNTER — Ambulatory Visit
Admission: RE | Admit: 2024-06-14 | Discharge: 2024-06-14 | Disposition: A | Discharge status: Home / Self Care | Source: Ambulatory Visit | Attending: Physician Assistant | Admitting: Physician Assistant

## 2024-06-14 DIAGNOSIS — Z1231 Encounter for screening mammogram for malignant neoplasm of breast: Secondary | ICD-10-CM | POA: Diagnosis not present

## 2024-06-21 ENCOUNTER — Other Ambulatory Visit: Payer: Self-pay | Admitting: Physician Assistant

## 2024-06-21 DIAGNOSIS — E669 Obesity, unspecified: Secondary | ICD-10-CM

## 2024-06-24 ENCOUNTER — Encounter: Payer: Self-pay | Admitting: Physician Assistant

## 2024-06-24 ENCOUNTER — Ambulatory Visit: Admitting: Physician Assistant

## 2024-06-24 VITALS — BP 124/69 | HR 86 | Temp 98.3°F | Resp 16 | Ht 62.0 in | Wt 192.0 lb

## 2024-06-24 DIAGNOSIS — E669 Obesity, unspecified: Secondary | ICD-10-CM | POA: Diagnosis not present

## 2024-06-24 DIAGNOSIS — E782 Mixed hyperlipidemia: Secondary | ICD-10-CM | POA: Diagnosis not present

## 2024-06-24 DIAGNOSIS — Z23 Encounter for immunization: Secondary | ICD-10-CM | POA: Diagnosis not present

## 2024-06-24 DIAGNOSIS — Z0000 Encounter for general adult medical examination without abnormal findings: Secondary | ICD-10-CM | POA: Diagnosis not present

## 2024-06-24 NOTE — Progress Notes (Signed)
 [facility] [address] [place], [place] [address]  Internal MEDICINE  Office Visit Note  Patient Name: Ashley Mckay  [id number]  [id number]  Date of Service: 06/24/2024  Chief Complaint  Patient presents with   Medicare Wellness   Hypertension    HPI Ashley Mckay presents for an annual well visit  Well-appearing 73 y.o.female Routine CRC screening: UTD, due in 2027 Routine mammogram: UTD Labs: reviewed, B12 low and will try oral supplement this time, but may consider restarting injections in future -Down 9lbs since last visit in person, doing well on zepbound  and will continue -husband still improving after stroke, still can't walk but is working with therapy     06/24/2024    2:58 PM 05/09/2023    8:50 AM 05/03/2022    9:13 AM  MMSE - Mini Mental State Exam  Orientation to time 5 5 5   Orientation to Place 5 5 5   Registration 3 3 3   Attention/ Calculation 5 5 5   Recall 3 3 3   Language- name 2 objects 2 2 2   Language- repeat 1 1 1   Language- follow 3 step command 3 3 3   Language- read & follow direction 1 1 1   Write a sentence 1 1 1   Copy design 1 1 1   Total score 30 30 30     Functional Status Survey: Is the patient deaf or have difficulty hearing?: No Does the patient have difficulty seeing, even when wearing glasses/contacts?: No Does the patient have difficulty concentrating, remembering, or making decisions?: No Does the patient have difficulty walking or climbing stairs?: No Does the patient have difficulty dressing or bathing?: No Does the patient have difficulty doing errands alone such as visiting a doctor's office or shopping?: No     09/19/2023    9:12 AM 11/14/2023    8:56 AM 12/12/2023   10:23 AM 01/16/2024    8:58 AM 06/24/2024    2:58 PM  Fall Risk  Falls in the past year? 0 0 0 0 0  Was there an injury with Fall?    0   Fall Risk Category Calculator    0   Patient at Risk for Falls Due to    No Fall Risks   Fall risk Follow up  Falls  evaluation completed  Falls evaluation completed        06/24/2024    2:58 PM  Depression screen PHQ 2/9  Decreased Interest 0  Down, Depressed, Hopeless 0  PHQ - 2 Score 0        No data to display            Current Medication: Outpatient Encounter Medications as of 06/24/2024  Medication Sig Note   aspirin EC 81 MG tablet Take 81 mg by mouth daily. Swallow whole.    Docusate Calcium  (STOOL SOFTENER PO) Take by mouth.    ibuprofen (ADVIL,MOTRIN) 800 MG tablet 800 mg every 8 (eight) hours as needed.  06/21/2014: Received from: External Pharmacy   levocetirizine (XYZAL ) 5 MG tablet TAKE ONE TABLET BY MOUTH DAILY IN THE EVENING    Magnesium 250 MG TABS Take by mouth.    oxybutynin  (DITROPAN -XL) 10 MG 24 hr tablet TAKE ONE TABLET BY MOUTH DAILY AT BEDTIME    propranolol  ER (INDERAL  LA) 80 MG 24 hr capsule Take 1 capsule (80 mg total) by mouth daily.    rosuvastatin  (CRESTOR ) 10 MG tablet TAKE ONE TABLET BY MOUTH ONCE A DAY    triamcinolone  cream (KENALOG ) 0.1 %  Apply 1 application topically 2 (two) times daily.    VITAMIN D , CHOLECALCIFEROL, PO Take 1,000 Int'l Units by mouth daily.    [DISCONTINUED] ondansetron  (ZOFRAN ) 4 MG tablet Take 1 tablet (4 mg total) by mouth every 8 (eight) hours as needed for nausea or vomiting.    [DISCONTINUED] ZEPBOUND  5 MG/0.5ML injection vial INJECT 0.5 ML (5 MG) UNDER THE SKIN ONCE WEEKLY (0.5ML= 50 UNITS)    No facility-administered encounter medications on file as of 06/24/2024.    Surgical History: Past Surgical History:  Procedure Laterality Date   COLONOSCOPY     COLONOSCOPY WITH PROPOFOL  N/A 01/31/2016   Procedure: COLONOSCOPY WITH PROPOFOL ;  Surgeon: Lamar ONEIDA Holmes, MD;  Location: [facility] ENDOSCOPY;  Service: Endoscopy;  Laterality: N/A;   ESOPHAGOGASTRODUODENOSCOPY     tooth implant     started in 2020 but finished dec 2021    Medical History: Past Medical History:  Diagnosis Date   Arthritis    Headache    History of  chickenpox    Hypertension    Sleep apnea     Family History: Family History  Problem Relation Age of Onset   Breast cancer Mother 74    Social History   Socioeconomic History   Marital status: Married    Spouse name: Not on file   Number of children: Not on file   Years of education: Not on file   Highest education level: Not on file  Occupational History   Not on file  Tobacco Use   Smoking status: Never   Smokeless tobacco: Never  Substance and Sexual Activity   Alcohol use: Not Currently    Comment: rarely   Drug use: No   Sexual activity: Not on file  Other Topics Concern   Not on file  Social History Narrative   Not on file   Social Drivers of Health   Financial Resource Strain: Not on file  Food Insecurity: Not on file  Transportation Needs: Not on file  Physical Activity: Not on file  Stress: Not on file  Social Connections: Not on file  Intimate Partner Violence: Not on file      Review of Systems  Constitutional:  Negative for chills, fatigue and unexpected weight change.  HENT:  Negative for congestion and rhinorrhea.   Eyes:  Negative for redness.  Respiratory:  Negative for cough, chest tightness and shortness of breath.   Cardiovascular:  Negative for chest pain and palpitations.  Gastrointestinal:  Negative for abdominal pain and constipation.  Genitourinary:  Negative for dysuria and frequency.  Musculoskeletal:  Negative for arthralgias and back pain.  Skin:  Negative for rash.  Neurological: Negative.  Negative for tremors and numbness.  Hematological:  Negative for adenopathy. Does not bruise/bleed easily.  Psychiatric/Behavioral:  Negative for behavioral problems (Depression), sleep disturbance and suicidal ideas. The patient is not nervous/anxious.     Vital Signs: BP 124/69   Pulse 86   Temp 98.3 F (36.8 C)   Resp 16   Ht 5' 2 (1.575 m)   Wt 192 lb (87.1 kg)   LMP  (LMP Unknown)   SpO2 94%   BMI 35.12 kg/m    Physical  Exam Vitals and nursing note reviewed.  Constitutional:      General: She is not in acute distress.    Appearance: She is well-developed. She is obese. She is not diaphoretic.  HENT:     Head: Normocephalic and atraumatic.  Eyes:     Extraocular Movements:  Extraocular movements intact.  Neck:     Thyroid : No thyromegaly.     Vascular: No JVD.     Trachea: No tracheal deviation.  Cardiovascular:     Rate and Rhythm: Normal rate and regular rhythm.     Heart sounds: Normal heart sounds. No murmur heard.    No friction rub. No gallop.  Pulmonary:     Effort: Pulmonary effort is normal. No respiratory distress.     Breath sounds: No wheezing.  Chest:  Breasts:    Right: Normal. No mass.     Left: Normal. No mass.  Skin:    General: Skin is warm and dry.  Neurological:     Mental Status: She is alert and oriented to person, place, and time.  Psychiatric:        Behavior: Behavior normal.        Thought Content: Thought content normal.        Judgment: Judgment normal.        Assessment/Plan: 1. Encounter for annual wellness exam in Medicare patient AWV performed, labs reviewed, mammogram and colonoscopy UTD  2. Needs flu shot (Primary) - Influenza, MDCK, trivalent, PF(Flucelvax egg-free)  3. Mixed hyperlipidemia Continue crestor    4. Obesity (BMI 30-39.9) Down 9lbs since last visit in office, may continue zepbound     General Counseling: Lakely verbalizes understanding of the findings of todays visit and agrees with plan of treatment. I have discussed any further diagnostic evaluation that may be needed or ordered today. We also reviewed her medications today. she has been encouraged to call the office with any questions or concerns that should arise related to todays visit.    Orders Placed This Encounter  Procedures   Influenza, MDCK, trivalent, PF(Flucelvax egg-free)    No orders of the defined types were placed in this encounter.   Return in about 2  months (around 08/24/2024) for wt loss.   Total time spent:35 Minutes Time spent includes review of chart, medications, test results, and follow up plan with the patient.   [place] Controlled Substance Database was reviewed by me.  This patient was seen by Tinnie Pro, PA-C in collaboration with Dr. Sigrid Bathe as a part of collaborative care agreement.  Tinnie Pro, PA-C Internal medicine

## 2024-07-27 ENCOUNTER — Other Ambulatory Visit: Payer: Self-pay

## 2024-07-27 ENCOUNTER — Telehealth: Payer: Self-pay

## 2024-07-27 DIAGNOSIS — E782 Mixed hyperlipidemia: Secondary | ICD-10-CM

## 2024-07-27 MED ORDER — ROSUVASTATIN CALCIUM 10 MG PO TABS: 10.0000 mg | ORAL_TABLET | Freq: Every day | ORAL | 0 refills | Status: AC

## 2024-07-27 NOTE — Progress Notes (Signed)
   07/27/2024  Patient ID: Loa GORMAN Masters, female   DOB: 1950/12/10, 73 y.o.   MRN: [id number]  Pharmacy Quality Measure Review  This patient is appearing on a report for being at risk of failing the adherence measure for cholesterol (statin) medications this calendar year.   Medication: Rosuvastatin  10mg  Last fill date: 04/22/24 for 90 day supply  Will collaborate with provider to facilitate refill needs.   Jon VEAR Lindau, PharmD Clinical Pharmacist [phone]

## 2024-07-29 ENCOUNTER — Other Ambulatory Visit: Payer: Self-pay | Admitting: Internal Medicine

## 2024-07-29 DIAGNOSIS — J301 Allergic rhinitis due to pollen: Secondary | ICD-10-CM

## 2024-07-29 DIAGNOSIS — R32 Unspecified urinary incontinence: Secondary | ICD-10-CM

## 2024-08-26 ENCOUNTER — Other Ambulatory Visit: Payer: Self-pay | Admitting: Physician Assistant

## 2024-08-26 ENCOUNTER — Encounter: Payer: Self-pay | Admitting: Physician Assistant

## 2024-08-26 ENCOUNTER — Ambulatory Visit: Admitting: Physician Assistant

## 2024-08-26 VITALS — BP 131/76 | HR 74 | Temp 98.0°F | Resp 16 | Ht 62.0 in | Wt 185.8 lb

## 2024-08-26 DIAGNOSIS — E669 Obesity, unspecified: Secondary | ICD-10-CM | POA: Diagnosis not present

## 2024-08-26 DIAGNOSIS — F5104 Psychophysiologic insomnia: Secondary | ICD-10-CM

## 2024-08-26 DIAGNOSIS — G4733 Obstructive sleep apnea (adult) (pediatric): Secondary | ICD-10-CM

## 2024-08-26 MED ORDER — ZEPBOUND 5 MG/0.5ML ~~LOC~~ SOLN: 5.0000 mg | SUBCUTANEOUS | 2 refills | Status: DC

## 2024-08-26 MED ORDER — TRAZODONE HCL 50 MG PO TABS: 25.0000 mg | ORAL_TABLET | Freq: Every evening | ORAL | 3 refills | Status: AC | PRN

## 2024-08-26 NOTE — Progress Notes (Unsigned)
 [facility] [address] [place], [place] [address]  Internal MEDICINE  Office Visit Note  Patient Name: Ashley Mckay  [id number]  [id number]  Date of Service: 08/26/2024  Chief Complaint  Patient presents with   Follow-up   Weight Loss   Hypertension    HPI Pt is here for routine follow up -Doing well with zepbound , down 7lbs since last visit. 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 Swallow whole.    Docusate Calcium  (STOOL SOFTENER PO) Take by mouth.    ibuprofen (ADVIL,MOTRIN) 800 MG tablet 800 mg every 8 (eight) hours as needed.  06/21/2014: Received from: External Pharmacy   levocetirizine (XYZAL ) 5 MG tablet TAKE ONE TABLET BY MOUTH DAILY IN THE EVENING    Magnesium 250 MG TABS Take by mouth.    oxybutynin  (DITROPAN -XL) 10 MG 24 hr tablet TAKE ONE TABLET BY MOUTH DAILY AT BEDTIME    propranolol  ER (INDERAL  LA) 80 MG 24 hr capsule Take 1 capsule (80 mg total) by mouth daily.    rosuvastatin  (CRESTOR ) 10 MG tablet Take 1 tablet (10 mg total) by mouth daily.    triamcinolone  cream (KENALOG ) 0.1 % Apply 1 application topically 2 (two) times daily.    VITAMIN D , CHOLECALCIFEROL, PO Take 1,000 Int'l Units by mouth daily.    ZEPBOUND  5 MG/0.5ML injection vial INJECT 0.5 ML (5 MG) UNDER THE SKIN ONCE WEEKLY (0.5ML= 50 UNITS)    No facility-administered encounter medications on file as of 08/26/2024.    Surgical History: Past Surgical History:  Procedure Laterality Date   COLONOSCOPY     COLONOSCOPY WITH PROPOFOL  N/A 01/31/2016   Procedure: COLONOSCOPY WITH PROPOFOL ;  Surgeon: Lamar ONEIDA Holmes, MD;  Location: [facility] ENDOSCOPY;  Service: Endoscopy;  Laterality: N/A;    ESOPHAGOGASTRODUODENOSCOPY     tooth implant     started in 2020 but finished dec 2021    Medical History: Past Medical History:  Diagnosis Date   Arthritis    Headache    History of chickenpox    Hypertension    Sleep apnea     Family History: Family History  Problem Relation Age of Onset   Breast cancer Mother 24    Social History   Socioeconomic History   Marital status: Married    Spouse name: Not on file   Number of children: Not on file   Years of education: Not on file   Highest education level: Not on file  Occupational History   Not on file  Tobacco Use   Smoking status: Never   Smokeless tobacco: Never  Substance and Sexual Activity   Alcohol use: Not Currently    Comment: rarely   Drug use: No   Sexual activity: Not on file  Other Topics Concern   Not on file  Social History Narrative   Not on file   Social Drivers of Health   Tobacco Use: Low Risk (08/26/2024)   Patient History    Smoking Tobacco Use: Never    Smokeless Tobacco Use: Never    Passive Exposure: Not on file  Financial Resource Strain: Not on file  Food Insecurity: Not on file  Transportation Needs: Not on file  Physical Activity: Not on file  Stress: Not on file  Social Connections: Not on file  Intimate Partner Violence: Not on file  Depression (PHQ2-9): Low Risk (08/26/2024)   Depression (PHQ2-9)    PHQ-2 Score: 0  Alcohol Screen: Not on file  Housing: Not on file  Utilities: Not on file  Health Literacy: Not on file      Review of Systems  Constitutional:  Negative for chills, fatigue and unexpected weight change.  HENT:  Negative for congestion and rhinorrhea.   Eyes:  Negative for redness.  Respiratory:  Negative for cough, chest tightness and shortness of breath.   Cardiovascular:  Negative for chest pain and palpitations.  Gastrointestinal:  Negative for abdominal pain and constipation.  Genitourinary:  Negative for dysuria and frequency.  Musculoskeletal:   Negative for arthralgias and back pain.  Skin:  Negative for rash.  Neurological: Negative.  Negative for tremors and numbness.  Hematological:  Negative for adenopathy. Does not bruise/bleed easily.  Psychiatric/Behavioral:  Positive for sleep disturbance. Negative for behavioral problems (Depression) and suicidal ideas. The patient is not nervous/anxious.     Vital Signs: BP 131/76   Pulse 74   Temp 98 F (36.7 C)   Resp 16   Ht 5' 2 (1.575 m)   Wt 185 lb 12.8 oz (84.3 kg)   LMP  (LMP Unknown)   SpO2 95%   BMI 33.98 kg/m    Physical Exam Vitals and nursing note reviewed.  Constitutional:      General: She is not in acute distress.    Appearance: She is well-developed. She is obese. She is not diaphoretic.  HENT:     Head: Normocephalic and atraumatic.  Eyes:     Extraocular Movements: Extraocular movements intact.  Neck:     Thyroid : No thyromegaly.     Vascular: No JVD.     Trachea: No tracheal deviation.  Cardiovascular:     Rate and Rhythm: Normal rate and regular rhythm.     Heart sounds: Normal heart sounds. No murmur heard.    No friction rub. No gallop.  Pulmonary:     Effort: Pulmonary effort is normal. No respiratory distress.     Breath sounds: No wheezing.  Chest:  Breasts:    Right: Normal. No mass.     Left: Normal. No mass.  Skin:    General: Skin is warm and dry.  Neurological:     Mental Status: She is alert and oriented to person, place, and time.  Psychiatric:        Behavior: Behavior normal.        Thought Content: Thought content normal.        Judgment: Judgment normal.        Assessment/Plan: 1. OSA (obstructive sleep apnea) (Primary) Will send to local pharmacy for OSA treatment, but if not covered still then patient would like sent back to [facility] and will continue to pay OOP for this - tirzepatide  (ZEPBOUND ) 5 MG/0.5ML injection vial; Inject 5 mg into the skin once a week.  Dispense: 2 mL; Refill: 2  2. Psychophysiological  insomnia Will trial 1/2-1 tab trazodone  to help sleep - traZODone  (DESYREL ) 50 MG tablet; Take 0.5-1 tablets (25-50 mg total) by mouth at bedtime as needed for sleep.  Dispense: 30 tablet; Refill: 3  3. Obesity (BMI 30-39.9) Down 7 lbs since last visit and will continue zepbound    General Counseling: Karrin verbalizes understanding of the findings of todays visit and agrees with plan  of treatment. I have discussed any further diagnostic evaluation that may be needed or ordered today. We also reviewed her medications today. she has been encouraged to call the office with any questions or concerns that should arise related to todays visit.    No orders of the defined types were placed in this encounter.   No orders of the defined types were placed in this encounter.   This patient was seen by Tinnie Pro, PA-C in collaboration with Dr. Sigrid Bathe as a part of collaborative care agreement.   Total time spent:30 Minutes Time spent includes review of chart, medications, test results, and follow up plan with the patient.      Dr Fozia M Khan Internal medicine

## 2024-08-30 ENCOUNTER — Telehealth: Payer: Self-pay

## 2024-08-30 ENCOUNTER — Other Ambulatory Visit: Payer: Self-pay | Admitting: Physician Assistant

## 2024-08-30 DIAGNOSIS — G4733 Obstructive sleep apnea (adult) (pediatric): Secondary | ICD-10-CM

## 2024-08-30 NOTE — Telephone Encounter (Signed)
 Completed PA for patient's Zepbound .

## 2024-09-07 ENCOUNTER — Other Ambulatory Visit: Payer: Self-pay | Admitting: Physician Assistant

## 2024-09-07 DIAGNOSIS — G4733 Obstructive sleep apnea (adult) (pediatric): Secondary | ICD-10-CM

## 2024-09-10 ENCOUNTER — Other Ambulatory Visit: Payer: Self-pay | Admitting: Physician Assistant

## 2024-09-10 ENCOUNTER — Telehealth: Payer: Self-pay

## 2024-09-10 DIAGNOSIS — G4733 Obstructive sleep apnea (adult) (pediatric): Secondary | ICD-10-CM

## 2024-09-10 MED ORDER — ZEPBOUND 5 MG/0.5ML ~~LOC~~ SOAJ: 5.0000 mg | SUBCUTANEOUS | 2 refills | Status: DC

## 2024-09-10 NOTE — Telephone Encounter (Signed)
 Ok thanks, I resent to [employer] for it

## 2024-09-16 ENCOUNTER — Other Ambulatory Visit: Payer: Self-pay | Admitting: Physician Assistant

## 2024-09-16 DIAGNOSIS — E669 Obesity, unspecified: Secondary | ICD-10-CM

## 2024-09-16 MED ORDER — TIRZEPATIDE-WEIGHT MANAGEMENT 5 MG/0.5ML ~~LOC~~ SOLN: 5.0000 mg | SUBCUTANEOUS | 2 refills | Status: AC

## 2024-09-17 ENCOUNTER — Other Ambulatory Visit: Payer: Self-pay | Admitting: Physician Assistant

## 2024-09-17 DIAGNOSIS — F5104 Psychophysiologic insomnia: Secondary | ICD-10-CM

## 2024-09-20 ENCOUNTER — Telehealth: Payer: Self-pay

## 2024-09-20 ENCOUNTER — Other Ambulatory Visit: Payer: Self-pay | Admitting: Physician Assistant

## 2024-09-20 DIAGNOSIS — F411 Generalized anxiety disorder: Secondary | ICD-10-CM

## 2024-09-20 DIAGNOSIS — F5104 Psychophysiologic insomnia: Secondary | ICD-10-CM

## 2024-09-20 MED ORDER — ALPRAZOLAM 0.25 MG PO TABS: 0.2500 mg | ORAL_TABLET | Freq: Two times a day (BID) | ORAL | 0 refills | Status: AC | PRN

## 2024-09-20 NOTE — Telephone Encounter (Signed)
 Low dose xanax  sent to [facility] to be taken as needed. Caution on S/E including drowsiness.

## 2024-09-21 NOTE — Telephone Encounter (Signed)
 done

## 2024-10-12 ENCOUNTER — Ambulatory Visit: Payer: PPO | Admitting: Nurse Practitioner

## 2024-10-12 ENCOUNTER — Ambulatory Visit: Admitting: Internal Medicine

## 2024-10-18 ENCOUNTER — Ambulatory Visit: Admitting: Internal Medicine

## 2024-10-28 ENCOUNTER — Ambulatory Visit: Admitting: Physician Assistant

## 2025-06-27 ENCOUNTER — Ambulatory Visit: Admitting: Physician Assistant
